# Patient Record
Sex: Male | Born: 1980 | Race: White | Hispanic: No | Marital: Married
Health system: Midwestern US, Academic
[De-identification: ages and names within clinical notes are randomized; demographics above are authoritative.]

---

## 2019-09-19 ENCOUNTER — Encounter: Admit: 2019-09-19 | Discharge: 2019-09-19 | Payer: BC Managed Care – PPO

## 2019-09-19 ENCOUNTER — Ambulatory Visit: Admit: 2019-09-19 | Discharge: 2019-09-19 | Payer: BC Managed Care – PPO

## 2019-09-19 DIAGNOSIS — R1031 Right lower quadrant pain: Secondary | ICD-10-CM

## 2019-09-19 DIAGNOSIS — E119 Type 2 diabetes mellitus without complications: Secondary | ICD-10-CM

## 2019-09-19 DIAGNOSIS — K5090 Crohn's disease, unspecified, without complications: Secondary | ICD-10-CM

## 2019-09-19 DIAGNOSIS — Z20822 Encounter for screening laboratory testing for COVID-19 virus in asymptomatic patient: Secondary | ICD-10-CM

## 2019-09-19 DIAGNOSIS — M797 Fibromyalgia: Secondary | ICD-10-CM

## 2019-09-19 NOTE — Patient Instructions
-   Get labwork completed along with MRI abdomen  - We will call to schedule EGD and colonoscopy (upper and lower scopes)  - Continue bentyl as needed

## 2019-09-21 ENCOUNTER — Encounter: Admit: 2019-09-21 | Discharge: 2019-09-21 | Payer: BC Managed Care – PPO

## 2019-09-21 MED ORDER — PLENVU 140-9-5.2 GRAM PO PPKS
1 | PACK | ORAL | 0 refills | Status: AC
Start: 2019-09-21 — End: ?

## 2019-09-21 NOTE — Telephone Encounter
Review of AVS with patient.  Labs to be drawn locally and mailed orders to patient today.  Order for COVID-19 nasal swab mailed with notation to call [phone] if unable to obtain locally.    Colonoscopy prep instructions mailed to patient.

## 2019-10-01 ENCOUNTER — Encounter: Admit: 2019-10-01 | Discharge: 2019-10-01 | Payer: BC Managed Care – PPO

## 2019-10-02 ENCOUNTER — Encounter: Admit: 2019-10-02 | Discharge: 2019-10-02 | Payer: BC Managed Care – PPO

## 2019-10-02 NOTE — Telephone Encounter
Patient request to our office to have "pre-med" for contrast allergy prior to MR Enterography.

## 2019-10-03 MED ORDER — METHYLPREDNISOLONE 4 MG PO DSPK
ORAL_TABLET | 0 refills | Status: AC
Start: 2019-10-03 — End: ?

## 2019-10-20 MED ORDER — CIMZIA 400 MG/2 ML (200 MG/ML X 2) SC SYKT
400 mg | SUBCUTANEOUS | 5 refills | 28.00000 days | Status: DC
Start: 2019-10-20 — End: 2019-12-14
  Filled 2019-10-24: qty 2, 28d supply, fill #1

## 2019-10-31 ENCOUNTER — Encounter: Admit: 2019-10-31 | Discharge: 2019-10-31 | Payer: Commercial Managed Care - Pharmacy Benefit Manager

## 2019-11-03 ENCOUNTER — Encounter: Admit: 2019-11-03 | Discharge: 2019-11-03 | Payer: Commercial Managed Care - Pharmacy Benefit Manager

## 2019-11-03 DIAGNOSIS — M797 Fibromyalgia: Secondary | ICD-10-CM

## 2019-11-03 DIAGNOSIS — R12 Heartburn: Secondary | ICD-10-CM

## 2019-11-03 DIAGNOSIS — D751 Secondary polycythemia: Secondary | ICD-10-CM

## 2019-11-03 DIAGNOSIS — E039 Hypothyroidism, unspecified: Secondary | ICD-10-CM

## 2019-11-03 DIAGNOSIS — E785 Hyperlipidemia, unspecified: Secondary | ICD-10-CM

## 2019-11-03 DIAGNOSIS — K5090 Crohn's disease, unspecified, without complications: Secondary | ICD-10-CM

## 2019-11-03 DIAGNOSIS — K219 Gastro-esophageal reflux disease without esophagitis: Secondary | ICD-10-CM

## 2019-11-03 DIAGNOSIS — E119 Type 2 diabetes mellitus without complications: Secondary | ICD-10-CM

## 2019-11-05 ENCOUNTER — Encounter: Admit: 2019-11-05 | Discharge: 2019-11-05 | Payer: Commercial Managed Care - Pharmacy Benefit Manager

## 2019-11-05 ENCOUNTER — Ambulatory Visit: Admit: 2019-11-05 | Discharge: 2019-11-05 | Payer: BC Managed Care – PPO

## 2019-11-05 MED ORDER — BREEZA FOR NEUTRAL ABDOMINAL/PELVIC IMAGING PO SOLN
1500 mL | Freq: Once | ORAL | 0 refills | Status: CP
Start: 2019-11-05 — End: ?

## 2019-11-05 MED ORDER — GADOBUTROL 10 MMOL/10 ML (1 MMOL/ML) IV SOLN
9 mL | Freq: Once | INTRAVENOUS | 0 refills | Status: CP
Start: 2019-11-05 — End: ?
  Administered 2019-11-05: 19:00:00 9 mL via INTRAVENOUS

## 2019-11-05 MED ORDER — SODIUM CHLORIDE 0.9 % IJ SOLN
50 mL | Freq: Once | INTRAVENOUS | 0 refills | Status: CP
Start: 2019-11-05 — End: ?

## 2019-11-07 ENCOUNTER — Encounter: Admit: 2019-11-07 | Discharge: 2019-11-07 | Payer: Commercial Managed Care - Pharmacy Benefit Manager

## 2019-11-10 ENCOUNTER — Encounter: Admit: 2019-11-10 | Discharge: 2019-11-11 | Payer: BC Managed Care – PPO

## 2019-11-10 DIAGNOSIS — Z20822 Encounter for screening laboratory testing for COVID-19 virus in asymptomatic patient: Secondary | ICD-10-CM

## 2019-11-12 ENCOUNTER — Encounter: Admit: 2019-11-12 | Discharge: 2019-11-12 | Payer: Commercial Managed Care - Pharmacy Benefit Manager

## 2019-11-12 MED ORDER — PROMETHAZINE 25 MG/ML IJ SOLN
6.25 mg | INTRAVENOUS | 0 refills | Status: CN | PRN
Start: 2019-11-12 — End: ?

## 2019-11-12 MED ORDER — DIPHENHYDRAMINE HCL 50 MG/ML IJ SOLN
12.5-25 mg | Freq: Once | INTRAVENOUS | 0 refills | Status: CN | PRN
Start: 2019-11-12 — End: ?

## 2019-11-12 MED ORDER — ONDANSETRON HCL (PF) 4 MG/2 ML IJ SOLN
4 mg | Freq: Once | INTRAVENOUS | 0 refills | Status: CN | PRN
Start: 2019-11-12 — End: ?

## 2019-11-12 MED ORDER — MEPERIDINE (PF) 25 MG/ML IJ SYRG
12.5 mg | INTRAVENOUS | 0 refills | Status: CN | PRN
Start: 2019-11-12 — End: ?

## 2019-11-13 ENCOUNTER — Encounter: Admit: 2019-11-13 | Discharge: 2019-11-13 | Payer: Commercial Managed Care - Pharmacy Benefit Manager

## 2019-11-13 DIAGNOSIS — E039 Hypothyroidism, unspecified: Secondary | ICD-10-CM

## 2019-11-13 DIAGNOSIS — D751 Secondary polycythemia: Secondary | ICD-10-CM

## 2019-11-13 DIAGNOSIS — E119 Type 2 diabetes mellitus without complications: Secondary | ICD-10-CM

## 2019-11-13 DIAGNOSIS — K5090 Crohn's disease, unspecified, without complications: Secondary | ICD-10-CM

## 2019-11-13 DIAGNOSIS — E785 Hyperlipidemia, unspecified: Secondary | ICD-10-CM

## 2019-11-13 DIAGNOSIS — M797 Fibromyalgia: Secondary | ICD-10-CM

## 2019-11-13 DIAGNOSIS — K219 Gastro-esophageal reflux disease without esophagitis: Secondary | ICD-10-CM

## 2019-11-13 DIAGNOSIS — R12 Heartburn: Secondary | ICD-10-CM

## 2019-11-13 MED ORDER — PROPOFOL 10 MG/ML IV EMUL 50 ML (INFUSION)(AM)(OR)
INTRAVENOUS | 0 refills | Status: DC
Start: 2019-11-13 — End: 2019-11-13

## 2019-11-13 MED ORDER — LIDOCAINE (PF) 10 MG/ML (1 %) IJ SOLN
.2 mL | INTRAMUSCULAR | 0 refills | Status: DC | PRN
Start: 2019-11-13 — End: 2019-11-18

## 2019-11-13 MED ORDER — GLYCOPYRROLATE 0.2 MG/ML IJ SOLN
INTRAVENOUS | 0 refills | Status: DC
Start: 2019-11-13 — End: 2019-11-13

## 2019-11-13 MED ORDER — LACTATED RINGERS IV SOLP
1000 mL | INTRAVENOUS | 0 refills | Status: DC
Start: 2019-11-13 — End: 2019-11-18

## 2019-11-13 NOTE — Anesthesia Pre-Procedure Evaluation
Anesthesia Pre-Procedure Evaluation    Name: Alan [name]      MRN: [id number]     DOB: [birth year]     Age: 39 y.o.     Sex: male   _________________________________________________________________________     Procedure Info:   Procedure Information     Date/Time: 11/13/19 1045    Procedures:       Colonoscopy (N/A )      ESOPHAGOGASTRODUODENOSCOPY WITH BIOPSY - FLEXIBLE (N/A )    Location: [facility] / [id number] OR    Surgeons: Buckles, Vinnie Level, MD          Physical Assessment  Vital Signs (last filed in past 24 hours):         Patient History   Allergies   Allergen Reactions   ? Azathioprine RASH   ? Chlorhexidine RASH   ? Iodine And Iodide Containing Products RASH   ? Chlorphen-Phenyleph-Hydrocodon ITCHING   ? Gadolinium-Containing Contrast Media FLUSHING (SKIN)     Pt reported has had reaction in past x2 at other location. Pt reports was given benadryl with 2nd reaction.      ? Hydrocodone UNKNOWN        Current Medications    Medication Directions   BD ULTRA-FINE SHORT PEN NEEDLE 31 gauge x 5/16 pen needle USE TO INJECT INSULIN 4 TIMES A DAY   cetirizine (ZYRTEC) 10 mg tablet Daily   CIMZIA 400 mg/2 mL (200 mg/mL x 2) syringe Inject 2 mL under the skin every 28 days. Indications: Crohn's disease   dicyclomine (BENTYL) 20 mg tablet TAKE 1 TABLET BY MOUTH 4 TIMES A DAY AS NEEDED FOR BOWEL CRAMPING   diphenhydrAMINE (BENADRYL) 25 mg capsule 1 Cap, QID, PO, PRN, Allergy   FARXIGA 10 mg tablet Take 10 mg by mouth every morning.   HUMALOG KWIKPEN INSULIN 100 unit/mL injection PEN INJECT 20 UNITS SUB Q 3 TIMES DAILY WITH MEALS   HYDROcodone/acetaminophen (NORCO) 5/325 mg tablet TAKE 1 TO 2 TABLETS BY MOUTH EVERY 6 HOURS AS NEEDED FOR PAIN   LANTUS SOLOSTAR U-100 INSULIN 100 unit/mL (3 mL) injection PEN INJECT 50 UNITS SUB Q DAILY AT BEDTIME   levothyroxine (SYNTHROID) 25 mcg tablet    methylPREDNIsolone (MEDROL DOSEPAK) 4 mg tablet 32mg  by mouth 12 hours prior to MRI and 32mg  by mouth 2 hours prior to MRI.  And over the counter Benadryl 50mg  2 hours prior to scan.  MRI requires you to have a driver.   pantoprazole DR (PROTONIX) 40 mg tablet Take 40 mg by mouth daily.   peg3350-sod sul-NaCl-KCl-asb-C (PLENVU) 140-9-5.2 gram ppks Take 1 each by mouth as directed. Split dose   rosuvastatin (CRESTOR) 40 mg tablet Take 40 mg by mouth at bedtime daily.   SAVELLA 50 mg tablet    VASCEPA 1 gram capsule TAKE 2 CAPSULES BY MOUTH TWICE A DAY         Review of Systems/Medical History        PONV Screening: Non-smoker      Airway - negative        Pulmonary - negative          Cardiovascular - negative        Exercise tolerance: >4 METS      Beta Blocker therapy: No      Beta blockers within 24 hours: n/a      GI/Hepatic/Renal       Inflammatory bowel disease      GERD,  Bowel prep      Neuro/Psych - negative          Endocrine/Other       Diabetes    Constitution - negative   PHYSICAL EXAM     Diagnostic Tests  Hematology: No results found for: HGB, HCT, PLTCT, WBC, NEUT, ANC, LYMPH, ALC, ABSLYMPHCT, MONA, AMC, EOSA, ABC, BASOPHILS, MCV, MCH, MCHC, MPV, RDW      General Chemistry: No results found for: NA, K, CL, CO2, GAP, BUN, CR, GLU, CA, KETONES, ALBUMIN, LACTIC, OBSCA, MG, TOTBILI, TOTBILCB, PO4   Coagulation: No results found for: PT, PTT, INR      Anesthesia Plan    ASA score: 2   Plan: MAC      Informed Consent        Plan discussed with: anesthesiologist, CRNA and surgeon/proceduralist.

## 2019-11-14 ENCOUNTER — Encounter: Admit: 2019-11-14 | Discharge: 2019-11-14 | Payer: Commercial Managed Care - Pharmacy Benefit Manager

## 2019-11-14 DIAGNOSIS — E039 Hypothyroidism, unspecified: Secondary | ICD-10-CM

## 2019-11-14 DIAGNOSIS — R12 Heartburn: Secondary | ICD-10-CM

## 2019-11-14 DIAGNOSIS — K5090 Crohn's disease, unspecified, without complications: Secondary | ICD-10-CM

## 2019-11-14 DIAGNOSIS — E785 Hyperlipidemia, unspecified: Secondary | ICD-10-CM

## 2019-11-14 DIAGNOSIS — D751 Secondary polycythemia: Secondary | ICD-10-CM

## 2019-11-14 DIAGNOSIS — K219 Gastro-esophageal reflux disease without esophagitis: Secondary | ICD-10-CM

## 2019-11-14 DIAGNOSIS — E119 Type 2 diabetes mellitus without complications: Secondary | ICD-10-CM

## 2019-11-14 DIAGNOSIS — M797 Fibromyalgia: Secondary | ICD-10-CM

## 2019-11-17 ENCOUNTER — Encounter: Admit: 2019-11-17 | Discharge: 2019-11-17 | Payer: Commercial Managed Care - Pharmacy Benefit Manager

## 2019-11-17 MED FILL — CIMZIA 400 MG/2 ML (200 MG/ML X 2) SC SYKT: 400 mg/2 mL (200 mg/mL x 2) | SUBCUTANEOUS | 28 days supply | Qty: 2 | Fill #2 | Status: AC

## 2019-11-29 ENCOUNTER — Encounter: Admit: 2019-11-29 | Discharge: 2019-11-29 | Payer: Commercial Managed Care - Pharmacy Benefit Manager

## 2019-11-29 DIAGNOSIS — K5090 Crohn's disease, unspecified, without complications: Secondary | ICD-10-CM

## 2019-11-29 DIAGNOSIS — Z5181 Encounter for therapeutic drug level monitoring: Secondary | ICD-10-CM

## 2019-11-29 NOTE — Telephone Encounter
-----   Message from Elmo Putt, MD sent at 11/28/2019  3:45 PM CDT -----  Spoke with the patient. He continues to have symptoms of abdominal pain, primarily. We discussed his endoscopy and pathology results. We spoke about transitioning him to another class of biologic medication, particularly stelara. He is agreeable. He would need HbsAb, TB spot, CXR. He would like to get labwork and tests done at [facility] if possible. He is also in the middle of transitioning insurance and is concerned about coverage/payment for the tests and medications until his new insurance takes over in 1-2 months.  ----- Message -----  From: Buckles, Vinnie Level, MD  Sent: 11/17/2019  12:17 PM CDT  To: Myrlene Broker, RN, Elmo Putt, MD    Biopsies show some active Ileitis    Dr. Romeo Apple,  Pt has follow up appointment. Do you want to discuss with him switching to Stelara? It seems he has ost response to Humira and Cimzia.

## 2019-11-29 NOTE — Telephone Encounter
Faxed to [facility] [phone]

## 2019-11-29 NOTE — Telephone Encounter
Patient aware of efforts in sending all lab orders and CXR order to [facility] in [place], [place]  Fax is [phone].  Patient does not plan on having completed until can get confirmation regarding Wife's new insurance anticipating 1 1/2 - 2 months from now.  Asked for patient to let us know once completed to ensure we get results.

## 2019-12-13 ENCOUNTER — Encounter: Admit: 2019-12-13 | Discharge: 2019-12-13 | Payer: PRIVATE HEALTH INSURANCE

## 2019-12-14 ENCOUNTER — Encounter: Admit: 2019-12-14 | Discharge: 2019-12-14 | Payer: PRIVATE HEALTH INSURANCE

## 2019-12-14 MED ORDER — CIMZIA 400 MG/2 ML (200 MG/ML X 2) SC SYKT
400 mg | SUBCUTANEOUS | 5 refills | 28.00000 days | Status: AC
Start: 2019-12-14 — End: ?

## 2019-12-14 NOTE — Progress Notes
Alan [name] specialty medication Alan [name] has been approved but is mandated to [employer] by their insurance. A new prescription has been sent and will be routed to provider for cosignature.    Called patient and discussed the above. His next fill is due ~6/28. He will plan on contact [facility] soon to arrange the fill.     Mliss Fritz, PHARMD

## 2019-12-14 NOTE — Progress Notes
Johnmatthew Silsby's prescription for Cimzia is not able to be filled by [facility] due to the patient's prescription insurance.  Pharmacy has contacted Terese Door to notify them that their prescription will be sent to [facility] ([phone]) as required by their prescription insurance.  The specialty pharmacy team has also notified the ambulatory clinical pharmacist by e-mail.    Nena Jordan  Pharmacy Patient Advocate  [phone]

## 2019-12-18 ENCOUNTER — Encounter: Admit: 2019-12-18 | Discharge: 2019-12-18 | Payer: PRIVATE HEALTH INSURANCE

## 2019-12-19 ENCOUNTER — Encounter: Admit: 2019-12-19 | Discharge: 2019-12-19 | Payer: PRIVATE HEALTH INSURANCE

## 2019-12-19 MED ORDER — DICYCLOMINE 20 MG PO TAB
20 mg | ORAL_TABLET | Freq: Four times a day (QID) | ORAL | 0 refills | Status: DC | PRN
Start: 2019-12-19 — End: 2020-01-10

## 2019-12-19 MED ORDER — CERTOLIZUMAB PEGOL 400 MG/2 ML (200 MG/ML X 2) SC SYKT
400 mg | SUBCUTANEOUS | 3 refills | Status: CN
Start: 2019-12-19 — End: ?

## 2019-12-19 NOTE — Progress Notes
Per O2 message from patient, [facility] stating that PA is needed for Cimzia. [facility] attempted to process PA earlier this month with insurance but was told this wasn't needed.     Will send new Rx to [facility] to trouble shoot and attempt another PA.    Mliss Fritz, PHARMD

## 2019-12-19 NOTE — Progress Notes
The Prior Authorization for Cimzia was submitted for Alan [name] via Cover My Meds.  Will continue to follow.    Nena Jordan  Pharmacy Patient Advocate  [phone]

## 2019-12-19 NOTE — Progress Notes
The Prior Authorization for Cimzia was approved for Alan [name] from 12/19/2019 to 12/18/2020. The PA authorization number is [id number].    Alan [name]'s prescription for Cimzia is not able to be filled by [facility] due to the patient's prescription insurance.  Pharmacy has contacted Terese Door to notify them that their prescription will be sent to [facility] [phone]) as required by their prescription insurance.  The specialty pharmacy team has also notified the ambulatory clinical pharmacist by e-mail.    Nena Jordan  Pharmacy Patient Advocate  [phone]

## 2019-12-26 ENCOUNTER — Encounter: Admit: 2019-12-26 | Discharge: 2019-12-26 | Payer: Commercial Managed Care - HMO

## 2019-12-26 ENCOUNTER — Ambulatory Visit: Admit: 2019-12-26 | Discharge: 2019-12-27 | Payer: Commercial Managed Care - HMO

## 2019-12-26 DIAGNOSIS — M797 Fibromyalgia: Secondary | ICD-10-CM

## 2019-12-26 DIAGNOSIS — E039 Hypothyroidism, unspecified: Secondary | ICD-10-CM

## 2019-12-26 DIAGNOSIS — K5090 Crohn's disease, unspecified, without complications: Secondary | ICD-10-CM

## 2019-12-26 DIAGNOSIS — E785 Hyperlipidemia, unspecified: Secondary | ICD-10-CM

## 2019-12-26 DIAGNOSIS — R12 Heartburn: Secondary | ICD-10-CM

## 2019-12-26 DIAGNOSIS — E119 Type 2 diabetes mellitus without complications: Secondary | ICD-10-CM

## 2019-12-26 DIAGNOSIS — K219 Gastro-esophageal reflux disease without esophagitis: Secondary | ICD-10-CM

## 2019-12-26 DIAGNOSIS — D751 Secondary polycythemia: Secondary | ICD-10-CM

## 2019-12-26 MED ORDER — BUDESONIDE 3 MG PO CECX
9 mg | ORAL_CAPSULE | Freq: Every morning | ORAL | 0 refills | 30.00000 days | Status: AC
Start: 2019-12-26 — End: ?

## 2019-12-26 NOTE — Patient Instructions
-   let us know when you get labs and chest x ray  - entocort (budesonide) has been sent to your pharmacy. Take 9mg  daily for 8 weeks

## 2020-01-05 ENCOUNTER — Encounter: Admit: 2020-01-05 | Discharge: 2020-01-05 | Payer: Commercial Managed Care - HMO

## 2020-01-10 ENCOUNTER — Encounter: Admit: 2020-01-10 | Discharge: 2020-01-10 | Payer: Commercial Managed Care - HMO

## 2020-01-10 MED ORDER — DICYCLOMINE 20 MG PO TAB
20 mg | ORAL_TABLET | Freq: Four times a day (QID) | ORAL | 0 refills | Status: AC | PRN
Start: 2020-01-10 — End: ?

## 2020-01-16 ENCOUNTER — Encounter: Admit: 2020-01-16 | Discharge: 2020-01-16 | Payer: Commercial Managed Care - HMO

## 2020-01-31 ENCOUNTER — Encounter: Admit: 2020-01-31 | Discharge: 2020-01-31 | Payer: Commercial Managed Care - HMO

## 2020-01-31 NOTE — Telephone Encounter
Request for 01/29/2020 chest x-ray, hepatitis serology, t spot from [facility] per patient message to our office had completed.

## 2020-02-01 ENCOUNTER — Encounter: Admit: 2020-02-01 | Discharge: 2020-02-01 | Payer: Commercial Managed Care - HMO

## 2020-02-01 DIAGNOSIS — K5090 Crohn's disease, unspecified, without complications: Secondary | ICD-10-CM

## 2020-02-01 DIAGNOSIS — Z5181 Encounter for therapeutic drug level monitoring: Secondary | ICD-10-CM

## 2020-02-01 LAB — T SPOT TB (QUANTIFERON TB)

## 2020-02-01 MED ORDER — USTEKINUMAB 90 MG/ML SC SYRG
90 mg | SUBCUTANEOUS | 6 refills | Status: CN
Start: 2020-02-01 — End: ?

## 2020-02-01 MED ORDER — USTEKINUMAB IVPB
520 mg | Freq: Once | INTRAVENOUS | 0 refills
Start: 2020-02-01 — End: ?

## 2020-02-02 ENCOUNTER — Encounter: Admit: 2020-02-02 | Discharge: 2020-02-02 | Payer: Commercial Managed Care - HMO

## 2020-02-02 MED ORDER — FLUCONAZOLE 100 MG PO TAB
100 mg | ORAL_TABLET | Freq: Every day | ORAL | 0 refills | 7.00000 days | Status: AC
Start: 2020-02-02 — End: ?

## 2020-02-02 NOTE — Progress Notes
The Prior Authorization for Alan [name] was approved for Alan [name] from 02/02/20 to 02/01/21.  The PA authorization number is [id number].    Masashi Waid's prescription for Alan [name] is not able to be filled by [facility] due to the patient's prescription insurance.  Pharmacy has contacted Terese Door to notify them that their prescription will be sent to [facility] [phone]) as required by their prescription insurance.  The specialty pharmacy team has also notified the ambulatory clinical pharmacist by email.    Fredda Hammed  Pharmacy Patient Advocate  [phone]

## 2020-02-12 ENCOUNTER — Encounter: Admit: 2020-02-12 | Discharge: 2020-02-12 | Payer: Commercial Managed Care - HMO

## 2020-02-12 DIAGNOSIS — K5090 Crohn's disease, unspecified, without complications: Secondary | ICD-10-CM

## 2020-02-12 DIAGNOSIS — R1031 Right lower quadrant pain: Secondary | ICD-10-CM

## 2020-02-12 NOTE — Progress Notes
Specialty Medication Education      Dillion Kuethe was contacted to provide medication education on their new specialty medication.    Davondre Steinberg was educated on the brand and generic medication name(s) (STELARA 90 MG/ML SC SYRG) along with the medication class. The indication for treatment, dose, route, frequency, and duration were discussed with the patient. The patient was educated on timely administration of therapy and management of missed doses. Adherence with therapy was discussed with the patient. The patient's ability to be adherent with drug therapies was discussed and the patient was provided options for tools/resources that promote adherence to therapy.    The patient's ability to self-administer the medication was assessed.  The patient was educated on proper administration/injection technique for their therapy and verbalized acceptance and understanding. Appropriate safe-handling, storage, and disposal directions were reviewed with the patient.     Contraindications to therapy, safety precautions, and common side effects were discussed with the patient. They were instructed to seek medical attention immediately if they experience signs of an allergic reaction, including but not limited to: a rash; hives; itching; red, swollen, blistered, or peeling skin with or without fever.     Requirements of the REMS program were discussed with the patient as applicable.     Medication history and reconciliation were performed (including prescription medications, supplements, over the counter, and herbal products). The medication list was updated and the patient's current medication list is listed below.     Home Medications    Medication Sig   BD ULTRA-FINE SHORT PEN NEEDLE 31 gauge x 5/16 pen needle USE TO INJECT INSULIN 4 TIMES A DAY   budesonide (ENTOCORT EC) 3 mg capsule Take three capsules by mouth every morning for 60 days. Indications: Crohn's disease   cetirizine (ZYRTEC) 10 mg tablet Daily dicyclomine (BENTYL) 20 mg tablet TAKE ONE TABLET BY MOUTH FOUR TIMES DAILY AS NEEDED   diphenhydrAMINE (BENADRYL) 25 mg capsule 1 Cap, QID, PO, PRN, Allergy   FARXIGA 10 mg tablet Take 10 mg by mouth every morning.   fluconazole (DIFLUCAN) 100 mg tablet Take one tablet by mouth daily for 14 days. Indications: candida   HUMALOG KWIKPEN INSULIN 100 unit/mL injection PEN INJECT 20 UNITS SUB Q 3 TIMES DAILY WITH MEALS   LANTUS SOLOSTAR U-100 INSULIN 100 unit/mL (3 mL) injection PEN INJECT 50 UNITS SUB Q DAILY AT BEDTIME   levothyroxine (SYNTHROID) 25 mcg tablet    methylPREDNIsolone (MEDROL DOSEPAK) 4 mg tablet 32mg  by mouth 12 hours prior to MRI and 32mg  by mouth 2 hours prior to MRI.  And over the counter Benadryl 50mg  2 hours prior to scan.  MRI requires you to have a driver.   pantoprazole DR (PROTONIX) 40 mg tablet Take 40 mg by mouth daily.   peg3350-sod sul-NaCl-KCl-asb-C (PLENVU) 140-9-5.2 gram ppks Take 1 each by mouth as directed. Split dose   rosuvastatin (CRESTOR) 40 mg tablet Take 40 mg by mouth at bedtime daily.   SAVELLA 50 mg tablet    ustekinumab (STELARA) 90 mg syringe Inject 1 mL under the skin every 8 weeks. Indications: Crohn's disease   VASCEPA 1 gram capsule TAKE 2 CAPSULES BY MOUTH TWICE A DAY      Drug-drug and drug-food interactions with the new therapy were assessed and reviewed with the patient. The patient was instructed to speak with their health care provider before starting any new drug, including prescription or over the counter, natural / herbal products, or vitamins.    Pregnancy Status: Male,  education not applicable.    Appropriate recommended vaccinations were reviewed and discussed with the patient.    The monitoring and follow-up plan was discussed with the patient. The patient was instructed to contact their health care provider if their symptoms or health problems do not get better or if they become worse. Darald Uzzle was instructed to contact the specialty pharmacy at [phone] if they have any questions or concerns regarding their medication therapy.     Follow up Plan:      The patient was counseled via telephone.    The patient was provided the following type of education: full initial    10 minutes were spent educating the patient.    Jerrico Covello was given the opportunity to ask questions and did not have any questions at the time.    The medication(s) will be received from an outside pharmacy ([facility]) based on insurance mandate.    Mliss Fritz, PHARMD

## 2020-02-12 NOTE — Progress Notes
Pharmacy Medication Initial Assessment      Indication/Regimen     The regimen of STELARA 90 MG/ML SC SYRG indefinitely is appropriate for Alan [name] who has Crohn's disease involving terminal ileum (HCC).    Renal dose adjustments are not required. Hepatic dose adjustments are not required.   Dose titration is required.    The dose of 90mg  subcutaneously every 8 weeks will start 8 weeks after the intravenous (IV) induction dose has been completed. The patient will receive the IV induction dose of 520mg  on.    The patient has the ability to self-administer the medication(s).    Baseline Characteristics:    Medications being used concurrently: None.  Previously trialed agents: Cimzia, Humira, azathioprine.  Allergy and/or intolerance to IBD medications: azathioprine (pancreatitis), Humira (antibodies).  Baseline endoscopy: Colonoscopy 10/2019 showed multiple ulcers in the terminal ileum.  Baseline pathology: Pathology 10/2019 confirmed active ileitis.    Disease Description and Activity    Patient reported level of disease activity: often active  Disease severity: moderate-severe  Disease description: ileitis  Clinical Symptoms: diarrhea, constipation, urgency and bloating    Response to Therapy and Therapeutic Goals:        Clinical remission: This is a new therapeutic goal for [employer].    Endoscopic remission: This is a new therapeutic goal for [employer].     Histologic remission: This is a new therapeutic goal for [employer].    Past Medical History:  Medical History:   Diagnosis Date   ? Acid reflux    ? Crohn's disease (HCC)    ? Fibromyalgia    ? Heartburn    ? Hyperlipidemia    ? Hypothyroid    ? Polycythemia, secondary    ? Type II diabetes mellitus (HCC)      Labs and Diagnostic Tests:  No results found for: RBC, HGB, HCT, MCV, MCH, MCHC, RDW, PLTCT, MPV  No results found for: TSPOTTB, QUANTIFERTB  No results found for: HEPBSAG, HEPBEAG, HEPBSURABQT, HEPBSAB   Wt Readings from Last 1 Encounters:   11/13/19 94 kg (207 lb 3.7 oz)     Outside HepB/TB negative    Allergies:   Allergies   Allergen Reactions   ? Azathioprine RASH   ? Chlorhexidine RASH   ? Iodine And Iodide Containing Products RASH   ? Chlorphen-Phenyleph-Hydrocodon ITCHING   ? Gadolinium-Containing Contrast Media FLUSHING (SKIN)     Pt reported has had reaction in past x2 at other location. Pt reports was given benadryl with 2nd reaction.           Immunizations:   Vaccine history was reviewed with the patient. Education will be provided on the importance of completing vaccines, including annual influenza vaccine.      There is no immunization history on file for this patient.    Medication Reconciliation:  Medication reconciliation is based on the patient?s most recent med list in electronic medical record (EMR) including herbal products and OTC medications. The patient's medication list will be updated during patient education, after speaking with the patient and prior to dispensing the medication.    Home Medications    Medication Sig   BD ULTRA-FINE SHORT PEN NEEDLE 31 gauge x 5/16 pen needle USE TO INJECT INSULIN 4 TIMES A DAY   budesonide (ENTOCORT EC) 3 mg capsule Take three capsules by mouth every morning for 60 days. Indications: Crohn's disease   certolizumab pegoL (CIMZIA) 400 mg/2 mL (200 mg/mL x 2) syringe Inject 2 mL  under the skin every 28 days.   cetirizine (ZYRTEC) 10 mg tablet Daily   CIMZIA 400 mg/2 mL (200 mg/mL x 2) syringe Inject 2 mL under the skin every 28 days. Indications: Crohn's disease   dicyclomine (BENTYL) 20 mg tablet TAKE ONE TABLET BY MOUTH FOUR TIMES DAILY AS NEEDED   diphenhydrAMINE (BENADRYL) 25 mg capsule 1 Cap, QID, PO, PRN, Allergy   FARXIGA 10 mg tablet Take 10 mg by mouth every morning.   fluconazole (DIFLUCAN) 100 mg tablet Take one tablet by mouth daily for 14 days. Indications: candida   HUMALOG KWIKPEN INSULIN 100 unit/mL injection PEN INJECT 20 UNITS SUB Q 3 TIMES DAILY WITH MEALS LANTUS SOLOSTAR U-100 INSULIN 100 unit/mL (3 mL) injection PEN INJECT 50 UNITS SUB Q DAILY AT BEDTIME   levothyroxine (SYNTHROID) 25 mcg tablet    methylPREDNIsolone (MEDROL DOSEPAK) 4 mg tablet 32mg  by mouth 12 hours prior to MRI and 32mg  by mouth 2 hours prior to MRI.  And over the counter Benadryl 50mg  2 hours prior to scan.  MRI requires you to have a driver.   pantoprazole DR (PROTONIX) 40 mg tablet Take 40 mg by mouth daily.   peg3350-sod sul-NaCl-KCl-asb-C (PLENVU) 140-9-5.2 gram ppks Take 1 each by mouth as directed. Split dose   rosuvastatin (CRESTOR) 40 mg tablet Take 40 mg by mouth at bedtime daily.   SAVELLA 50 mg tablet    ustekinumab (STELARA) 90 mg syringe Inject 1 mL under the skin every 8 weeks. Indications: Crohn's disease   VASCEPA 1 gram capsule TAKE 2 CAPSULES BY MOUTH TWICE A DAY     Drug Interactions:   Drug interactions were evaluated.There were not clinically significant drug interactions.     Drug-Food Interactions:  Drug-Food interactions were evaluated. There are no significant drug-food interactions. This medication should be taken NA - injectable.     Safety Precautions:     Risk Evaluation and Mitigation (REMS) Assessment:  REMS is not required for this medication.    Safety precautions were addressed and discussed with the patient as applicable.    Contraindications: Alan [name] does not have contraindications to this medication.      Infection Screening:  The patient has completed screening for TB and Hepatitis B.    Pregnancy Status: Male, education not applicable.      Follow-up Plan:  The initial therapy assessment has been completed and the patient will be contacted to complete education on their regimen.

## 2020-02-25 ENCOUNTER — Encounter: Admit: 2020-02-25 | Discharge: 2020-02-25 | Payer: Commercial Managed Care - HMO

## 2020-02-25 DIAGNOSIS — K5090 Crohn's disease, unspecified, without complications: Secondary | ICD-10-CM

## 2020-02-25 MED ORDER — BUDESONIDE 3 MG PO CECX
ORAL_CAPSULE | 1 refills
Start: 2020-02-25 — End: ?

## 2020-02-26 ENCOUNTER — Encounter: Admit: 2020-02-26 | Discharge: 2020-02-26 | Payer: Commercial Managed Care - HMO

## 2020-02-26 MED ORDER — BUDESONIDE 3 MG PO CECX
9 mg | ORAL_CAPSULE | Freq: Every day | ORAL | 0 refills | 30.00000 days | Status: AC
Start: 2020-02-26 — End: ?

## 2020-02-26 MED ORDER — DICYCLOMINE 20 MG PO TAB
20 mg | ORAL_TABLET | Freq: Four times a day (QID) | ORAL | 0 refills | Status: AC | PRN
Start: 2020-02-26 — End: ?

## 2020-02-27 ENCOUNTER — Encounter: Admit: 2020-02-27 | Discharge: 2020-02-27 | Payer: Commercial Managed Care - HMO

## 2020-03-18 ENCOUNTER — Encounter: Admit: 2020-03-18 | Discharge: 2020-03-18 | Payer: Commercial Managed Care - HMO

## 2020-03-18 MED ORDER — DICYCLOMINE 20 MG PO TAB
20 mg | ORAL_TABLET | Freq: Four times a day (QID) | ORAL | 0 refills | Status: AC | PRN
Start: 2020-03-18 — End: ?

## 2020-04-18 ENCOUNTER — Encounter: Admit: 2020-04-18 | Discharge: 2020-04-18 | Payer: Commercial Managed Care - HMO

## 2020-04-18 MED ORDER — BUDESONIDE 3 MG PO CECX
ORAL_CAPSULE | Freq: Every day | ORAL | 0 refills | 30.00000 days | Status: AC
Start: 2020-04-18 — End: ?

## 2020-04-18 NOTE — Telephone Encounter
Request for patient to provide update as unable to see Stelara induction dosing infusion has been completed and scheduling of follow-up office.

## 2020-05-13 ENCOUNTER — Encounter: Admit: 2020-05-13 | Discharge: 2020-05-13 | Payer: Commercial Managed Care - HMO

## 2020-05-13 ENCOUNTER — Ambulatory Visit: Admit: 2020-05-13 | Discharge: 2020-05-14 | Payer: Commercial Managed Care - HMO

## 2020-05-13 DIAGNOSIS — K5090 Crohn's disease, unspecified, without complications: Secondary | ICD-10-CM

## 2020-05-13 DIAGNOSIS — K5000 Crohn's disease of small intestine without complications: Secondary | ICD-10-CM

## 2020-05-13 DIAGNOSIS — M797 Fibromyalgia: Secondary | ICD-10-CM

## 2020-05-13 DIAGNOSIS — R12 Heartburn: Secondary | ICD-10-CM

## 2020-05-13 DIAGNOSIS — K219 Gastro-esophageal reflux disease without esophagitis: Secondary | ICD-10-CM

## 2020-05-13 DIAGNOSIS — E119 Type 2 diabetes mellitus without complications: Secondary | ICD-10-CM

## 2020-05-13 DIAGNOSIS — D751 Secondary polycythemia: Secondary | ICD-10-CM

## 2020-05-13 DIAGNOSIS — E785 Hyperlipidemia, unspecified: Secondary | ICD-10-CM

## 2020-05-13 DIAGNOSIS — E039 Hypothyroidism, unspecified: Secondary | ICD-10-CM

## 2020-05-13 MED ORDER — USTEKINUMAB IVPB
520 mg | Freq: Once | INTRAVENOUS | 0 refills | Status: CP
Start: 2020-05-13 — End: ?
  Administered 2020-05-13: 22:00:00 520 mg via INTRAVENOUS

## 2020-05-13 MED ORDER — USTEKINUMAB IVPB
520 mg | Freq: Once | INTRAVENOUS | 0 refills | Status: CN
Start: 2020-05-13 — End: ?

## 2020-05-13 NOTE — Patient Instructions
Stelara Injection 5 mg/mL  Uses  This medicine is used for the following purposes:  ? arthritis  ? inflammatory bowel disease  ? psoriasis  Instructions  This is an IV medicine. It is given through a sterile tube directly into the vein by a healthcare provider.  Store new medicine in the refrigerator until you are ready to use it. Do not allow them to freeze.  Keep the medicine in its original container.  Protect medicine from light.  Take the medicine out of the refrigerator about 30 minutes before use to warm to room temperature.  Injecting cold drug may be uncomfortable.  This medicine should be given by a trained health care provider.  It may take several weeks for this medicine to fully work.  Please tell your doctor and pharmacist about all the medicines you take. Include both prescription and over-the-counter medicines. Also tell them about any vitamins, herbal medicines, or anything else you take for your health.  If your symptoms do not improve or they worsen while on this medicine, contact your doctor.  Talk to your doctor before taking other medicines, including aspirins and ibuprofen containing products. Speak to your doctor about which medicines are safe to use while you are on this medicine.  It is very important that you follow your doctor's instructions for all blood tests.  It is very important that you keep all appointments for medical exams and tests while on this medicine.  Cautions  Tell your doctor and pharmacist if you ever had an allergic reaction to a medicine. Symptoms of an allergic reaction can include trouble breathing, skin rash, itching, swelling, or severe dizziness.  Your doctor should check you for tuberculosis (TB) before you start this medicine and while you are using it. Tell your doctor if you are being treated for TB or any other infection.  Some patients on this medicine have developed severe, life-threatening infections. Please speak with your doctor about the risks and benefits of using this medicine.  Some patients taking this medicine have experienced serious side effects. Please speak with your doctor to understand the risks and benefits associated with this medicine.  Call the doctor if there are any signs of confusion or unusual changes in behavior.  This medicine may reduce your body's ability to fight infections. Try to avoid contact with people with colds, flu or other infections.  Contact your doctor if you develop any signs of a new infection such as fever, cough, sore throat, or chills.  Wash your hands often and avoid close contact with people with infections such as colds and flu.  Speak with your health care provider before receiving any vaccinations.  Tell the doctor or pharmacist if you are pregnant, planning to be pregnant, or breastfeeding.  Ask your pharmacist if this medicine can interact with any of your other medicines. Be sure to tell them about all the medicines you take.  Please tell all your doctors and dentists that you are on this medicine before they provide care.  Do not start or stop any other medicines without first speaking to your doctor or pharmacist.  Call your doctor right away if you notice any unusual bleeding or bruising.  This medicine can cause serious side effects in some patients. Important information from the [place] Food and Drug Administration (FDA) is available from your pharmacist. Please review it carefully with your pharmacist to understand the risks associated with this medicine.  Side Effects  The following is a list of some  common side effects from this medicine. Please speak with your doctor about what you should do if you experience these or other side effects.  ? back pain  ? unusual bruising or discoloration on skin  ? lack of energy and tiredness  ? headaches  ? reaction at the area of the injection (pain, redness, swelling)  ? itching  ? sore throat  ? upper respiratory infection  Call your doctor or get medical help right away if you notice any of these more serious side effects:  ? increased risk of cancer  ? confusion  ? cough that does not go away  ? fever or chills  ? flu-like symptoms  ? severe or persistent headache  ? muscle pain  ? pain near injection site  ? seizures  ? shortness of breath  ? skin changes - brown or black area with uneven edges or coloring  ? change in the size or color of a skin mole  ? skin sores  ? stomach pain  ? unusual or unexplained tiredness or weakness  ? unsteadiness while walking  ? increased urinary frequency  ? difficulty or discomfort urinating  ? blurring or changes of vision  ? severe or persistent vomiting  ? weight loss  A few people may have an allergic reaction to this medicine. Symptoms can include difficulty breathing, skin rash, itching, swelling, or severe dizziness. If you notice any of these symptoms, seek medical help quickly.  Extra  Please speak with your doctor, nurse, or pharmacist if you have any questions about this medicine.  [email or web address]  IMPORTANT NOTE: This document tells you briefly how to take your medicine, but it does not tell you all there is to know about it.Your doctor or pharmacist may give you other documents about your medicine. Please talk to them if you have any questions.Always follow their advice. There is a more complete description of this medicine available in [place].Scan this code on your smartphone or tablet or use the web address below. You can also ask your pharmacist for a printout. If you have any questions, please ask your pharmacist.   ? 2021 [employer]

## 2020-05-13 NOTE — Progress Notes
Pt tolerated Stelara infusion; no reaction noted.

## 2020-05-16 ENCOUNTER — Encounter: Admit: 2020-05-16 | Discharge: 2020-05-16 | Payer: Commercial Managed Care - HMO

## 2020-06-10 ENCOUNTER — Encounter: Admit: 2020-06-10 | Discharge: 2020-06-10 | Payer: Commercial Managed Care - HMO

## 2020-06-10 MED ORDER — USTEKINUMAB 90 MG/ML SC SYRG
90 mg | SUBCUTANEOUS | 6 refills | 38.50000 days | Status: AC
Start: 2020-06-10 — End: ?

## 2020-06-10 NOTE — Progress Notes
Pharmacy Medication Re-assessment    Indication/Regimen  The regimen of STELARA SC indefinitely is appropriate for Alan [name] who has Crohn's disease involving terminal ileum (HCC).    Renal dose adjustments are not required. Hepatic dose adjustments are not required.   Dose titration is not required.    The patient has the ability to self-administer the medication(s).    Therapeutic Goals and Monitoring  The goal of therapy is to control symptoms and achieve remission.    Clinical Remission Assessment  Disease activity (patient reported): constantly active  Patient reported improvement: 0-24 percent improvement in symptoms since starting therapy  Clinical symptoms: abdominal pain, diarrhea, urgency and constipation  Bowel movements per day: 7-9  Steroid course in past 90 days: yes (On Budesonide until first injection)  Hospitalization visit for IBD (past 90 days): 0  ED visit for IBD (past 90 days): 0  Patient is not currently in clinical remission. Patient denies changes in symptoms since infusion; Reports general abdominal pain at most times, fluctuates between diarrhea, constipation, and normal. States usually has a BM very quickly     Endoscopic Evaluation  Endoscopic procedure completed: no (no updated endoscopic procedure)    Histologic Evaluation  Test completed: no (no updated biopsy)    Evaluation  The patient is making progress toward achieving their therapeutic goal. The plan is to continue current therapy. Too early to determine benefit. Will move forward with injections and continue to reassess efficacy    Labs and Diagnostic Tests  No results found for: TSPOTTB, QUANTIFERTB  No results found for: HEPBSAG, HEPBEAG, HEPBSURABQT, HEPBSAB   No results found for: RBC, HGB, HCT, MCV, MCH, MCHC, RDW, PLTCT, MPV    Allergies  Allergies   Allergen Reactions   ? Azathioprine RASH   ? Chlorhexidine RASH   ? Iodine And Iodide Containing Products RASH   ? Chlorphen-Phenyleph-Hydrocodon ITCHING   ? Gadolinium-Containing Contrast Media FLUSHING (SKIN)     Pt reported has had reaction in past x2 at other location. Pt reports was given benadryl with 2nd reaction.           Immunizations  Vaccine history was reviewed with the patient. Education was provided on the importance of completing vaccines.    Immunization History   Administered Date(s) Administered   ? COVID-19 (PFIZER), mRNA vacc, 30 mcg/0.3 mL (PF) 02/28/2020       Medication Reconciliation  Medication history and reconciliation were performed (including prescription medications, supplements, over the counter, and herbal products). The medication list was updated and the patient's current medication list is included below.     Home Medications    Medication Sig   BD ULTRA-FINE SHORT PEN NEEDLE 31 gauge x 5/16 pen needle USE TO INJECT INSULIN 4 TIMES A DAY   budesonide (ENTOCORT EC) 3 mg capsule TAKE 3 CAPSULES BY MOUTH EVERY DAY FOR 56 DAYS   cetirizine (ZYRTEC) 10 mg tablet Daily   dicyclomine (BENTYL) 20 mg tablet TAKE ONE TABLET BY MOUTH FOUR TIMES DAILY AS NEEDED   diphenhydrAMINE (BENADRYL) 25 mg capsule 1 Cap, QID, PO, PRN, Allergy   duloxetine DR (CYMBALTA) 30 mg capsule Take 30 mg by mouth daily.   FARXIGA 10 mg tablet Take 10 mg by mouth every morning.   HUMALOG KWIKPEN INSULIN 100 unit/mL injection PEN INJECT 20 UNITS SUB Q 3 TIMES DAILY WITH MEALS   LANTUS SOLOSTAR U-100 INSULIN 100 unit/mL (3 mL) injection PEN INJECT 50 UNITS SUB Q DAILY AT BEDTIME   levothyroxine (SYNTHROID)  25 mcg tablet    LORazepam (ATIVAN) 1 mg tablet Take 1 mg by mouth as Needed for Nausea, Vomiting or Other....   methylPREDNIsolone (MEDROL DOSEPAK) 4 mg tablet 32mg  by mouth 12 hours prior to MRI and 32mg  by mouth 2 hours prior to MRI.  And over the counter Benadryl 50mg  2 hours prior to scan.  MRI requires you to have a driver.   pantoprazole DR (PROTONIX) 40 mg tablet Take 40 mg by mouth daily.   [id number] sul-NaCl-KCl-asb-C (PLENVU) 140-9-5.2 gram ppks Take 1 each by mouth as directed. Split dose   rosuvastatin (CRESTOR) 40 mg tablet Take 40 mg by mouth at bedtime daily.   SAVELLA 50 mg tablet    ustekinumab (STELARA) 90 mg syringe Inject 1 mL under the skin every 8 weeks. Indications: Crohn's disease   VASCEPA 1 gram capsule TAKE 2 CAPSULES BY MOUTH TWICE A DAY     The patient was reminded to speak with their health care provider before starting any new drug, including prescription or over the counter, natural / herbal products, or vitamins.    Drug Interactions    Drug-Drug Interactions  Drug-drug interactions were evaluated. There were not clinically significant drug-drug interactions.     Drug-Food Interactions  Drug-food interactions were evaluated. There are not clinically significant drug-food interactions. Stelara should be taken with or without food.    Adverse Drug Events  Adverse drug events were reviewed with the patient.  The patient does not have infection related concerns.    Significant adverse drug event(s) were not identified.    Adherence  Refill and adherence history were reviewed with the patient. The patient was educated on the importance of adherence.    Patient is adherent with refills: not due for refill  Patient is meeting refill adherence goal: cannot be assessed    Patient reported 0 missed doses over the past 30 days.   Patient is meeting reported adherence goal.    Safety Precautions    Risk Evaluation and Mitigation (REMS) Assessment: REMS is not required for this medication.    Safety precautions were addressed and discussed with the patient as applicable.    Contraindications: Alan [name] does not have contraindications to this medication.      Pregnancy Status: Male, education not applicable.    Follow-up Plan  The patient was counseled via telephone.  The patient was provided the following type of education: other (Coordinating next dose, injection technique review)  10 minutes were spent educating the patient.    Alan [name] was given the opportunity to ask questions and did not have any questions at the time. Patient was reminded of the refill process and encouraged to call with questions.    The patient will be reassessed within 60 days.    The medication(s) will be received from an outside pharmacy ([facility]) based on insurance mandate.    Benedetto Coons, PHARMD

## 2020-07-31 ENCOUNTER — Encounter: Admit: 2020-07-31 | Discharge: 2020-07-31 | Payer: Commercial Managed Care - HMO

## 2020-07-31 NOTE — Telephone Encounter
Attempted to call Alan [name] to verify compliance and assess tolerance of his specialty medications (Stelara). No answer. Left voicemail asking patient to return call to pharmacist at [phone].    Benedetto Coons, PHARMD

## 2020-09-10 ENCOUNTER — Encounter: Admit: 2020-09-10 | Discharge: 2020-09-10 | Payer: Commercial Managed Care - HMO

## 2020-09-12 ENCOUNTER — Encounter: Admit: 2020-09-12 | Discharge: 2020-09-12 | Payer: Commercial Managed Care - HMO

## 2020-09-12 ENCOUNTER — Ambulatory Visit: Admit: 2020-09-12 | Discharge: 2020-09-13 | Payer: Commercial Managed Care - HMO

## 2020-09-12 DIAGNOSIS — R1310 Dysphagia, unspecified: Secondary | ICD-10-CM

## 2020-09-12 DIAGNOSIS — E785 Hyperlipidemia, unspecified: Secondary | ICD-10-CM

## 2020-09-12 DIAGNOSIS — M797 Fibromyalgia: Secondary | ICD-10-CM

## 2020-09-12 DIAGNOSIS — K219 Gastro-esophageal reflux disease without esophagitis: Secondary | ICD-10-CM

## 2020-09-12 DIAGNOSIS — E119 Type 2 diabetes mellitus without complications: Secondary | ICD-10-CM

## 2020-09-12 DIAGNOSIS — R12 Heartburn: Secondary | ICD-10-CM

## 2020-09-12 DIAGNOSIS — K5000 Crohn's disease of small intestine without complications: Secondary | ICD-10-CM

## 2020-09-12 DIAGNOSIS — E039 Hypothyroidism, unspecified: Secondary | ICD-10-CM

## 2020-09-12 DIAGNOSIS — D751 Secondary polycythemia: Secondary | ICD-10-CM

## 2020-09-12 DIAGNOSIS — K5090 Crohn's disease, unspecified, without complications: Secondary | ICD-10-CM

## 2020-09-12 MED ORDER — DICYCLOMINE 20 MG PO TAB
20 mg | ORAL_TABLET | Freq: Four times a day (QID) | ORAL | 3 refills | Status: AC | PRN
Start: 2020-09-12 — End: ?

## 2020-09-12 MED ORDER — FLUCONAZOLE 100 MG PO TAB
100 mg | ORAL_TABLET | Freq: Every day | ORAL | 0 refills | 3.00000 days | Status: AC
Start: 2020-09-12 — End: ?

## 2020-09-13 ENCOUNTER — Encounter: Admit: 2020-09-13 | Discharge: 2020-09-13 | Payer: Commercial Managed Care - HMO

## 2020-09-16 ENCOUNTER — Encounter: Admit: 2020-09-16 | Discharge: 2020-09-16 | Payer: Commercial Managed Care - HMO

## 2020-09-16 ENCOUNTER — Ambulatory Visit: Admit: 2020-09-16 | Discharge: 2020-09-16 | Payer: Commercial Managed Care - HMO

## 2020-09-16 DIAGNOSIS — K5090 Crohn's disease, unspecified, without complications: Secondary | ICD-10-CM

## 2020-09-16 DIAGNOSIS — R1310 Dysphagia, unspecified: Secondary | ICD-10-CM

## 2020-09-16 DIAGNOSIS — K5000 Crohn's disease of small intestine without complications: Secondary | ICD-10-CM

## 2020-09-16 DIAGNOSIS — E039 Hypothyroidism, unspecified: Secondary | ICD-10-CM

## 2020-09-16 DIAGNOSIS — D751 Secondary polycythemia: Secondary | ICD-10-CM

## 2020-09-16 DIAGNOSIS — E119 Type 2 diabetes mellitus without complications: Secondary | ICD-10-CM

## 2020-09-16 DIAGNOSIS — E785 Hyperlipidemia, unspecified: Secondary | ICD-10-CM

## 2020-09-16 DIAGNOSIS — K219 Gastro-esophageal reflux disease without esophagitis: Secondary | ICD-10-CM

## 2020-09-16 DIAGNOSIS — R12 Heartburn: Secondary | ICD-10-CM

## 2020-09-16 DIAGNOSIS — M797 Fibromyalgia: Secondary | ICD-10-CM

## 2020-11-19 ENCOUNTER — Encounter: Admit: 2020-11-19 | Discharge: 2020-11-19 | Payer: Commercial Managed Care - HMO

## 2020-12-10 ENCOUNTER — Encounter: Admit: 2020-12-10 | Discharge: 2020-12-10 | Payer: Commercial Managed Care - HMO

## 2020-12-10 ENCOUNTER — Ambulatory Visit: Admit: 2020-12-10 | Discharge: 2020-12-11 | Payer: Commercial Managed Care - HMO

## 2020-12-10 DIAGNOSIS — E785 Hyperlipidemia, unspecified: Secondary | ICD-10-CM

## 2020-12-10 DIAGNOSIS — K5090 Crohn's disease, unspecified, without complications: Secondary | ICD-10-CM

## 2020-12-10 DIAGNOSIS — R197 Diarrhea, unspecified: Secondary | ICD-10-CM

## 2020-12-10 DIAGNOSIS — D751 Secondary polycythemia: Secondary | ICD-10-CM

## 2020-12-10 DIAGNOSIS — E039 Hypothyroidism, unspecified: Secondary | ICD-10-CM

## 2020-12-10 DIAGNOSIS — K5000 Crohn's disease of small intestine without complications: Secondary | ICD-10-CM

## 2020-12-10 DIAGNOSIS — K219 Gastro-esophageal reflux disease without esophagitis: Secondary | ICD-10-CM

## 2020-12-10 DIAGNOSIS — M797 Fibromyalgia: Secondary | ICD-10-CM

## 2020-12-10 DIAGNOSIS — E119 Type 2 diabetes mellitus without complications: Secondary | ICD-10-CM

## 2020-12-10 DIAGNOSIS — R1031 Right lower quadrant pain: Secondary | ICD-10-CM

## 2020-12-10 DIAGNOSIS — R12 Heartburn: Secondary | ICD-10-CM

## 2020-12-10 DIAGNOSIS — R1310 Dysphagia, unspecified: Secondary | ICD-10-CM

## 2020-12-10 DIAGNOSIS — Z79899 Other long term (current) drug therapy: Secondary | ICD-10-CM

## 2020-12-10 DIAGNOSIS — B3781 Candidal esophagitis: Secondary | ICD-10-CM

## 2020-12-10 NOTE — Progress Notes
Telehealth Visit Note    Date of Service: 12/10/2020    Subjective:      Obtained patient's verbal consent to treat them and their agreement to [facility] financial policy and NPP via this telehealth visit during the [facility] Emergency       Alan [name] is a 40 y.o. male.patient of Dr. Janene Madeira with history of ileocolonic Crohn's Disease (dx 01/2018), cholecystectomy (05/2019), DM2, fibromyalgia, and polycythemia vera. He also has history of dysphagia requiring esophageal dilation in the past. Last OV via telehealth 09/12/20 with Raina Mina, APRN. His Crohn's was initially treated with Humira and azathioprine. He stopped AZA due to possible inducing pancreatitis and switched from Humira to Cimzia due to antibodies to Humira. Most recent colonoscopy 10/2019 showed TI ulceration, biopsies with inflammation. He was treated with an initial 8 weeks of budesonide and then an additional 8 weeks. 02/2020 he was started on Stelara.  He was scheduled for a repeat colonoscopy to evaluate mucosal healing in May, 2022, which he subsequently canceled because he was busy. He presents today for follow-up via telehealth.    History of Present Illness   He has had his initial Stelara infusion, and 3 injections of Stelara, last injection around Nov 02, 2020, scheduled every 8 weeks.  He does not write down when he is due for his Marcy Panning, he takes it whenever he gets it from the pharmacy, on the same day.  He does not recall when his labs or stool studies were last checked regarding his Crohn's disease.  He does not feel any relief of symptoms since initiation of Stelara.    He is currently having up to 10-12 watery stools per day, he frequently avoids eating so as to lessen the frequency of bowel movements.  He will have initiation of bowel movements sometimes before he completes eating, and sometimes up to 30 minutes after eating.  He will have approximately 4 liquid stools on days where he only eats 1 meal.  2 to 3 months ago he had an episode of blood in stools that lasted 2 days, self resolved.  He has chronic, persistent right lower quadrant abdominal pain.  Dicyclomine does work well for his pain when nothing else has seemed to work. He is unsure if he has ever tried imodium for diarrhea. His bowel pattern will also alternate between diarrhea and constipation every few days without any medication intervention. He rarely has a normal bowel movement.     He complains of persistent dysphagia.  He had an episode of dysphagia over this past weekend where he was eating at a restaurant, and choked on his food, and had to proceed to the bathroom, where he proceeded to vomit and gag to get up his food.  He reports eating noodles, veggies, ribs.  He said he did vomit in his plate.  He has a history of dysphagia, last EGD 11/13/2019, with dilation.  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  He was prescribed this again after his last visit on 09/12/2020, although does not verbalize whether or not he took the medicine. His abdominal pain is 5/10 and has been that way for a few weeks.     Just had a genetic test via pcp to see if he would respond better to a particular psychiatric medication, reports that the test showed he does not absorb meds as quickly  as he should.  He is wondering how this may affect his Crohn's medications.    EGD/Colonoscopy 11/13/19  - Esophagogastric landmarks identified.   ? ? ? ? ? ? ? ? ? ? ? ? ? ? ? - Normal esophagus. Biopsied.   ? ? ? ? ? ? ? ? ? ? ? ? ? ? ? - Mild Schatzki ring. Dilated.   ? ? ? ? ? ? ? ? ? ? ? ? ? ? ? - Normal stomach. Biopsied.   ? ? ? ? ? ? ? ? ? ? ? ? ? ? ? - Normal examined duodenum. Biopsied.   - Multiple ulcers in the distal ileum. Biopsied.   ? ? ? ? ? ? ? ? ? ? ? ? ? ? ? - Diverticulosis in the sigmoid colon.   ? ? ? ? ? ? ? ? ? ? ? ? ? ? ? - Non-bleeding external hemorrhoids.   ? ? ? ? ? ? ? ? ? ? ? ? ? ? ? - The examination was otherwise normal.   ? ? ? ? ? ? ? ? ? ? ? ? ? ? ? - Biopsies were taken with a cold forceps for   ? ? ? ? ? ? ? ? ? ? ? ? ? ? ? histology in the entire colon.   Final Diagnosis:     A. Small bowel mucosa, duodenal biopsy r/o celiac and crohns, biopsy:   Intact villous architecture with no diagnostic abnormalities.     B. Gastric mucosa, gastric biopsy r/o h pylori, biopsy:   Antral and oxyntic mucosa with no diagnostic abnormalities.   No Helicobacter pylori organisms identified on the H and E stained   slide.     C. Squamous mucosa, random esophageal biopsy r/o esophagitis, biopsy:   Esophagitis with increased lymphocytes and active inflammation.   PAS/D stain is positive for focal fungal organisms, suggestive of   Candida esophagitis.   See comment.     D. Small bowel mucosa, terminal ileum biopsy hx of crohns, biopsy:   Chronic mildly active ileitis.   Negative for dysplasia. ? ?     E. Colonic mucosa, random colon biopsy r/o inflammation, biopsy:   Focal mild architectural distortion suggestive of focal quiescent   colitis.   Negative for dysplasia.     Comment:   The esophageal biopsy demonstrates markedly increased intraepithelial   lymphocytes, with focal/rare eosinophils and neutrophils. ?The PAS/D stain   highlights focal fungal organisms. 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 ?Possible   underlying etiologies for lymphocytic esophagitis include Crohn's disease,   motility disorders, GERD, immune-mediated disorders, and medications       Review of Systems   Constitutional: Positive for fatigue.   HENT: Negative.    Eyes: Negative.    Respiratory: Negative.    Cardiovascular: Negative.    Gastrointestinal: Positive for abdominal pain, constipation, diarrhea and vomiting.   Endocrine: Negative.    Genitourinary: Negative.    Musculoskeletal: Negative. Skin: Negative.    Allergic/Immunologic: Negative.    Neurological: Negative.    Hematological: Negative.    Psychiatric/Behavioral: Negative.    All other systems reviewed and are negative.        Objective:         ? BD ULTRA-FINE SHORT PEN NEEDLE 31 gauge x 5/16 pen needle USE TO INJECT INSULIN 4 TIMES A DAY   ? budesonide (ENTOCORT  EC) 3 mg capsule TAKE 3 CAPSULES BY MOUTH EVERY DAY FOR 56 DAYS   ? buPROPion XL (WELLBUTRIN XL) 150 mg tablet Take 150 mg by mouth daily.   ? cetirizine (ZYRTEC) 10 mg tablet Daily   ? clonazePAM (KLONOPIN) 1 mg tablet TAKE 1 TABLET BY MOUTH TWICE A DAY AS NEEDED FOR ANXIETY   ? desvenlafaxine succinate (PRISTIQ) 50 mg tablet    ? dicyclomine (BENTYL) 20 mg tablet Take one tablet by mouth four times daily as needed.   ? diphenhydrAMINE (BENADRYL) 25 mg capsule 1 Cap, QID, PO, PRN, Allergy   ? duloxetine DR (CYMBALTA) 30 mg capsule Take 30 mg by mouth daily.   ? FARXIGA 10 mg tablet Take 10 mg by mouth every morning.   ? HUMALOG KWIKPEN INSULIN 100 unit/mL injection PEN INJECT 20 UNITS SUB Q 3 TIMES DAILY WITH MEALS   ? L-METHYLFOLATE 7.5 mg tab    ? LANTUS SOLOSTAR U-100 INSULIN 100 unit/mL (3 mL) injection PEN INJECT 50 UNITS SUB Q DAILY AT BEDTIME   ? levothyroxine (SYNTHROID) 25 mcg tablet    ? LORazepam (ATIVAN) 1 mg tablet Take 1 mg by mouth as Needed for Nausea, Vomiting or Other....   ? methylPREDNIsolone (MEDROL DOSEPAK) 4 mg tablet 32mg  by mouth 12 hours prior to MRI and 32mg  by mouth 2 hours prior to MRI.  And over the counter Benadryl 50mg  2 hours prior to scan.  MRI requires you to have a driver.   ? pantoprazole DR (PROTONIX) 40 mg tablet Take 40 mg by mouth daily.   ? peg-electrolyte solution (NULYTELY LEMON-LIME) 420 gram oral solution Split dose by mouth as directed by GI office   ? peg3350-sod sul-NaCl-KCl-asb-C (PLENVU) 140-9-5.2 gram ppks Take 1 each by mouth as directed. Split dose   ? rosuvastatin (CRESTOR) 40 mg tablet Take 40 mg by mouth at bedtime daily. ? SAVELLA 50 mg tablet    ? TRESIBA FLEXTOUCH U-200 200 unit/mL (3 mL) injection PEN INJECT 50 UNITS SUBCUTANEOUSLY DAILY   ? ustekinumab (STELARA) 90 mg syringe Inject 1 mL under the skin every 8 weeks. Indications: Crohn's disease   ? VASCEPA 1 gram capsule TAKE 2 CAPSULES BY MOUTH TWICE A DAY          Telehealth Patient Reported Vitals     Row Name 12/10/20 1454                Weight: 95.3 kg (210 lb)  per pt        Height: 175.3 cm (5' 9)        Pain Location: Other  all over                  Telehealth Body Mass Index: 31.01 at 12/10/2020  4:22 PM    Physical Exam  Limited exam due to nature of telemedicine    No acute distress  No focal deficits  Alert and oriented x 3  Normal affect       Assessment and Plan:  Alan [name] is a 40 y.o. male.patient of Dr. Janene Madeira with history of ileocolonic Crohn's Disease (dx 01/2018), cholecystectomy (05/2019), DM2, fibromyalgia, and polycythemia vera. He also has history of dysphagia requiring esophageal dilation in the past. Last OV via telehealth 09/12/20 with Raina Mina, APRN. His Crohn's was initially treated with Humira and azathioprine. He stopped AZA due to possible inducing pancreatitis and switched from Humira to Cimzia due to antibodies to Humira. Most recent colonoscopy 10/2019 showed TI ulceration,  biopsies with inflammation. He was treated with an initial 8 weeks of budesonide and then an additional 8 weeks. 02/2020 he was started on Stelara.  He was scheduled for a repeat colonoscopy to evaluate mucosal healing in May, 2022, which he subsequently canceled because he was busy. He presents today for follow-up via telehealth.    History of Present Illness   He has had his initial Stelara infusion, and 3 injections of Stelara, last injection around Nov 02, 2020, scheduled every 8 weeks.  He does not write down when he is due for his Marcy Panning, he takes it whenever he gets it from the pharmacy, on the same day.  He does not recall when his labs or stool studies were last checked regarding his Crohn's disease.  He does not feel any relief of symptoms since initiation of Stelara.    He is currently having up to 10-12 watery stools per day, he frequently avoids eating so as to lessen the frequency of bowel movements.  He will have initiation of bowel movements sometimes before he completes eating, and sometimes up to 30 minutes after eating.  He will have approximately 4 liquid stools on days where he only eats 1 meal.  2 to 3 months ago he had an episode of blood in stools that lasted 2 days, self resolved.  He has chronic, persistent right lower quadrant abdominal pain.  Dicyclomine does work well for his pain when nothing else has seemed to work. He is unsure if he has ever tried imodium for diarrhea. His bowel pattern will also alternate between diarrhea and constipation every few days without any medication intervention. He rarely has a normal bowel movement.     He complains of persistent dysphagia.  He had an episode of dysphagia over this past weekend where he was eating at a restaurant, and choked on his food, and had to proceed to the bathroom, where he proceeded to vomit and gag to get up his food.  He reports eating noodles, veggies, ribs.  He said he did vomit in his plate.  He has a history of dysphagia, last EGD 11/13/2019, with dilation.  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  He was prescribed this again after his last visit on 09/12/2020, although does not verbalize whether or not he took the medicine. His abdominal pain is 5/10 and has been that way for a few weeks.     Just had a genetic test via pcp to see if he would respond better to a particular psychiatric medication, reports that the test showed he does not absorb meds as quickly as he should.  He is wondering how this may affect his Crohn's medications.      Encounter Diagnoses   Name Primary?   ? Crohn's disease involving terminal ileum (HCC) Yes   ? Dysphagia, unspecified type    ? Diarrhea, unspecified type    ? Right lower quadrant abdominal pain    ? Medication management    ? Candida esophagitis (HCC)       1. Labs, including CBC, CMP, CRP, sed rate, vitamin D level, B12, folate, celiac panel.  2. Stool studies, including C. difficile, stool culture, O&P, fecal calprotectin, pancreatic elastase, fecal fat, H. pylori, Cryptosporidium, Giardia.  3.  Schedule EGD with possible dilation  for dysphagia, history of Candida esophagitis per last EGD 11/13/2019.  4.  Colonoscopy to evaluate for microscopic colitis, CMV, and evaluate TI involvement and mucosal healing of Crohn's disease since initiation of Stelara  5.  Stelara activity and antibody level on day of next dose, prior to next dose.  6.  Consider trigger point injection for chronic right lower quadrant pain, pending labs, stool studies and colonoscopy findings.  7.  Continue pantoprazole 40 mg daily, but instead of taking in the evening, take 30 minutes before a meal.  8. Follow-up as 3 months with Dr. Janene Madeira.    A total of 30 minutes were spent today on this encounter.  This includes preparing for the visit, reviewing medical records, obtaining relevant history, speaking to the patient, interpreting test results, ordering medications and tests and documenting the visit in the electronic health record.

## 2020-12-10 NOTE — Telephone Encounter
MA attempt to room patient virtually and patient mentions did not complete EGD/Colonoscopy for reason not keeping Telehealth appointment today.  Did speak with patient as patient is signed into appointment for today but then signs out, discontinues call.  Message to [employer] to remove patient from schedule today and place on list to be seen by new Fellow Dr. Molinda Bailiff.

## 2020-12-11 ENCOUNTER — Encounter: Admit: 2020-12-11 | Discharge: 2020-12-11 | Payer: Commercial Managed Care - HMO

## 2020-12-12 ENCOUNTER — Encounter: Admit: 2020-12-12 | Discharge: 2020-12-12 | Payer: Commercial Managed Care - HMO

## 2020-12-12 NOTE — Telephone Encounter
Faxed all labs and stool test orders to [phone]. Received successful fax confirmation.

## 2020-12-17 ENCOUNTER — Ambulatory Visit: Admit: 2020-12-17 | Discharge: 2020-12-17 | Payer: Commercial Managed Care - HMO

## 2020-12-17 ENCOUNTER — Encounter: Admit: 2020-12-17 | Discharge: 2020-12-17 | Payer: Commercial Managed Care - HMO

## 2020-12-17 ENCOUNTER — Emergency Department: Admit: 2020-12-17 | Discharge: 2020-12-17 | Payer: Commercial Managed Care - HMO

## 2020-12-17 DIAGNOSIS — K5090 Crohn's disease, unspecified, without complications: Secondary | ICD-10-CM

## 2020-12-17 DIAGNOSIS — R1310 Dysphagia, unspecified: Secondary | ICD-10-CM

## 2020-12-17 DIAGNOSIS — B3781 Candidal esophagitis: Secondary | ICD-10-CM

## 2020-12-17 DIAGNOSIS — R079 Chest pain, unspecified: Secondary | ICD-10-CM

## 2020-12-17 DIAGNOSIS — R42 Dizziness and giddiness: Secondary | ICD-10-CM

## 2020-12-17 DIAGNOSIS — R519 Chronic nonintractable headache, unspecified headache type: Secondary | ICD-10-CM

## 2020-12-17 DIAGNOSIS — K5000 Crohn's disease of small intestine without complications: Secondary | ICD-10-CM

## 2020-12-17 DIAGNOSIS — E538 Deficiency of other specified B group vitamins: Secondary | ICD-10-CM

## 2020-12-17 LAB — URINALYSIS DIPSTICK REFLEX TO CULTURE
LEUKOCYTES: NEGATIVE
NITRITE: NEGATIVE
URINE ASCORBIC ACID, UA: NEGATIVE
URINE BILE: NEGATIVE
URINE BLOOD: NEGATIVE
URINE KETONE: NEGATIVE
URINE PH: 8 (ref 5.0–8.0)
URINE SPEC GRAVITY: 1 /HPF (ref 1.005–1.030)

## 2020-12-17 LAB — COMPREHENSIVE METABOLIC PANEL
ALK PHOSPHATASE: 69 U/L (ref 25–110)
ALT: 9 U/L (ref 7–56)
ANION GAP: 9 K/UL — ABNORMAL HIGH (ref 3–12)
AST: 14 U/L (ref 7–40)
CALCIUM: 9.5 mg/dL — ABNORMAL HIGH (ref 8.5–10.6)
CO2: 24 MMOL/L (ref 21–30)
CREATININE: 0.6 mg/dL (ref 0.4–1.24)
GLUCOSE,PANEL: 136 mg/dL — ABNORMAL HIGH (ref 70–100)
SODIUM: 141 MMOL/L (ref 137–147)
TOTAL PROTEIN: 6.8 g/dL (ref 6.0–8.0)

## 2020-12-17 LAB — VITAMIN B12: VITAMIN B12: 394 pg/mL (ref 180–914)

## 2020-12-17 LAB — TSH WITH FREE T4 REFLEX: TSH: 1.4 uU/mL (ref 0.35–5.00)

## 2020-12-17 LAB — BNP POC ER: BNP POC: 37 pg/mL (ref 0–100)

## 2020-12-17 LAB — PTT (APTT): PTT: 31 s (ref 24.0–36.5)

## 2020-12-17 LAB — URINALYSIS MICROSCOPIC REFLEX TO CULTURE

## 2020-12-17 LAB — CBC AND DIFF
ABSOLUTE BASO COUNT: 0 K/UL (ref 0–0.20)
ABSOLUTE EOS COUNT: 0.5 K/UL — ABNORMAL HIGH (ref 0–0.45)
ABSOLUTE LYMPH COUNT: 3.4 K/UL (ref >60)
MCH: 29 pg (ref 26–34)
MDW (MONOCYTE DISTRIBUTION WIDTH): 19 (ref <20.7)
MPV: 7.3 FL (ref 7–11)
NEUTROPHILS %: 64 % (ref 41–77)
WBC COUNT: 13 K/UL — ABNORMAL HIGH (ref 4.5–11.0)

## 2020-12-17 LAB — MAGNESIUM: MAGNESIUM: 1.9 mg/dL (ref 1.6–2.6)

## 2020-12-17 LAB — POC TROPONIN: TROPONIN I, POC: 0 ng/mL (ref 0.00–0.05)

## 2020-12-17 LAB — PROTIME INR (PT): PROTIME: 10 s (ref 9.5–14.2)

## 2020-12-17 LAB — FOLATE, SERUM: SERUM FOLATE: >22 ng/mL (ref >3.9)

## 2020-12-17 MED ORDER — LACTATED RINGERS IV SOLP
1000 mL | INTRAVENOUS | 0 refills | Status: CP
Start: 2020-12-17 — End: ?
  Administered 2020-12-18: 03:00:00 1000 mL via INTRAVENOUS

## 2020-12-17 MED ORDER — DIPHENHYDRAMINE HCL 50 MG/ML IJ SOLN
25 mg | Freq: Once | INTRAVENOUS | 0 refills | Status: CP
Start: 2020-12-17 — End: ?
  Administered 2020-12-18: 03:00:00 25 mg via INTRAVENOUS

## 2020-12-17 MED ORDER — PROCHLORPERAZINE EDISYLATE 5 MG/ML IJ SOLN
10 mg | Freq: Once | INTRAVENOUS | 0 refills | Status: CP
Start: 2020-12-17 — End: ?
  Administered 2020-12-18: 03:00:00 10 mg via INTRAVENOUS

## 2020-12-17 MED ORDER — KETOROLAC 15 MG/ML IJ SOLN
15 mg | Freq: Once | INTRAVENOUS | 0 refills | Status: CP
Start: 2020-12-17 — End: ?
  Administered 2020-12-18: 05:00:00 15 mg via INTRAVENOUS

## 2020-12-18 ENCOUNTER — Emergency Department: Admit: 2020-12-18 | Discharge: 2020-12-17 | Payer: Commercial Managed Care - HMO

## 2020-12-19 ENCOUNTER — Encounter: Admit: 2020-12-19 | Discharge: 2020-12-19 | Payer: Commercial Managed Care - HMO

## 2020-12-19 DIAGNOSIS — R7989 Other specified abnormal findings of blood chemistry: Secondary | ICD-10-CM

## 2020-12-19 MED ORDER — ERGOCALCIFEROL (VITAMIN D2) 1,250 MCG (50,000 UNIT) PO CAP
1 | ORAL_CAPSULE | ORAL | 0 refills | 30.00000 days | Status: AC
Start: 2020-12-19 — End: ?

## 2020-12-19 NOTE — Telephone Encounter
Outside labs reviewed by Ralph Leyden, APRN. Given to MAs for scanning. Per e-mail:    Vitamin D is low at 21; his CRP was elevated at 1.8 and wbc 14. Labs otherwise unremarkable.     Can you send a prescription for vitamin D 50,000 units once a week times 12 weeks, then recall to repeat vitamin D level.     Called and LDVM (authorization on file) letting the pt know prescription has been sent to pt pharmacy and orders have been placed for repeat lab to be drawn in 3 months.

## 2020-12-20 ENCOUNTER — Encounter: Admit: 2020-12-20 | Discharge: 2020-12-20 | Payer: Commercial Managed Care - HMO

## 2020-12-20 DIAGNOSIS — R1031 Right lower quadrant pain: Secondary | ICD-10-CM

## 2020-12-20 DIAGNOSIS — R197 Diarrhea, unspecified: Secondary | ICD-10-CM

## 2020-12-20 DIAGNOSIS — B3781 Candidal esophagitis: Secondary | ICD-10-CM

## 2020-12-20 DIAGNOSIS — K5000 Crohn's disease of small intestine without complications: Secondary | ICD-10-CM

## 2020-12-20 DIAGNOSIS — R7989 Other specified abnormal findings of blood chemistry: Secondary | ICD-10-CM

## 2020-12-20 LAB — COMPREHENSIVE METABOLIC PANEL
ALBUMIN: 3.7
ALK PHOSPHATASE: 78
ALT: 18
ANION GAP: 12
AST: 17
BLD UREA NITROGEN: 11
CALCIUM: 9.1
CHLORIDE: 109 — ABNORMAL HIGH
CO2: 21 — ABNORMAL LOW
CREATININE: 0.7
GFR ESTIMATED: 116
GLUCOSE,PANEL: 99
POTASSIUM: 3.6
SODIUM: 138
TOTAL BILIRUBIN: 0.7
TOTAL PROTEIN: 6.5

## 2020-12-20 LAB — FOLATE, SERUM: SERUM FOLATE: >24 % (ref >60)

## 2020-12-20 LAB — 25-OH VITAMIN D (D2 + D3): VITAMIN D (25-OH) TOTAL: 21

## 2020-12-20 LAB — CBC: WBC COUNT: 14 — ABNORMAL HIGH

## 2020-12-20 NOTE — Progress Notes
Please notify patient    Your recent lab test indicated your vitamin D level is low.  It is 21, and should be above 30.  We will send prescription for vitamin D 50,000 units once a week, x12 weeks, then repeat vitamin D level.  If vitamin D normal at that time, recommend 5000 units of vitamin D daily, and repeat vitamin D annually.  If repeat vitamin D level still suboptimal, will repeat course as above.    B12, folate normal and celiac test negative.    White blood cell count elevated at 14,000, normal is 6-10,000.  This can indicate a stress response, infection-likely related to her Crohn's and diarrhea.  Will monitor.

## 2020-12-23 ENCOUNTER — Encounter: Admit: 2020-12-23 | Discharge: 2020-12-23 | Payer: Commercial Managed Care - HMO

## 2020-12-23 DIAGNOSIS — K219 Gastro-esophageal reflux disease without esophagitis: Secondary | ICD-10-CM

## 2020-12-23 DIAGNOSIS — E119 Type 2 diabetes mellitus without complications: Secondary | ICD-10-CM

## 2020-12-23 DIAGNOSIS — D751 Secondary polycythemia: Secondary | ICD-10-CM

## 2020-12-23 DIAGNOSIS — E039 Hypothyroidism, unspecified: Secondary | ICD-10-CM

## 2020-12-23 DIAGNOSIS — K5090 Crohn's disease, unspecified, without complications: Secondary | ICD-10-CM

## 2020-12-23 DIAGNOSIS — E785 Hyperlipidemia, unspecified: Secondary | ICD-10-CM

## 2020-12-23 DIAGNOSIS — R12 Heartburn: Secondary | ICD-10-CM

## 2020-12-23 DIAGNOSIS — M797 Fibromyalgia: Secondary | ICD-10-CM

## 2020-12-27 ENCOUNTER — Encounter: Admit: 2020-12-27 | Discharge: 2020-12-27 | Payer: Commercial Managed Care - HMO

## 2020-12-27 NOTE — Patient Instructions
--   If you are having acute (new/sudden onset) or severe/worsening neurologic symptoms, please call 911 or seek care in ED.    -- If you have internet access/smart phone please contact me through MyChart. This is our preferred method of contact and the most efficient way to contact your healthcare team. If you do not have internet access/smart phone you can call at [phone].   -- Preferred method of communication is through [employer], if the issue cannot wait until your next scheduled follow up.   -- MyChart may be used for non-emergent communication. Emails are not reviewed after hours or over the weekend/holidays/after 4PM. Staff will reply to your email within 24-48 business hours.     -- Please do not leave multiple voicemails for [place], leaving multiple voicemails delay [place] in getting back to you quicker.    -- If you do not hear from [place] within one week of a lab or imaging study being completed, please call/send my chart email to the office to be sure that we have received the results. This is especially challenging when tests are done outside of the [facility] system, as many times results do not make it back to our office for a variety of reasons. In our office no news is good news does not apply. You should hear from [place] with results for each test.    -- For referrals placed during the visit, if you have not heard from scheduling within one week, please call the call center at [phone] to get scheduling assistance.  -- For radiology referrals placed during the visit, if you have not heard from scheduling within one week, please call the call center at [phone] to get scheduling assistance.    -- For medication refills, please first contact your pharmacy, who will fax a refill authorization request form to our office.  Weekdays only. Allow up to 2 business days for refills. Please plan ahead.  -- Allow one business week for our office to complete any requested paperwork (FMLA, etc.)     -- Our front desk staff, Samul Dada, or Marcelino Duster may be reached at [phone] for scheduling needs.   Huntley Dec, RN, may be contacted at [phone] for urgent needs. Staff will return your call within 24 business hours.     For Appointments:   -- Please try to arrive early for your appointment time to help facilitate your visit.   -- If you are late to your appointment, we reserve the right to ask you to reschedule or wait until next available time to be seen in fairness to other patients scheduled that day.   -- There are times when we are running behind in clinic. Our goal is to always be on time, however, there are time when unexpected events occur with patients, which may cause a delay. We appreciate your understanding when this occurs.  -- Please stop at the front desk after each visit to make sure we schedule your follow-up visit and print any referrals or labs for you.

## 2020-12-31 ENCOUNTER — Encounter: Admit: 2020-12-31 | Discharge: 2020-12-31 | Payer: Commercial Managed Care - HMO

## 2020-12-31 ENCOUNTER — Ambulatory Visit: Admit: 2020-12-31 | Discharge: 2020-12-31 | Payer: Commercial Managed Care - HMO

## 2020-12-31 DIAGNOSIS — D751 Secondary polycythemia: Secondary | ICD-10-CM

## 2020-12-31 DIAGNOSIS — M797 Fibromyalgia: Secondary | ICD-10-CM

## 2020-12-31 DIAGNOSIS — E039 Hypothyroidism, unspecified: Secondary | ICD-10-CM

## 2020-12-31 DIAGNOSIS — R12 Heartburn: Secondary | ICD-10-CM

## 2020-12-31 DIAGNOSIS — K219 Gastro-esophageal reflux disease without esophagitis: Secondary | ICD-10-CM

## 2020-12-31 DIAGNOSIS — E119 Type 2 diabetes mellitus without complications: Secondary | ICD-10-CM

## 2020-12-31 DIAGNOSIS — K5090 Crohn's disease, unspecified, without complications: Secondary | ICD-10-CM

## 2020-12-31 DIAGNOSIS — E785 Hyperlipidemia, unspecified: Secondary | ICD-10-CM

## 2020-12-31 DIAGNOSIS — K21 Gastroesophageal reflux disease with esophagitis without hemorrhage: Secondary | ICD-10-CM

## 2020-12-31 MED ORDER — GLYCOPYRROLATE 0.2 MG/ML IJ SOLN
INTRAVENOUS | 0 refills | Status: DC
Start: 2020-12-31 — End: 2020-12-31

## 2020-12-31 MED ORDER — PROPOFOL 10 MG/ML IV EMUL 50 ML (INFUSION)(AM)(OR)
INTRAVENOUS | 0 refills | Status: DC
Start: 2020-12-31 — End: 2020-12-31

## 2020-12-31 MED ORDER — PANTOPRAZOLE 40 MG PO TBEC
40 mg | ORAL_TABLET | Freq: Two times a day (BID) | ORAL | 3 refills | 90.00000 days | Status: AC
Start: 2020-12-31 — End: ?

## 2021-01-01 ENCOUNTER — Encounter: Admit: 2021-01-01 | Discharge: 2021-01-01 | Payer: Commercial Managed Care - HMO

## 2021-01-01 DIAGNOSIS — K219 Gastro-esophageal reflux disease without esophagitis: Secondary | ICD-10-CM

## 2021-01-01 DIAGNOSIS — K5090 Crohn's disease, unspecified, without complications: Secondary | ICD-10-CM

## 2021-01-01 DIAGNOSIS — E119 Type 2 diabetes mellitus without complications: Secondary | ICD-10-CM

## 2021-01-01 DIAGNOSIS — E039 Hypothyroidism, unspecified: Secondary | ICD-10-CM

## 2021-01-01 DIAGNOSIS — D751 Secondary polycythemia: Secondary | ICD-10-CM

## 2021-01-01 DIAGNOSIS — M797 Fibromyalgia: Secondary | ICD-10-CM

## 2021-01-01 DIAGNOSIS — E785 Hyperlipidemia, unspecified: Secondary | ICD-10-CM

## 2021-01-01 DIAGNOSIS — R12 Heartburn: Secondary | ICD-10-CM

## 2021-01-02 ENCOUNTER — Encounter: Admit: 2021-01-02 | Discharge: 2021-01-02 | Payer: Commercial Managed Care - HMO

## 2021-01-03 ENCOUNTER — Encounter: Admit: 2021-01-03 | Discharge: 2021-01-03 | Payer: Commercial Managed Care - HMO

## 2021-01-03 DIAGNOSIS — K5000 Crohn's disease of small intestine without complications: Secondary | ICD-10-CM

## 2021-01-03 NOTE — Telephone Encounter
-----   Message from Amadeo Garnet, [place] sent at 01/03/2021 11:54 AM CDT -----  Regarding: CRS referral  Please send referral to CRS to discuss TI resection secondary to refractory Crohn's/ poor responder to meds.     Thanks  French Ana

## 2021-01-07 ENCOUNTER — Encounter: Admit: 2021-01-07 | Discharge: 2021-01-07 | Payer: Commercial Managed Care - HMO

## 2021-01-07 NOTE — Telephone Encounter
New Patient Appointment with Colorectal Surgery:   Date: Tues (02/11/21)  Time: 9AM  Provider Name: Dr. Eulah Pont  Other Appt/Tests Coordinated: n/a    Language Interpreter: n/a    Need Hoyer Lift: n/a    Patient expressed understanding of below:   -Check-in 30 min early:  yes  -Wait List: Pt declined.   -No-Show/Late Cancellation Policy: yes  The surgeons ask that we let all patients know of the NS/LC Policy here at [facility] for all appointments: If you need to cancel/reschedule, please try and let us know as far in advance as possible. If you cancel after 12PM the day before the appointment, it is considered a NS/LC. When you accumulate NS/LC, it gives the provider the option to review and possibly decline future appointments.   -Insurance Referral Authorization/Self-Pay: n/a    Provider Preference: Any    Clinic Location: [facility], [address] [place], [address], [place], [place]  [address]    Verbal directions to appointment given on: 7.12.22       Patient has MyChart Access: yes       Referring Provider: Ralph Leyden, APRN (GI)    Care Team Updated on: 7.12.22    Diagnosis:  discuss TI resection secondary to refractory Crohn's/ poor responder to meds.         Verbal records history from: patient     Continuation of Care    GI Lab Testing/Procedure Reports with any Associated Pathology (FROM LAST 10 YEARS):   Colonoscopy, EGD, Flexible Sigmoidoscopy, Anorectal Manometry, Capsule Endoscopy, Endoscopic Ultrasound   Patient GI Testing History:     2022 - EGD and colonoscopy at [facility]. 6th colonoscopy and 4th EGD in four years. All others done by Dr. Jolene Provost (GI) in [place], [place] - Pt thinks he closed his office.      Capsule study at [facility], [place]    Operative Report, Surgical Pathology & Discharge Summary (FROM LAST 10 YEARS):   Abdominal & Pelvic Surgeries, Colon, Rectal & Anal Surgeries (Please also note lifetime history.)  Patient Abdominal/Pelvic Surgery History:     Dec, 2021 - gallbladder removal at [facility], [place], [place]    Radiology Imaging Reports & Cloud Images (FROM LAST 5 YEARS):   CT c/a/p, MRI a/p, MRE, Xray: Abdomen (KUB), Pelvis, Acute Abdominal Series, Small Bowel Follow Thru, PET Scan: Full Body or Skull to Thighs. Colon Single Contrast &/or MRI Defecography, Barium/Gastrographin Enemas, Sitz Marker Study  Patient Radiology History:     [facility], [place], [place]  [facility], [place], [place]    Pelvic Floor Physical Therapy Progress Notes or Biofeedback Testing/Pelvic EMG Testing (FROM LAST 5 YEARS).   Patient Physical Therapy History:  n/a    Gastroenterology Office Visit Notes/Progress Notes (FROM LAST 5 YEARS).  Patient Office Visit Notes/Progress Notes History: CNC/RN to review and request as needed.     Emailed Records/Referral from Physician Consult to [email or web address] (scan into O2) and corresponding CNC/RN: n/a

## 2021-01-08 ENCOUNTER — Encounter: Admit: 2021-01-08 | Discharge: 2021-01-08 | Payer: Commercial Managed Care - HMO

## 2021-01-08 ENCOUNTER — Ambulatory Visit: Admit: 2021-01-08 | Discharge: 2021-01-09 | Payer: Commercial Managed Care - HMO

## 2021-01-08 DIAGNOSIS — R519 Chronic nonintractable headache, unspecified headache type: Secondary | ICD-10-CM

## 2021-01-08 DIAGNOSIS — K219 Gastro-esophageal reflux disease without esophagitis: Secondary | ICD-10-CM

## 2021-01-08 DIAGNOSIS — R12 Heartburn: Secondary | ICD-10-CM

## 2021-01-08 DIAGNOSIS — M797 Fibromyalgia: Secondary | ICD-10-CM

## 2021-01-08 DIAGNOSIS — K5090 Crohn's disease, unspecified, without complications: Secondary | ICD-10-CM

## 2021-01-08 DIAGNOSIS — R42 Dizziness and giddiness: Secondary | ICD-10-CM

## 2021-01-08 DIAGNOSIS — E119 Type 2 diabetes mellitus without complications: Secondary | ICD-10-CM

## 2021-01-08 DIAGNOSIS — E785 Hyperlipidemia, unspecified: Secondary | ICD-10-CM

## 2021-01-08 DIAGNOSIS — D751 Secondary polycythemia: Secondary | ICD-10-CM

## 2021-01-08 DIAGNOSIS — E039 Hypothyroidism, unspecified: Secondary | ICD-10-CM

## 2021-01-08 NOTE — Progress Notes
Obtained patient's verbal consent to treat them and their agreement to [facility] financial policy and NPP via this telehealth visit during the [facility] Emergency    The following visit was completed via Zoom (Audio/Video).    Alan [name] is a 40 y.o. male.    CC: Dizziness       History of Present Illness    He presents for evaluation of dizziness. Originally started about 4 years ago and has progressively worsened. Headaches started this past winter.     Dizziness may feel like he will black out. This may have a variety of characters including spinning, movement, lightheadedness, or a sense he may pass out. Symptoms are present at rest and worse with movement. Turning head will provoke symptoms as will position changes such as sitting to standing. He was previously seen by cardiology at [facility]. Had cardiac monitor at [facility]. Had tilt table testing at [facility], he reports there was no tilting and that he did not pass out. Did have orthostatic VS which were apparently abnormal. Movement when driving is not particularly bothersome.     Has a holocephalic headache, started Nov-Dec 2021, prior to that time did not experience headaches. No light or sound sensitivity. May have nausea at times. Worse over time. May notice blurry vision with worse headaches. Symptoms may be so severe he won't be able to move. Occasionally may notice numbness in the hands or feet.     Sleep is variable.    No FH of headaches or similar symptoms.          Medical History:   Diagnosis Date   ? Acid reflux    ? Crohn's disease (HCC)    ? Fibromyalgia    ? Heartburn    ? Hyperlipidemia    ? Hypothyroid    ? Polycythemia, secondary    ? Type II diabetes mellitus [facility])      Surgical History:   Procedure Laterality Date   ? HX CHOLECYSTECTOMY  05/2019   ? Colonoscopy N/A 11/13/2019    Performed by Buckles, Vinnie Level, MD at [facility] OR   ? ESOPHAGOGASTRODUODENOSCOPY WITH BIOPSY - FLEXIBLE N/A 11/13/2019    Performed by Buckles, Vinnie Level, MD at [facility] OR   ? Colonoscopy N/A 12/31/2020    Performed by Buckles, Vinnie Level, MD at [facility] OR   ? ESOPHAGOGASTRODUODENOSCOPY WITH BIOPSY - FLEXIBLE N/A 12/31/2020    Performed by Buckles, Vinnie Level, MD at [facility] OR   ? COLONOSCOPY     ? HX SHOULDER SURGERY       Social History     Socioeconomic History   ? Marital status: Married   Tobacco Use   ? Smoking status: Never Smoker   ? Smokeless tobacco: Never Used   Substance and Sexual Activity   ? Alcohol use: Never   ? Drug use: Never     Family History   Problem Relation Age of Onset   ? Cancer Father         all males on father side passed of different form of CA       Review of Systems      Objective:         ? BD ULTRA-FINE SHORT PEN NEEDLE 31 gauge x 5/16 pen needle USE TO INJECT INSULIN 4 TIMES A DAY   ? cetirizine (ZYRTEC) 10 mg tablet Daily   ? clonazePAM (KLONOPIN) 1 mg tablet TAKE 1 TABLET BY MOUTH TWICE A DAY  AS NEEDED FOR ANXIETY   ? cyanocobalamin (VITAMIN B-12) 100 mcg tablet Take 100 mcg by mouth daily.   ? desvenlafaxine succinate (PRISTIQ) 50 mg tablet    ? dicyclomine (BENTYL) 20 mg tablet Take one tablet by mouth four times daily as needed.   ? diphenhydrAMINE (BENADRYL) 25 mg capsule 1 Cap, QID, PO, PRN, Allergy   ? ergocalciferol (vitamin D2) (VITAMIN D2) 1,250 mcg (50,000 unit) capsule Take one capsule by mouth every 7 days for 12 doses.   ? FARXIGA 10 mg tablet Take 10 mg by mouth every morning.   ? HUMALOG KWIKPEN INSULIN 100 unit/mL injection PEN INJECT 20 UNITS SUB Q 3 TIMES DAILY WITH MEALS   ? L-METHYLFOLATE 7.5 mg tab    ? LANTUS SOLOSTAR U-100 INSULIN 100 unit/mL (3 mL) injection PEN INJECT 50 UNITS SUB Q DAILY AT BEDTIME   ? levothyroxine (SYNTHROID) 25 mcg tablet    ? pantoprazole DR (PROTONIX) 40 mg tablet Take one tablet by mouth twice daily.   ? peg-electrolyte solution (NULYTELY LEMON-LIME) 420 gram oral solution Split dose by mouth as directed by GI office   ? peg3350-sod sul-NaCl-KCl-asb-C (PLENVU) 140-9-5.2 gram ppks Take 1 each by mouth as directed. Split dose   ? rosuvastatin (CRESTOR) 40 mg tablet Take 40 mg by mouth at bedtime daily.   ? SAVELLA 50 mg tablet    ? TRESIBA FLEXTOUCH U-200 200 unit/mL (3 mL) injection PEN INJECT 50 UNITS SUBCUTANEOUSLY DAILY   ? ustekinumab (STELARA) 90 mg syringe Inject 1 mL under the skin every 8 weeks. Indications: Crohn's disease   ? VASCEPA 1 gram capsule TAKE 2 CAPSULES BY MOUTH TWICE A DAY     There were no vitals filed for this visit.  There is no height or weight on file to calculate BMI.     Physical Exam    Alert and in no distress.  Converses and answers questions appropriately.  Speech is normal without dysarthria.  Face is symmetric with normal movements.         Assessment and Plan:    Impression:  1. Dizziness, has mixed features which are difficult to precisely define. Has been present for 4 years and is worsening. Has features of both lightheadedness as well as spinning and a sense of movement suggestive of vertigo. Has prior cardiology evaluation and testing at both [facility] and [facility] including loop recorder and tilt table testing which were reportedly unrevealing. Ct head was reviewed and normal.  2. Headaches, most consistent with chronic tension type based on character. Started Nov-Dec 2021. No clear migrainous features. No prior history of frequent or severe headaches.  3. Numbness in the feet. Will need to evaluate this in person to determine next steps.    Plan:  1. Obtain MRI head w/o contrast with attention to IAC.  2. Refer to audiology for vestibular testing.  3. Follow up once these are completed, in person ideal, to discuss next steps.    Total Time Today was 45 minutes in the following activities: Preparing to see the patient, Obtaining and/or reviewing separately obtained history, Performing a medically appropriate examination and/or evaluation, Counseling and educating the patient/family/caregiver, Ordering medications, tests, or procedures and Documenting clinical information in the electronic or other health record

## 2021-01-13 ENCOUNTER — Encounter: Admit: 2021-01-13 | Discharge: 2021-01-13 | Payer: Commercial Managed Care - HMO

## 2021-01-28 ENCOUNTER — Encounter: Admit: 2021-01-28 | Discharge: 2021-01-28 | Payer: Commercial Managed Care - HMO

## 2021-02-12 ENCOUNTER — Encounter: Admit: 2021-02-12 | Discharge: 2021-02-12 | Payer: Commercial Managed Care - HMO

## 2021-02-12 DIAGNOSIS — R519 Chronic nonintractable headache, unspecified headache type: Secondary | ICD-10-CM

## 2021-02-12 DIAGNOSIS — R42 Dizziness and giddiness: Secondary | ICD-10-CM

## 2021-02-25 ENCOUNTER — Encounter: Admit: 2021-02-25 | Discharge: 2021-02-25 | Payer: Commercial Managed Care - HMO

## 2021-02-25 MED ORDER — DICYCLOMINE 20 MG PO TAB
20 mg | ORAL_TABLET | Freq: Four times a day (QID) | ORAL | 3 refills | Status: AC | PRN
Start: 2021-02-25 — End: ?

## 2021-02-25 NOTE — Telephone Encounter
Refill request received for dicyclomine  Last OV 12/10/20  Last fill 09/12/20 120 tabs w/3 refills    Routing to Vela Prose, APRN for approval/refusal

## 2021-03-10 ENCOUNTER — Encounter: Admit: 2021-03-10 | Discharge: 2021-03-10 | Payer: Commercial Managed Care - HMO

## 2021-03-10 NOTE — Progress Notes
ntractable dizziness and paraesthesia starting 1730 on 9/11    Hx: Chroans    Vitals  60hr 11rr, 125/86, 99ra, 36.9    Labs  Wbc 12  Plt 223  Urine normal   Cmp normal  Drug screen neg    Meds  1LNS    Imaging  CT head negative  Chest xray negative.     Reason for transfer: Pt request.

## 2021-03-11 ENCOUNTER — Encounter: Admit: 2021-03-11 | Discharge: 2021-03-11 | Payer: Commercial Managed Care - HMO

## 2021-03-14 ENCOUNTER — Encounter: Admit: 2021-03-14 | Discharge: 2021-03-14 | Payer: Commercial Managed Care - HMO

## 2021-03-14 ENCOUNTER — Ambulatory Visit: Admit: 2021-03-14 | Discharge: 2021-03-15 | Payer: Commercial Managed Care - HMO

## 2021-03-14 DIAGNOSIS — K219 Gastro-esophageal reflux disease without esophagitis: Secondary | ICD-10-CM

## 2021-03-14 DIAGNOSIS — D751 Secondary polycythemia: Secondary | ICD-10-CM

## 2021-03-14 DIAGNOSIS — R12 Heartburn: Secondary | ICD-10-CM

## 2021-03-14 DIAGNOSIS — K21 Gastroesophageal reflux disease with esophagitis without hemorrhage: Secondary | ICD-10-CM

## 2021-03-14 DIAGNOSIS — E785 Hyperlipidemia, unspecified: Secondary | ICD-10-CM

## 2021-03-14 DIAGNOSIS — K5000 Crohn's disease of small intestine without complications: Secondary | ICD-10-CM

## 2021-03-14 DIAGNOSIS — E119 Type 2 diabetes mellitus without complications: Secondary | ICD-10-CM

## 2021-03-14 DIAGNOSIS — R7989 Other specified abnormal findings of blood chemistry: Secondary | ICD-10-CM

## 2021-03-14 DIAGNOSIS — E039 Hypothyroidism, unspecified: Secondary | ICD-10-CM

## 2021-03-14 DIAGNOSIS — M797 Fibromyalgia: Secondary | ICD-10-CM

## 2021-03-14 DIAGNOSIS — K5090 Crohn's disease, unspecified, without complications: Secondary | ICD-10-CM

## 2021-03-18 ENCOUNTER — Encounter: Admit: 2021-03-18 | Discharge: 2021-03-18 | Payer: Commercial Managed Care - HMO

## 2021-03-18 DIAGNOSIS — K5000 Crohn's disease of small intestine without complications: Secondary | ICD-10-CM

## 2021-03-18 MED ORDER — DIPHENHYDRAMINE HCL 25 MG PO TAB
50 mg | ORAL_TABLET | Freq: Once | ORAL | 0 refills | 6.00000 days | Status: AC
Start: 2021-03-18 — End: ?

## 2021-03-18 MED ORDER — METHYLPREDNISOLONE 32 MG PO TAB
32 mg | ORAL_TABLET | ORAL | 0 refills | Status: AC
Start: 2021-03-18 — End: ?

## 2021-03-21 ENCOUNTER — Encounter: Admit: 2021-03-21 | Discharge: 2021-03-21 | Payer: Commercial Managed Care - HMO

## 2021-03-24 ENCOUNTER — Encounter: Admit: 2021-03-24 | Discharge: 2021-03-24 | Payer: Commercial Managed Care - HMO

## 2021-03-24 ENCOUNTER — Ambulatory Visit: Admit: 2021-03-24 | Discharge: 2021-03-24 | Payer: Commercial Managed Care - HMO

## 2021-03-24 DIAGNOSIS — R42 Dizziness and giddiness: Secondary | ICD-10-CM

## 2021-03-26 ENCOUNTER — Encounter: Admit: 2021-03-26 | Discharge: 2021-03-26 | Payer: Commercial Managed Care - HMO

## 2021-04-08 ENCOUNTER — Encounter: Admit: 2021-04-08 | Discharge: 2021-04-08 | Payer: Commercial Managed Care - HMO

## 2021-04-08 DIAGNOSIS — R7989 Other specified abnormal findings of blood chemistry: Secondary | ICD-10-CM

## 2021-04-08 DIAGNOSIS — K5000 Crohn's disease of small intestine without complications: Secondary | ICD-10-CM

## 2021-04-08 DIAGNOSIS — K21 Gastroesophageal reflux disease with esophagitis without hemorrhage: Secondary | ICD-10-CM

## 2021-04-08 LAB — C REACTIVE PROTEIN (CRP): C-REACTIVE PROTEIN: 0.9

## 2021-04-10 ENCOUNTER — Encounter: Admit: 2021-04-10 | Discharge: 2021-04-10 | Payer: Commercial Managed Care - HMO

## 2021-04-14 ENCOUNTER — Encounter: Admit: 2021-04-14 | Discharge: 2021-04-14 | Payer: Commercial Managed Care - HMO

## 2021-04-28 ENCOUNTER — Encounter: Admit: 2021-04-28 | Discharge: 2021-04-28 | Payer: Commercial Managed Care - HMO

## 2021-04-28 ENCOUNTER — Ambulatory Visit: Admit: 2021-04-28 | Discharge: 2021-04-28 | Payer: Commercial Managed Care - HMO

## 2021-04-28 DIAGNOSIS — K5000 Crohn's disease of small intestine without complications: Secondary | ICD-10-CM

## 2021-04-28 LAB — POC GLUCOSE
POC GLUCOSE: 209 mg/dL — ABNORMAL HIGH (ref 70–100)
POC GLUCOSE: 263 mg/dL — ABNORMAL HIGH (ref 70–100)

## 2021-04-28 MED ORDER — BREEZA FOR NEUTRAL ABDOMINAL/PELVIC IMAGING PO SOLN
1500 mL | Freq: Once | ORAL | 0 refills | Status: CP
Start: 2021-04-28 — End: ?

## 2021-04-28 MED ORDER — GLUCAGON HCL 1 MG/ML IJ SOLR
.4 [IU] | Freq: Once | SUBCUTANEOUS | 0 refills | Status: CP
Start: 2021-04-28 — End: ?
  Administered 2021-04-28: 22:00:00 0.4 mg via SUBCUTANEOUS

## 2021-04-28 MED ORDER — GLUCAGON HCL 1 MG/ML IJ SOLR
.6 [IU] | Freq: Once | INTRAVENOUS | 0 refills | Status: CP
Start: 2021-04-28 — End: ?
  Administered 2021-04-28: 22:00:00 0.6 mg via INTRAVENOUS

## 2021-04-28 MED ORDER — GADOBUTROL 10 MMOL/10 ML (1 MMOL/ML) IV SOLN
10 mL | Freq: Once | INTRAVENOUS | 0 refills | Status: CP
Start: 2021-04-28 — End: ?
  Administered 2021-04-28: 22:00:00 10 mL via INTRAVENOUS

## 2021-04-28 MED ORDER — SODIUM CHLORIDE 0.9 % IJ SOLN
25 mL | Freq: Once | INTRAVENOUS | 0 refills | Status: CP
Start: 2021-04-28 — End: ?
  Administered 2021-04-28: 22:00:00 25 mL via INTRAVENOUS

## 2021-04-28 MED ORDER — GADOBENATE DIMEGLUMINE 529 MG/ML (0.1MMOL/0.2ML) IV SOLN
18 mL | Freq: Once | INTRAVENOUS | 0 refills | Status: AC
Start: 2021-04-28 — End: ?

## 2021-04-28 NOTE — Progress Notes
Scan: MRI Enterography Protocol    Order verified: Yes  ?   Allergies reviewed: Yes  ?   Patient history reviewed: denies pheochromocytoma, insulinoma, hypersensitivity to glucagon, or hypersensitivity to lactose (or any other components of the product).    Diabetes: Yes    Pre procedure Blood Sugar: 209     Hand off to [employer]

## 2021-05-06 ENCOUNTER — Encounter: Admit: 2021-05-06 | Discharge: 2021-05-06 | Payer: Commercial Managed Care - HMO

## 2021-05-08 ENCOUNTER — Encounter: Admit: 2021-05-08 | Discharge: 2021-05-08 | Payer: Commercial Managed Care - HMO

## 2021-05-13 ENCOUNTER — Encounter: Admit: 2021-05-13 | Discharge: 2021-05-13 | Payer: Commercial Managed Care - HMO

## 2021-05-15 ENCOUNTER — Encounter: Admit: 2021-05-15 | Discharge: 2021-05-15 | Payer: Commercial Managed Care - HMO

## 2021-05-15 MED ORDER — USTEKINUMAB 90 MG/ML SC SYRG
90 mg | SUBCUTANEOUS | 6 refills | 38.50000 days | Status: AC
Start: 2021-05-15 — End: ?

## 2021-05-15 NOTE — Telephone Encounter
Request for [facility] 03/25/2021 blood work re: Field seismologist level.

## 2021-05-15 NOTE — Telephone Encounter
Review of chart during refill request for Stelara.  Patient has not completed Stelara level as ordered by Ralph Leyden, NP during recent appointment.  Review with GI PharmD and appears patient is overdue for Stelara injection.  Attempt to contact patient and only able to leave message re: Tracy's plan for Stelara and then 8 weeks of compliance, obtain drug level and then take next injection.  Routing refill to Dr. Janene Madeira for approval.

## 2021-05-19 ENCOUNTER — Encounter: Admit: 2021-05-19 | Discharge: 2021-05-19 | Payer: Commercial Managed Care - HMO

## 2021-05-19 DIAGNOSIS — K21 Gastroesophageal reflux disease with esophagitis without hemorrhage: Secondary | ICD-10-CM

## 2021-05-19 DIAGNOSIS — R7989 Other specified abnormal findings of blood chemistry: Secondary | ICD-10-CM

## 2021-05-19 DIAGNOSIS — K5000 Crohn's disease of small intestine without complications: Secondary | ICD-10-CM

## 2021-05-24 ENCOUNTER — Encounter: Admit: 2021-05-24 | Discharge: 2021-05-24 | Payer: Commercial Managed Care - HMO

## 2021-06-03 ENCOUNTER — Encounter: Admit: 2021-06-03 | Discharge: 2021-06-03 | Payer: Commercial Managed Care - HMO

## 2021-06-03 LAB — CBC AND DIFF
ABSOLUTE BASO COUNT: 0 K/UL (ref 0–0.20)
ABSOLUTE EOS COUNT: 0.4 K/UL — ABNORMAL HIGH (ref 0–0.45)
ABSOLUTE LYMPH COUNT: 2.9 K/UL (ref 1.0–4.8)
ABSOLUTE MONO COUNT: 1.1 K/UL — ABNORMAL HIGH (ref 0–0.80)
ABSOLUTE NEUTROPHIL: 7.5 K/UL — ABNORMAL HIGH (ref 1.8–7.0)
BASOPHILS %: 1 % (ref 0–2)
EOSINOPHILS %: 4 % (ref 0–5)
HEMATOCRIT: 47 % (ref 40–50)
HEMOGLOBIN: 16 g/dL (ref 13.5–16.5)
LYMPHOCYTES %: 24 % (ref 24–44)
MCH: 30 pg (ref 26–34)
MCHC: 34 g/dL (ref 32.0–36.0)
MCV: 88 FL (ref 80–100)
MDW (MONOCYTE DISTRIBUTION WIDTH): 24 — ABNORMAL HIGH (ref <20.7)
MONOCYTES %: 9 % (ref 4–12)
MPV: 7.1 FL (ref 7–11)
NEUTROPHILS %: 62 % (ref 41–77)
PLATELET COUNT: 250 K/UL (ref 150–400)
RBC COUNT: 5.4 M/UL (ref 4.4–5.5)
RDW: 14 % (ref 11–15)
WBC COUNT: 12 K/UL — ABNORMAL HIGH (ref 4.5–11.0)

## 2021-06-04 ENCOUNTER — Encounter: Admit: 2021-06-04 | Discharge: 2021-06-04 | Payer: Commercial Managed Care - HMO

## 2021-06-04 ENCOUNTER — Emergency Department: Admit: 2021-06-04 | Discharge: 2021-06-03 | Payer: Commercial Managed Care - HMO

## 2021-06-04 DIAGNOSIS — E119 Type 2 diabetes mellitus without complications: Secondary | ICD-10-CM

## 2021-06-04 DIAGNOSIS — D751 Secondary polycythemia: Secondary | ICD-10-CM

## 2021-06-04 DIAGNOSIS — E785 Hyperlipidemia, unspecified: Secondary | ICD-10-CM

## 2021-06-04 DIAGNOSIS — R12 Heartburn: Secondary | ICD-10-CM

## 2021-06-04 DIAGNOSIS — F99 Mental disorder, not otherwise specified: Secondary | ICD-10-CM

## 2021-06-04 DIAGNOSIS — K5090 Crohn's disease, unspecified, without complications: Secondary | ICD-10-CM

## 2021-06-04 DIAGNOSIS — K219 Gastro-esophageal reflux disease without esophagitis: Secondary | ICD-10-CM

## 2021-06-04 DIAGNOSIS — E039 Hypothyroidism, unspecified: Secondary | ICD-10-CM

## 2021-06-04 DIAGNOSIS — M797 Fibromyalgia: Secondary | ICD-10-CM

## 2021-06-04 MED ADMIN — LORAZEPAM 1 MG PO TAB [4573]: 1 mg | ORAL | @ 10:00:00 | Stop: 2021-06-04 | NDC 69315090501

## 2021-06-04 MED ADMIN — ROSUVASTATIN 10 MG PO TAB [88503]: 40 mg | ORAL | @ 23:00:00 | NDC 50268070911

## 2021-06-04 MED ADMIN — OMEGA 3-DHA-EPA-FISH OIL 300-1,000 MG PO CPDR [306443]: 1000 mg | ORAL | @ 23:00:00 | NDC 07610017120

## 2021-06-04 MED ADMIN — INSULIN GLARGINE 100 UNIT/ML (3 ML) SC INJ PEN [163596]: 50 [IU] | SUBCUTANEOUS | @ 07:00:00 | NDC 00088221905

## 2021-06-04 MED ADMIN — LEVOTHYROXINE 25 MCG PO TAB [4420]: 25 ug | ORAL | @ 23:00:00 | NDC 00904694961

## 2021-06-04 MED ADMIN — SUMATRIPTAN SUCCINATE 50 MG PO TAB [78652]: 50 mg | ORAL | @ 23:00:00 | NDC 65862014736

## 2021-06-04 MED ADMIN — VENLAFAXINE 75 MG PO CP24 [77315]: 75 mg | ORAL | @ 19:00:00 | NDC 50268081811

## 2021-06-04 MED ADMIN — CLONAZEPAM 1 MG PO TAB [9638]: 1 mg | ORAL | @ 09:00:00 | Stop: 2021-06-04 | NDC 60687055511

## 2021-06-04 MED ADMIN — CETIRIZINE 10 MG PO TAB [77730]: 10 mg | ORAL | @ 19:00:00 | Stop: 2021-06-04 | NDC 00904671761

## 2021-06-04 MED ADMIN — DAPAGLIFLOZIN 5 MG PO TAB [320171]: 10 mg | ORAL | @ 23:00:00 | NDC 00310620530

## 2021-06-04 MED ADMIN — INSULIN ASPART 100 UNIT/ML SC FLEXPEN [87504]: 2 [IU] | SUBCUTANEOUS | @ 19:00:00 | NDC 00169633910

## 2021-06-04 MED ADMIN — POTASSIUM CHLORIDE 20 MEQ PO TBTQ [35943]: 60 meq | ORAL | @ 07:00:00 | Stop: 2021-06-04 | NDC 00832532511

## 2021-06-04 NOTE — ED Notes
40 y.o. male presents to ED with cc of anxiety. Pt reports having increased anxiety and 4-5 panic attacks a day that are making it more difficult to do his daily activities. Pt denies SI/HI but states his depression is getting worse. Pt currently sees a psychiatrist and has been trying different medications. PT denies any past suicide attempts and has never thought of a plan. Pt endorses some newly diagnosed medical problems which are preventing him from being as active as he used to which is why he feels his depression is worsening. Pt has no medical complaints today and is physically feeling well. Pt AxO x4, breathing even and unlabored on RA, skin race appropriate, pt on monitors, bed in lowest locked position, call light within reach.     Medical History:   Diagnosis Date    Acid reflux     Crohn's disease (HCC)     Fibromyalgia     Heartburn     Hyperlipidemia     Hypothyroid     Polycythemia, secondary     Type II diabetes mellitus (HCC)      All belongings remain with pt.

## 2021-06-05 MED ADMIN — HYDROXYZINE HCL 25 MG PO TAB [3774]: 25 mg | ORAL | @ 03:00:00 | NDC 00904661761

## 2021-06-05 MED ADMIN — OMEGA 3-DHA-EPA-FISH OIL 300-1,000 MG PO CPDR [306443]: 1000 mg | ORAL | @ 23:00:00 | NDC 07610017120

## 2021-06-05 MED ADMIN — LEVOTHYROXINE 25 MCG PO TAB [4420]: 25 ug | ORAL | @ 12:00:00 | NDC 00904694961

## 2021-06-05 MED ADMIN — VENLAFAXINE 75 MG PO CP24 [77315]: 75 mg | ORAL | @ 14:00:00 | NDC 50268081811

## 2021-06-05 MED ADMIN — OMEGA 3-DHA-EPA-FISH OIL 300-1,000 MG PO CPDR [306443]: 1000 mg | ORAL | @ 14:00:00 | NDC 07610017120

## 2021-06-05 MED ADMIN — ROSUVASTATIN 10 MG PO TAB [88503]: 40 mg | ORAL | @ 14:00:00 | NDC 50268070911

## 2021-06-05 MED ADMIN — TRAZODONE 50 MG PO TAB [8085]: 50 mg | ORAL | @ 03:00:00 | NDC 00904686861

## 2021-06-05 MED ADMIN — NORTRIPTYLINE 25 MG PO CAP [5675]: 25 mg | ORAL | @ 03:00:00 | NDC 50268060411

## 2021-06-05 MED ADMIN — DAPAGLIFLOZIN 5 MG PO TAB [320171]: 10 mg | ORAL | @ 14:00:00 | NDC 00310620530

## 2021-06-05 MED ADMIN — DICYCLOMINE 10 MG PO CAP [2418]: 20 mg | ORAL | @ 14:00:00 | NDC 60687036911

## 2021-06-06 ENCOUNTER — Encounter: Admit: 2021-06-06 | Discharge: 2021-06-06 | Payer: Commercial Managed Care - HMO

## 2021-06-06 MED ADMIN — NORTRIPTYLINE 25 MG PO CAP [5675]: 50 mg | ORAL | @ 03:00:00 | NDC 50268060411

## 2021-06-06 MED ADMIN — TRAZODONE 50 MG PO TAB [8085]: 50 mg | ORAL | @ 06:00:00 | Stop: 2021-06-06 | NDC 00904686861

## 2021-06-06 MED ADMIN — VENLAFAXINE 75 MG PO CP24 [77315]: 75 mg | ORAL | @ 14:00:00 | Stop: 2021-06-06 | NDC 50268081811

## 2021-06-06 MED ADMIN — DICYCLOMINE 10 MG PO CAP [2418]: 20 mg | ORAL | @ 03:00:00 | NDC 60687036911

## 2021-06-06 MED ADMIN — TRAZODONE 50 MG PO TAB [8085]: 50 mg | ORAL | @ 03:00:00 | NDC 00904686861

## 2021-06-06 MED ADMIN — ROSUVASTATIN 10 MG PO TAB [88503]: 40 mg | ORAL | @ 14:00:00 | Stop: 2021-06-06 | NDC 50268070911

## 2021-06-06 MED ADMIN — HYDROXYZINE HCL 25 MG PO TAB [3774]: 25 mg | ORAL | @ 03:00:00 | NDC 00904661761

## 2021-06-06 MED ADMIN — DAPAGLIFLOZIN 5 MG PO TAB [320171]: 10 mg | ORAL | @ 14:00:00 | Stop: 2021-06-06 | NDC 00310620530

## 2021-06-06 MED ADMIN — OMEGA 3-DHA-EPA-FISH OIL 300-1,000 MG PO CPDR [306443]: 1000 mg | ORAL | @ 14:00:00 | Stop: 2021-06-06 | NDC 07610017120

## 2021-06-06 MED ADMIN — LEVOTHYROXINE 25 MCG PO TAB [4420]: 25 ug | ORAL | @ 13:00:00 | Stop: 2021-06-06 | NDC 00904694961

## 2021-06-06 MED FILL — HYDROXYZINE HCL 25 MG PO TAB: 25 mg | ORAL | 8 days supply | Qty: 30 | Fill #1 | Status: CP

## 2021-06-06 MED FILL — NORTRIPTYLINE 50 MG PO CAP: 50 mg | ORAL | 30 days supply | Qty: 30 | Fill #1 | Status: CP

## 2021-06-06 MED FILL — VENLAFAXINE 75 MG PO CP24: 75 mg | ORAL | 30 days supply | Qty: 30 | Fill #1 | Status: CP

## 2021-06-06 MED FILL — TRAZODONE 50 MG PO TAB: 50 mg | ORAL | 30 days supply | Qty: 30 | Fill #1 | Status: CP

## 2021-06-11 ENCOUNTER — Encounter: Admit: 2021-06-11 | Discharge: 2021-06-11 | Payer: Commercial Managed Care - HMO

## 2021-06-11 MED ORDER — DICYCLOMINE 20 MG PO TAB
20 mg | ORAL_TABLET | Freq: Four times a day (QID) | ORAL | 3 refills | PRN
Start: 2021-06-11 — End: ?

## 2021-06-11 NOTE — Telephone Encounter
Refill request received for dicyclomine  Last OV 03/14/21  Last fill 02/25/21 120 tabs w/3 refills    Routing to Vela Prose, APRN for approval/refusal

## 2021-06-14 ENCOUNTER — Encounter: Admit: 2021-06-14 | Discharge: 2021-06-14 | Payer: Commercial Managed Care - HMO

## 2021-06-14 NOTE — Telephone Encounter
1st attempt

## 2021-06-17 ENCOUNTER — Encounter: Admit: 2021-06-17 | Discharge: 2021-06-17 | Payer: Commercial Managed Care - HMO

## 2021-06-17 ENCOUNTER — Ambulatory Visit: Admit: 2021-06-17 | Discharge: 2021-06-18 | Payer: Commercial Managed Care - HMO

## 2021-06-17 DIAGNOSIS — K5000 Crohn's disease of small intestine without complications: Secondary | ICD-10-CM

## 2021-06-17 DIAGNOSIS — M797 Fibromyalgia: Secondary | ICD-10-CM

## 2021-06-17 DIAGNOSIS — K5090 Crohn's disease, unspecified, without complications: Secondary | ICD-10-CM

## 2021-06-17 DIAGNOSIS — F99 Mental disorder, not otherwise specified: Secondary | ICD-10-CM

## 2021-06-17 DIAGNOSIS — K219 Gastro-esophageal reflux disease without esophagitis: Secondary | ICD-10-CM

## 2021-06-17 DIAGNOSIS — E785 Hyperlipidemia, unspecified: Secondary | ICD-10-CM

## 2021-06-17 DIAGNOSIS — K21 Gastroesophageal reflux disease with esophagitis without hemorrhage: Secondary | ICD-10-CM

## 2021-06-17 DIAGNOSIS — D751 Secondary polycythemia: Secondary | ICD-10-CM

## 2021-06-17 DIAGNOSIS — E119 Type 2 diabetes mellitus without complications: Secondary | ICD-10-CM

## 2021-06-17 DIAGNOSIS — R12 Heartburn: Secondary | ICD-10-CM

## 2021-06-17 DIAGNOSIS — E039 Hypothyroidism, unspecified: Secondary | ICD-10-CM

## 2021-06-17 NOTE — Progress Notes
Telehealth Visit Note    Date of Service: 06/17/2021    Subjective:      Obtained patient's verbal consent to treat them and their agreement to [facility] financial policy and NPP via this telehealth visit during the [facility] Emergency       Alan [name] is a 40 y.o. male.    History of Present Illness      [name] is a pleasant 40 y.o. male patient of Dr. Buckles  with hx of Crohn's, involving the TI, currently on Stelara 90mg  every 8 weeks.  He underwent EGD and Colonoscopy 12/2020 to evaluate mucosal healing and response to Stelara.  He had evidence of active focal ileitis, and he was referred to CRS for consult to discuss TI resection. MRE 03/2021 showed no active small bowel inflammation, and resolved inflammation seen previously in TI.     He presents today for follow up. His last drug levels were checked 03/25/2021 on the day of his Stelara injection. Drug level was low at 0.8. No antibodies were detected. He does report ongoing abdominal pain and diarrhea. He denies blood in his stool. His bowel pattern over the past month is to be constipated for a few days and then have large liquid bowel movement, then go back to being constipated. He will use dulcolax as needed for constipation. He does Stelara injection every 8 weeks.            Review of Systems   Constitutional: Negative.    HENT: Negative.    Eyes: Negative.    Respiratory: Negative.    Cardiovascular: Negative.    Gastrointestinal: Positive for abdominal distention, abdominal pain and diarrhea.   Endocrine: Negative.    Genitourinary: Negative.    Skin: Negative.    Allergic/Immunologic: Negative.    Neurological: Negative.    Hematological: Negative.    Psychiatric/Behavioral: Negative.    All other systems reviewed and are negative.        Objective:           Medical History  Medical History:   Diagnosis Date   ? Acid reflux    ? Crohn's disease (HCC)    ? Fibromyalgia    ? Heartburn    ? Hyperlipidemia    ? Hypothyroid    ? Polycythemia, secondary    ? Psychiatric illness    ? Type II diabetes mellitus (HCC)         ? atogepant (QULIPTA) 60 mg tablet Take 60 mg by mouth.   ? BD ULTRA-FINE SHORT PEN NEEDLE 31 gauge x 5/16 pen needle USE TO INJECT INSULIN 4 TIMES A DAY   ? cetirizine (ZYRTEC) 10 mg tablet Daily   ? clonazePAM (KLONOPIN) 1 mg tablet TAKE 1 TABLET BY MOUTH TWICE A DAY AS NEEDED FOR ANXIETY   ? cyanocobalamin (VITAMIN B-12) 100 mcg tablet Take 100 mcg by mouth daily.   ? dicyclomine (BENTYL) 20 mg tablet TAKE ONE TABLET BY MOUTH FOUR TIMES DAILY AS NEEDED   ? diphenhydrAMINE (BENADRYL) 25 mg capsule 1 Cap, QID, PO, PRN, Allergy   ? famotidine (PEPCID) 20 mg tablet Take 20 mg by mouth daily.   ? FARXIGA 10 mg tablet Take 10 mg by mouth every morning.   ? HUMALOG KWIKPEN INSULIN 100 unit/mL subcutaneous PEN Inject four units under the skin three times daily with meals. Correct for blood sugar over 220. BG 221-260: give 2 units. BG 261-300 give 4 units. BG 301-350: give 6 units. 351-400: 8  units. >400: 10 units   ? hydrOXYzine HCL (ATARAX) 25 mg tablet Take one tablet by mouth every 6 hours as needed. Indications: anxious   ? L-Methylfolate (DEPLIN 15) 15 mg tablet Take 15 mg by mouth daily.   ? levothyroxine (SYNTHROID) 25 mcg tablet Take 25 mcg by mouth daily 30 minutes before breakfast.   ? methylprednisolone (MEDROL) 32 mg tablet Take one tablet by mouth as directed. Take 32mg  by mouth 12 hours before appointment, then take 32mg  by mouth 2 hours before appointment time   ? methylprednisolone (MEDROL) 32 mg tablet Take one tablet by mouth as directed. Take 32mg  by mouth 12 hours before appointment, then take 32mg  by mouth 2 hours before appointment time   ? nortriptyline (PAMELOR) 50 mg capsule Take one capsule by mouth at bedtime daily.   ? pantoprazole DR (PROTONIX) 40 mg tablet Take one tablet by mouth twice daily.   ? peg-electrolyte solution (NULYTELY LEMON-LIME) 420 gram oral solution Split dose by mouth as directed by GI office   ? peg3350-sod sul-NaCl-KCl-asb-C (PLENVU) 140-9-5.2 gram ppks Take 1 each by mouth as directed. Split dose   ? rosuvastatin (CRESTOR) 40 mg tablet Take 40 mg by mouth daily.   ? SUMAtriptan succinate (IMITREX) 25 mg tablet    ? traZODone (DESYREL) 50 mg tablet Take one tablet by mouth at bedtime as needed. Indications: insomnia associated with depression   ? TRESIBA FLEXTOUCH U-200 200 unit/mL (3 mL) injection PEN INJECT 50 UNITS SUBCUTANEOUSLY DAILY   ? ustekinumab (STELARA) 90 mg syringe Inject 1 mL under the skin every 8 weeks. Indications: Crohn's disease   ? VASCEPA 1 gram capsule TAKE 2 CAPSULES BY MOUTH TWICE A DAY   ? venlafaxine XR (EFFEXOR XR) 75 mg capsule Take one capsule by mouth daily. Take with food.          Telehealth Patient Reported Vitals     Row Name 06/17/21 1313                Weight: 95.3 kg (210 lb)        Height: 175.3 cm (5' 9)        Pain Score: Four        Pain Location: ABDOMEN                  Telehealth Body Mass Index: 31.01 at 06/17/2021  1:16 PM    Physical Exam  Constitutional:       Appearance: Normal appearance. He is not ill-appearing.   Neurological:      Mental Status: He is alert.   Psychiatric:         Attention and Perception: Attention normal.         Mood and Affect: Mood normal.         Speech: Speech normal.         Behavior: Behavior normal. Behavior is cooperative.         Limited physical exam due to the nature of virtual visit.        Assessment and Plan:    1. Crohn's disease involving terminal ileum (HCC)  - Recent MRE is without active inflammation, but Stelara drug level is low. Discussed with Dr. Janene Madeira to increase Stelara injection frequency from Q8 weeks to Q6 weeks.    2. 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

## 2021-06-17 NOTE — Patient Instructions
Patient Instructions:    - Dr. Janene Madeira recommended increasing Stelara injection frequency to every 6 weeks. One of our pharmacists will be in contact with you regarding this change.    - Continue pantoprazole 40mg  twice daily for reflux      Please contact my nurse via MyChart or at [phone] with any questions.     General Instructions:  To have a medication refilled:  Please use the MyChart Refill request or contact your pharmacy directly to request medication refills.  Please allow 72 hours.   If you have internet access/ smartphone please contact me through MyChart. This is our preferred method of contact and the most efficient way to contact your healthcare team.  Medical Office Building Lab is on the 1st floor. It is open from 7 am-6pm Tuesday-Friday and 6:30am-7pm on Mondays, and 7 am - Noon on Saturdays    [facility] [place] [facility] is located on the 2nd floor and is open 8 am-5 pm Monday-Friday   [facility] location lab is located next to the check out desk and is open from 8 AM to 4:45 PM Monday through Friday.   Radiology is on the 2nd floor of the Medical Office Building and the 2nd Floor of [facility]. Radiology Scheduling can be reached at [phone]  To Schedule office visits:  Call [phone].  Option 1.   To Schedule an appointment with the dietician please call [phone]. Please visit [email or web address] for help finding a dietician in your area.   If you are being referred to our pelvic floor rehab provider please call [phone] to schedule your first appointment. You will need to call them, they will not call you.    For procedure scheduling questions at the [facility] or [place] please call [phone]; for a procedure at [facility] please call [phone].  To schedule your appointment with pre-anesthesia clinic please call [phone]. Please make sure when you call you have your procedure appointment already made.  Support for many chronic illnesses is available through [employer]: [email or web address] or [phone].  For urgent questions on nights, weekends or holidays, call the Operator at [phone], and ask for the doctor on call for Gastroenterology. Call 911 for any emergencies.  If you need help with accessing your MyChart please call our help desk at [phone]  Are you a medicare patient? Are you in the in the donut hole? Are you a medicare patient having trouble getting your medication? Please call [phone] or you can go to Medicare.gov    As part of the CARES act, starting April 1st some results are released to you automatically. Your provider will continue to send you a detailed result note on any labs that they order, but with these changes you may see your results before they do. Critical lab results will be addressed immediately, but otherwise please give your provider 72 hours (3 business days) to view and respond to your results before reaching out with any questions. Depending on your questions, they may ask you to schedule a telehealth or telephone visit to discuss further. This visit may be billed to your insurance depending on time and complexity.

## 2021-06-18 ENCOUNTER — Encounter: Admit: 2021-06-18 | Discharge: 2021-06-18 | Payer: Commercial Managed Care - HMO

## 2021-06-18 MED ORDER — STELARA 90 MG/ML SC SYRG
90 mg | SUBCUTANEOUS | 2 refills | 38.50000 days | Status: AC
Start: 2021-06-18 — End: ?

## 2021-06-19 ENCOUNTER — Encounter: Admit: 2021-06-19 | Discharge: 2021-06-19 | Payer: Commercial Managed Care - HMO

## 2021-06-25 ENCOUNTER — Encounter: Admit: 2021-06-25 | Discharge: 2021-06-25 | Payer: Commercial Managed Care - HMO

## 2021-06-25 NOTE — Progress Notes
The Prior Authorization for Stelara every 4 weeks has been submitted for Alan [name] via Cover My Meds.  Will continue to follow.    Phylliss Blakes  Pharmacy Patient Advocate  [phone]

## 2021-06-26 ENCOUNTER — Encounter: Admit: 2021-06-26 | Discharge: 2021-06-26 | Payer: Commercial Managed Care - HMO

## 2021-07-03 ENCOUNTER — Encounter: Admit: 2021-07-03 | Discharge: 2021-07-03 | Payer: Commercial Managed Care - HMO

## 2021-07-04 ENCOUNTER — Encounter: Admit: 2021-07-04 | Discharge: 2021-07-04 | Payer: Commercial Managed Care - HMO

## 2021-07-04 NOTE — Progress Notes
The Prior Authorization for Alan [name] was approved for Alan [name] from 06/30/2021 to 06/25/2022. The PA authorization number is [id number].    Alan [name]'s prescription for Alan [name] is not able to be filled by [facility] because the patient's prescription insurance mandates them to fill at the insurance plan's preferred outside pharmacy.  Our pharmacy will work with clinic so that their prescription is sent to [facility] ([phone]) as required by their prescription insurance coverage.    Phylliss Blakes  Pharmacy Patient Advocate  [phone]

## 2021-07-04 NOTE — Telephone Encounter
Attempted to contact patient regarding Stelara every 4 week injection approval. Infusion reload is still pending at this time.     No answer. Left voicemail requesting call back at [phone].     Mliss Fritz, PHARMD

## 2021-07-07 ENCOUNTER — Encounter: Admit: 2021-07-07 | Discharge: 2021-07-07 | Payer: Commercial Managed Care - HMO

## 2021-07-09 ENCOUNTER — Encounter: Admit: 2021-07-09 | Discharge: 2021-07-09 | Payer: Commercial Managed Care - HMO

## 2021-07-11 ENCOUNTER — Encounter: Admit: 2021-07-11 | Discharge: 2021-07-11 | Payer: Commercial Managed Care - HMO

## 2021-07-15 ENCOUNTER — Encounter: Admit: 2021-07-15 | Discharge: 2021-07-15 | Payer: Commercial Managed Care - HMO

## 2021-07-15 MED ORDER — STELARA 90 MG/ML SC SYRG
90 mg | SUBCUTANEOUS | 0 refills | 38.50000 days | Status: AC
Start: 2021-07-15 — End: ?

## 2021-07-15 NOTE — Telephone Encounter
Called patient and discussed plan to reload Stelara and increase to every 4 weeks due to low drug level. Reload infusion is still pending at this time. Every 4 week injections will be sent to [facility] today so patient can fill. He is due for a dose at the 8 week frequency tomorrow, 1/18 and doesn't have this in his possession.     Will send Stelara every 4 week injections to [facility] and route to provide for cosignature. Will follow up with infusion center on Stelara reload and notify patient once insurance decision.     Mliss Fritz, PHARMD

## 2021-07-17 ENCOUNTER — Encounter: Admit: 2021-07-17 | Discharge: 2021-07-17 | Payer: Commercial Managed Care - HMO

## 2021-07-17 NOTE — Progress Notes
Pharmacy Medication Re-assessment    Indication/Regimen  The regimen of STELARA SC indefinitely is appropriate for Alan [name] who has Crohn's disease involving terminal ileum (HCC).    Renal dose adjustments are not required. Hepatic dose adjustments are not required. Dose titration is required.    The patient has the ability to self-administer the medication(s).    Baseline Characteristics  Disease severity: moderate-severe  Disease description: ileitis    Therapeutic Goals and Monitoring  The goal of therapy is to control symptoms and achieve remission.    Disease activity (patient reported): often active  Patient reported improvement: 25-49 percent improvement in symptoms since starting therapy  Clinical symptoms: diarrhea, abdominal pain and cramping  Bowel movements per day: 4-6  Hospitalization visit for IBD (past 90 days): 0  ED visit for IBD (past 90 days): 0  Patient is not currently in clinical remission. Patient notes no overall improvement but is currently in the process of increasing Stelara dose due to low drug level (Reload + every 4 weeks)    Endoscopic Evaluation  Endoscopic procedure completed: yes  Evaluation: stable  Remission: not achieved  Colonoscopy July 2022: small aphtha in the terminal ileum, EGD normal    Histologic Evaluation  Test completed: yes  Evaluation: stable  Remission: not achieved  Biopsies showed normal TI, but active duodenitis    Evaluation  The patient is not making progress toward achieving their therapeutic goal, due to the following reasons: dose adjustment needed. Patient is currently undergoing dose increase. Will re-evaluate response as appropriate following dose change The plan is to continue current therapy.    Past Medical History and Comorbidities  Patient Active Problem List   Diagnosis   ? Crohn's disease involving terminal ileum (HCC)   ? Moderate episode of recurrent major depressive disorder (HCC)   ? Generalized anxiety disorder with panic attacks Additional comorbidities: yes  Medical History:  No date: Acid reflux  No date: Crohn's disease (HCC)  No date: Fibromyalgia  No date: Heartburn  No date: Hyperlipidemia  No date: Hypothyroid  No date: Polycythemia, secondary  No date: Psychiatric illness  No date: Type II diabetes mellitus (HCC)      Labs and Diagnostic Tests  No results found for: TSPOTTB, QUANTIFERTB  No results found for: HEPBSAG, HEPBEAG, HEPBSURABQT, HEPBSAB   Lab Results   Component Value Date/Time    RBC 5.42 06/03/2021 11:26 PM    HGB 16.5 06/03/2021 11:26 PM    HCT 47.9 06/03/2021 11:26 PM    MCV 88.5 06/03/2021 11:26 PM    MCH 30.4 06/03/2021 11:26 PM    MCHC 34.4 06/03/2021 11:26 PM    RDW 14.9 06/03/2021 11:26 PM    PLTCT 250 06/03/2021 11:26 PM    MPV 7.1 06/03/2021 11:26 PM         Allergies  Allergies   Allergen Reactions   ? Azathioprine RASH   ? Chlorhexidine RASH   ? Iodine And Iodide Containing Products RASH   ? Chlorphen-Phenyleph-Hydrocodon ITCHING   ? Gadolinium-Containing Contrast Media FLUSHING (SKIN)     Pt reported has had reaction in past x2 at other location. Pt reports was given benadryl with 2nd reaction.      ? Oxycodone ITCHING        Immunizations  Vaccine history was reviewed with the patient. Education was provided on the importance of completing vaccines, including annual influenza vaccine. The patient should avoid live vaccines.    Immunization History   Administered Date(s) Administered   ?  COVID-19 (PFIZER), mRNA vacc, 30 mcg/0.3 mL (PF) 02/28/2020       Home Medications    Medication Sig   atogepant (QULIPTA) 60 mg tablet Take 60 mg by mouth.   BD ULTRA-FINE SHORT PEN NEEDLE 31 gauge x 5/16 pen needle USE TO INJECT INSULIN 4 TIMES A DAY   cetirizine (ZYRTEC) 10 mg tablet Daily   clonazePAM (KLONOPIN) 1 mg tablet TAKE 1 TABLET BY MOUTH TWICE A DAY AS NEEDED FOR ANXIETY   cyanocobalamin (VITAMIN B-12) 100 mcg tablet Take 100 mcg by mouth daily.   dicyclomine (BENTYL) 20 mg tablet TAKE ONE TABLET BY MOUTH FOUR TIMES DAILY AS NEEDED   diphenhydrAMINE (BENADRYL) 25 mg capsule 1 Cap, QID, PO, PRN, Allergy   famotidine (PEPCID) 20 mg tablet Take 20 mg by mouth daily.   FARXIGA 10 mg tablet Take 10 mg by mouth every morning.   HUMALOG KWIKPEN INSULIN 100 unit/mL subcutaneous PEN Inject four units under the skin three times daily with meals. Correct for blood sugar over 220. BG 221-260: give 2 units. BG 261-300 give 4 units. BG 301-350: give 6 units. 351-400: 8 units. >400: 10 units   hydrOXYzine HCL (ATARAX) 25 mg tablet Take one tablet by mouth every 6 hours as needed. Indications: anxious   L-Methylfolate (DEPLIN 15) 15 mg tablet Take 15 mg by mouth daily.   levothyroxine (SYNTHROID) 25 mcg tablet Take 25 mcg by mouth daily 30 minutes before breakfast.   methylprednisolone (MEDROL) 32 mg tablet Take one tablet by mouth as directed. Take 32mg  by mouth 12 hours before appointment, then take 32mg  by mouth 2 hours before appointment time   methylprednisolone (MEDROL) 32 mg tablet Take one tablet by mouth as directed. Take 32mg  by mouth 12 hours before appointment, then take 32mg  by mouth 2 hours before appointment time   nortriptyline (PAMELOR) 50 mg capsule Take one capsule by mouth at bedtime daily.   pantoprazole DR (PROTONIX) 40 mg tablet Take one tablet by mouth twice daily.   peg-electrolyte solution (NULYTELY LEMON-LIME) 420 gram oral solution Split dose by mouth as directed by GI office   peg3350-sod sul-NaCl-KCl-asb-C (PLENVU) 140-9-5.2 gram ppks Take 1 each by mouth as directed. Split dose   rosuvastatin (CRESTOR) 40 mg tablet Take 40 mg by mouth daily.   SUMAtriptan succinate (IMITREX) 25 mg tablet    traZODone (DESYREL) 50 mg tablet Take one tablet by mouth at bedtime as needed. Indications: insomnia associated with depression   TRESIBA FLEXTOUCH U-200 200 unit/mL (3 mL) injection PEN INJECT 50 UNITS SUBCUTANEOUSLY DAILY   ustekinumab (STELARA) 90 mg syringe Inject 1 mL under the skin every 28 days. Indications: Crohn's disease   VASCEPA 1 gram capsule TAKE 2 CAPSULES BY MOUTH TWICE A DAY   venlafaxine XR (EFFEXOR XR) 75 mg capsule Take one capsule by mouth daily. Take with food.     Medication Reconciliation  Medication history and reconciliation were performed (including prescription medications, supplements, over the counter, and herbal products). The medication list was updated and the patient's current medication list is included above. The patient was instructed to speak with their health care provider before starting any new drug, including prescription or over the counter, natural / herbal products, or vitamins.    Drug Interactions    Drug-Drug Interactions  Drug-drug interactions were evaluated. There were not clinically significant drug-drug interactions.     Drug-Food Interactions  Drug-food interactions were not evaluated (NA - not oral).    Adverse Drug Reactions  Adverse drug reactions were reviewed with the patient.    Significant adverse drug reaction(s) were not identified.    Side effect(s) were not reported.    The patient does not have injection related concerns.    The patient does not have infection related concerns.    Adherence  Refill and adherence history were reviewed with the patient. The patient was educated on the importance of adherence.    Patient is adherent with refills: yes  Patient is meeting refill adherence goal: yes    Patient reported 0 missed doses  Significance of missed doses: NA - no missed doses   Patient is meeting reported adherence goal.    Safety Precautions    Risk Evaluation and Mitigation (REMS) Assessment: REMS is not required for this medication.    Safety precautions were addressed and discussed with the patient as applicable.    Contraindications: Alan [name] does not have contraindications to this medication.      Pregnancy Status: Male, education not applicable.    Medication Education  The patient was counseled via telephone. 10 minutes were spent educating the patient.    Educated on dose change/increase and plan moving forward    Alan [name] was given the opportunity to ask questions but did not have any questions at the time. Patient was reminded of the refill process and encouraged to call with questions. The monitoring and follow-up plan was discussed with the patient. The patient was instructed to contact their health care provider if their symptoms or health problems do not get better or if they become worse. The patient should contact the specialty pharmacy at [phone] if they have any questions or concerns regarding their medication therapy. The patient verbalized acceptance and understanding.    Follow-up Plan  The patient will be reassessed within 1 year.    The medication(s) will be received from an outside pharmacy ([facility]) based on insurance mandate.    Mliss Fritz, PHARMD

## 2021-08-07 ENCOUNTER — Encounter: Admit: 2021-08-07 | Discharge: 2021-08-07 | Payer: Commercial Managed Care - HMO

## 2021-08-11 ENCOUNTER — Encounter: Admit: 2021-08-11 | Discharge: 2021-08-11 | Payer: Commercial Managed Care - HMO

## 2021-08-13 ENCOUNTER — Encounter: Admit: 2021-08-13 | Discharge: 2021-08-13 | Payer: Commercial Managed Care - HMO

## 2021-08-29 ENCOUNTER — Encounter: Admit: 2021-08-29 | Discharge: 2021-08-29 | Payer: Commercial Managed Care - HMO

## 2021-08-29 ENCOUNTER — Ambulatory Visit: Admit: 2021-08-29 | Discharge: 2021-08-30 | Payer: Commercial Managed Care - HMO

## 2021-08-29 VITALS — BP 117/71 | HR 101 | Resp 18 | Ht 70.0 in | Wt 205.0 lb

## 2021-08-29 DIAGNOSIS — K219 Gastro-esophageal reflux disease without esophagitis: Secondary | ICD-10-CM

## 2021-08-29 DIAGNOSIS — D751 Secondary polycythemia: Secondary | ICD-10-CM

## 2021-08-29 DIAGNOSIS — K5000 Crohn's disease of small intestine without complications: Principal | ICD-10-CM

## 2021-08-29 DIAGNOSIS — R140 Abdominal distension (gaseous): Secondary | ICD-10-CM

## 2021-08-29 DIAGNOSIS — R7989 Other specified abnormal findings of blood chemistry: Secondary | ICD-10-CM

## 2021-08-29 DIAGNOSIS — R1031 Right lower quadrant pain: Secondary | ICD-10-CM

## 2021-08-29 DIAGNOSIS — K5090 Crohn's disease, unspecified, without complications: Secondary | ICD-10-CM

## 2021-08-29 DIAGNOSIS — E039 Hypothyroidism, unspecified: Secondary | ICD-10-CM

## 2021-08-29 DIAGNOSIS — M797 Fibromyalgia: Secondary | ICD-10-CM

## 2021-08-29 DIAGNOSIS — R142 Eructation: Secondary | ICD-10-CM

## 2021-08-29 DIAGNOSIS — R143 Flatulence: Secondary | ICD-10-CM

## 2021-08-29 DIAGNOSIS — K21 Gastroesophageal reflux disease with esophagitis without hemorrhage: Secondary | ICD-10-CM

## 2021-08-29 DIAGNOSIS — E785 Hyperlipidemia, unspecified: Secondary | ICD-10-CM

## 2021-08-29 DIAGNOSIS — F99 Mental disorder, not otherwise specified: Secondary | ICD-10-CM

## 2021-08-29 DIAGNOSIS — E119 Type 2 diabetes mellitus without complications: Secondary | ICD-10-CM

## 2021-08-29 DIAGNOSIS — F331 Major depressive disorder, recurrent, moderate: Secondary | ICD-10-CM

## 2021-08-29 DIAGNOSIS — R12 Heartburn: Secondary | ICD-10-CM

## 2021-08-29 NOTE — Progress Notes
Date of Service: 08/29/2021    Subjective:             Alan [name] is a 41 y.o. male patient of Dr. Buckles, with hx of Crohn's, involving the TI, currently on Stelara 90mg  every 6 weeks.  He underwent EGD and Colonoscopy 12/2020 to evaluate mucosal healing and response to Stelara.  He had evidence of active focal ileitis, and he was referred to CRS for consult to discuss TI resection. MRE 03/2021 showed no active small bowel inflammation, and resolved inflammation seen previously in TI.      LOV 06/17/21 by Vela Prose,  for Crohn's FU. Recent MRE is without active inflammation, but Stelara drug level is low. Discussed with Dr. Janene Madeira to increase Stelara injection frequency from Q8 weeks to Q6 weeks. Continue pantoprazole 40mg  twice daily for GERD, and RTC in 3 months to discuss response to new Stelara injection frequency.    RTC 3 months with Ralph Leyden, APRN to discuss response to new Stelara injection frequency     History of Present Illness  Changed frequency of Stelara to every 4 weeks; insurance denied reloading dose of Stelara. Letter of medical necessity sent, is pending as of today (08/29/21). Initial request for reloading dose was denied.     Last dose of Stelara was 08/06/21; due to receive next dose today if it arrives.    Currently having 0-10 BM/day; BSFS type 3-4, strains, cramping, most of the time.  Has had 3 episodes of blood in stools in the last couple of weeks.  Currently taking Miralax 1 capful every other day.   Denies relief of abd pain since increasing frequency- has only had 1 dose at 6 weeks.  Still has abd pain on right side, chronic, no relief.    CRP normal at 0.95 on 03/25/21.   C/o increased belching, gas. States he burps and has a foul odor and alters his taste buds for foods.    He reports having had some genetic testing that checked the metabolism of his psych meds, and was told that he didn't absorb meds like he should- wondering if that may be applying to his Stelara as well. Stelara level 0.8 mcg/mL on 03/25/2021, no antibodies detected.    Filed for disability again.       Review of Systems   Constitutional: Negative.    HENT: Negative.    Eyes: Negative.    Respiratory: Negative.    Cardiovascular: Negative.    Gastrointestinal: Positive for abdominal pain (RUQ), blood in stool, constipation and diarrhea.   Endocrine: Negative.    Genitourinary: Positive for frequency.   Musculoskeletal: Negative.    Skin: Negative.    Allergic/Immunologic: Negative.    Neurological: Negative.    Hematological: Negative.    Psychiatric/Behavioral: Negative.    All other systems reviewed and are negative.        Objective:         ? atogepant (QULIPTA) 60 mg tablet Take one tablet by mouth.   ? BD ULTRA-FINE SHORT PEN NEEDLE 31 gauge x 5/16 pen needle USE TO INJECT INSULIN 4 TIMES A DAY   ? cetirizine (ZYRTEC) 10 mg tablet Daily   ? clonazePAM (KLONOPIN) 1 mg tablet TAKE 1 TABLET BY MOUTH TWICE A DAY AS NEEDED FOR ANXIETY   ? cyanocobalamin (VITAMIN B-12) 100 mcg tablet Take 100 mcg by mouth daily.   ? dicyclomine (BENTYL) 20 mg tablet TAKE ONE TABLET BY MOUTH FOUR TIMES DAILY AS NEEDED   ?  diphenhydrAMINE (BENADRYL) 25 mg capsule 1 Cap, QID, PO, PRN, Allergy   ? famotidine (PEPCID) 20 mg tablet Take one tablet by mouth daily.   ? FARXIGA 10 mg tablet Take one tablet by mouth every morning.   ? HUMALOG KWIKPEN INSULIN 100 unit/mL subcutaneous PEN Inject four units under the skin three times daily with meals. Correct for blood sugar over 220. BG 221-260: give 2 units. BG 261-300 give 4 units. BG 301-350: give 6 units. 351-400: 8 units. >400: 10 units   ? hydrOXYzine HCL (ATARAX) 25 mg tablet Take one tablet by mouth every 6 hours as needed. Indications: anxious   ? L-Methylfolate (DEPLIN 15) 15 mg tablet Take one tablet by mouth daily.   ? levothyroxine (SYNTHROID) 25 mcg tablet Take one tablet by mouth daily 30 minutes before breakfast.   ? methylprednisolone (MEDROL) 32 mg tablet Take one tablet by mouth as directed. Take 32mg  by mouth 12 hours before appointment, then take 32mg  by mouth 2 hours before appointment time   ? methylprednisolone (MEDROL) 32 mg tablet Take one tablet by mouth as directed. Take 32mg  by mouth 12 hours before appointment, then take 32mg  by mouth 2 hours before appointment time   ? nortriptyline (PAMELOR) 50 mg capsule Take one capsule by mouth at bedtime daily.   ? pantoprazole DR (PROTONIX) 40 mg tablet Take one tablet by mouth twice daily.   ? peg-electrolyte solution (NULYTELY LEMON-LIME) 420 gram oral solution Split dose by mouth as directed by GI office   ? peg3350-sod sul-NaCl-KCl-asb-C (PLENVU) 140-9-5.2 gram ppks Take 1 each by mouth as directed. Split dose   ? rosuvastatin (CRESTOR) 40 mg tablet Take one tablet by mouth daily.   ? SUMAtriptan succinate (IMITREX) 25 mg tablet    ? traZODone (DESYREL) 50 mg tablet Take one tablet by mouth at bedtime as needed. Indications: insomnia associated with depression   ? TRESIBA FLEXTOUCH U-200 200 unit/mL (3 mL) injection PEN INJECT 50 UNITS SUBCUTANEOUSLY DAILY   ? ustekinumab (STELARA) 90 mg syringe Inject 1 mL under the skin every 28 days. Indications: Crohn's disease   ? VASCEPA 1 gram capsule TAKE 2 CAPSULES BY MOUTH TWICE A DAY   ? venlafaxine XR (EFFEXOR XR) 75 mg capsule Take one capsule by mouth daily. Take with food.     Vitals:    08/29/21 1049   BP: 117/71   BP Source: Arm, Left Upper   Pulse: 101   Resp: 18   PainSc: Two   Weight: 93 kg (205 lb)   Height: 177.8 cm (5' 10)     Body mass index is 29.41 kg/m?Marland Kitchen     Physical Exam  Vitals and nursing note reviewed.   Constitutional:       Appearance: Normal appearance.   HENT:      Head: Normocephalic.      Nose: Nose normal.   Eyes:      Extraocular Movements: Extraocular movements intact.      Pupils: Pupils are equal, round, and reactive to light.   Neck:      Thyroid: No thyromegaly.   Pulmonary:      Effort: Pulmonary effort is normal.      Breath sounds: Normal breath sounds.   Abdominal:      General: Bowel sounds are normal.      Palpations: Abdomen is soft.   Musculoskeletal:         General: Normal range of motion.      Cervical back: Normal  range of motion and neck supple.      Right lower leg: No edema.      Left lower leg: No edema.   Skin:     General: Skin is warm and dry.      Capillary Refill: Capillary refill takes less than 2 seconds.   Neurological:      General: No focal deficit present.      Mental Status: He is alert and oriented to person, place, and time.      Motor: Motor function is intact.      Coordination: Coordination is intact.      Gait: Gait is intact.   Psychiatric:         Attention and Perception: Attention normal.         Mood and Affect: Mood normal.         Speech: Speech normal.         Behavior: Behavior normal. Behavior is cooperative.              Assessment and Plan:  Alan [name] is a 41 y.o. male patient of Dr. Buckles, with hx of Crohn's, involving the TI, currently on Stelara 90mg  every 6 weeks.  He underwent EGD and Colonoscopy 12/2020 to evaluate mucosal healing and response to Stelara.  He had evidence of active focal ileitis, and he was referred to CRS for consult to discuss TI resection. MRE 03/2021 showed no active small bowel inflammation, and resolved inflammation seen previously in TI.      LOV 06/17/21 by Vela Prose,  for Crohn's FU. Recent MRE is without active inflammation, but Stelara drug level is low. Discussed with Dr. Janene Madeira to increase Stelara injection frequency from Q8 weeks to Q6 weeks. Continue pantoprazole 40mg  twice daily for GERD, and RTC in 3 months to discuss response to new Stelara injection frequency.     RTC 3 months with Ralph Leyden, APRN to discuss response to new Stelara injection frequency      He Changed frequency of Stelara to every 4 weeks; insurance denied reloading dose of Stelara. Letter of medical necessity sent, is pending as of today (08/29/21). Initial request for reloading dose was denied. Last dose of Stelara was 08/06/21; due to receive next dose today if it arrives.   Currently having 0-10 BM/day; BSFS type 3-4, strains, cramping, most of the time.  Has had 3 single episodes of blood in stools in the last couple of weeks.  Currently taking Miralax 1 capful every other day.   Denies relief of abd pain since increasing frequency- has only had 1 dose at 6 weeks.  Still has abd pain on right side, chronic, no relief.    CRP normal at 0.95 on 03/25/21.   C/o increased belching, gas. States he burps and has a foul odor and alters his taste buds for foods.    Filed for disability again.      Encounter Diagnoses   Name Primary?   ? Gastroesophageal reflux disease with esophagitis without hemorrhage Yes   ? Crohn's disease involving terminal ileum (HCC)    ? Low vitamin D level    ? Moderate episode of recurrent major depressive disorder (HCC)    ? Right lower quadrant abdominal pain    ? Flatulence, eructation and gas pain     .      1. Continue Stelara injections every 4 weeks.  2. Still awaiting consideration of denial of reloading dose from insurance company. Will update when we hear.  3. Check fecal calprotectin level to establish baseline with new Stelara frequency.  4. Schedule Hydrogen breath test to evaluate for SIBO/IMO with belching, abd pain; advised against empirically starting abx for possible SIBO/IMO without testing, due to hx of Crohn's and not wanting to rx abx if not necessary.  5. FU in 3 months with me, first available with Dr. Janene Madeira.     A total of 30 minutes were spent today on this encounter.  This includes preparing for the visit, reviewing medical records, obtaining relevant history, speaking to the patient, interpreting test results, ordering medications and tests and documenting the visit in the electronic health record.

## 2021-08-30 DIAGNOSIS — R1031 Right lower quadrant pain: Secondary | ICD-10-CM

## 2021-09-01 ENCOUNTER — Ambulatory Visit: Admit: 2021-09-01 | Discharge: 2021-09-01 | Payer: Commercial Managed Care - HMO

## 2021-09-01 DIAGNOSIS — R1031 Right lower quadrant pain: Secondary | ICD-10-CM

## 2021-09-01 DIAGNOSIS — R140 Abdominal distension (gaseous): Secondary | ICD-10-CM

## 2021-09-01 DIAGNOSIS — R142 Eructation: Secondary | ICD-10-CM

## 2021-09-01 DIAGNOSIS — K5000 Crohn's disease of small intestine without complications: Secondary | ICD-10-CM

## 2021-09-05 ENCOUNTER — Encounter: Admit: 2021-09-05 | Discharge: 2021-09-05 | Payer: Commercial Managed Care - HMO

## 2021-09-12 ENCOUNTER — Ambulatory Visit: Admit: 2021-09-12 | Discharge: 2021-09-12 | Payer: Commercial Managed Care - HMO

## 2021-09-12 ENCOUNTER — Encounter: Admit: 2021-09-12 | Discharge: 2021-09-12 | Payer: Commercial Managed Care - HMO

## 2021-09-12 DIAGNOSIS — R1031 Right lower quadrant pain: Secondary | ICD-10-CM

## 2021-09-12 DIAGNOSIS — K5000 Crohn's disease of small intestine without complications: Secondary | ICD-10-CM

## 2021-09-12 DIAGNOSIS — B3781 Candidal esophagitis: Secondary | ICD-10-CM

## 2021-09-12 DIAGNOSIS — R197 Diarrhea, unspecified: Secondary | ICD-10-CM

## 2021-09-12 LAB — OVA AND PARASITES, FECAL

## 2021-09-15 ENCOUNTER — Encounter: Admit: 2021-09-15 | Discharge: 2021-09-15 | Payer: Commercial Managed Care - HMO

## 2021-09-18 ENCOUNTER — Encounter: Admit: 2021-09-18 | Discharge: 2021-09-18 | Payer: Commercial Managed Care - HMO

## 2021-09-26 ENCOUNTER — Encounter: Admit: 2021-09-26 | Discharge: 2021-09-26 | Payer: Commercial Managed Care - HMO

## 2021-09-30 ENCOUNTER — Encounter: Admit: 2021-09-30 | Discharge: 2021-09-30 | Payer: Commercial Managed Care - HMO

## 2021-09-30 MED ORDER — CREON 36,000-114,000- 180,000 UNIT PO CPDR
ORAL_CAPSULE | ORAL | 1 refills | 30.00000 days | Status: AC
Start: 2021-09-30 — End: ?

## 2021-10-03 ENCOUNTER — Encounter: Admit: 2021-10-03 | Discharge: 2021-10-03 | Payer: Commercial Managed Care - HMO

## 2021-10-03 DIAGNOSIS — K5090 Crohn's disease, unspecified, without complications: Secondary | ICD-10-CM

## 2021-10-03 MED ORDER — STELARA 90 MG/ML SC SYRG
SUBCUTANEOUS | 11 refills | 38.50000 days | Status: AC
Start: 2021-10-03 — End: ?

## 2021-10-04 ENCOUNTER — Encounter: Admit: 2021-10-04 | Discharge: 2021-10-04 | Payer: Commercial Managed Care - HMO

## 2021-10-04 ENCOUNTER — Ambulatory Visit: Admit: 2021-10-04 | Discharge: 2021-10-04 | Payer: Commercial Managed Care - HMO

## 2021-10-04 DIAGNOSIS — K21 Gastroesophageal reflux disease with esophagitis without hemorrhage: Secondary | ICD-10-CM

## 2021-10-04 DIAGNOSIS — K5000 Crohn's disease of small intestine without complications: Secondary | ICD-10-CM

## 2021-10-04 DIAGNOSIS — R7989 Other specified abnormal findings of blood chemistry: Secondary | ICD-10-CM

## 2021-10-04 DIAGNOSIS — R143 Flatulence: Secondary | ICD-10-CM

## 2021-10-04 DIAGNOSIS — F331 Major depressive disorder, recurrent, moderate: Secondary | ICD-10-CM

## 2021-10-04 DIAGNOSIS — R1031 Right lower quadrant pain: Secondary | ICD-10-CM

## 2021-10-07 ENCOUNTER — Encounter: Admit: 2021-10-07 | Discharge: 2021-10-07 | Payer: Commercial Managed Care - HMO

## 2021-10-14 ENCOUNTER — Encounter: Admit: 2021-10-14 | Discharge: 2021-10-14 | Payer: Commercial Managed Care - HMO

## 2021-10-14 MED ORDER — CREON 36,000-114,000- 180,000 UNIT PO CPDR
ORAL_CAPSULE | ORAL | 1 refills | 30.00000 days | Status: AC
Start: 2021-10-14 — End: ?

## 2021-11-06 ENCOUNTER — Encounter: Admit: 2021-11-06 | Discharge: 2021-11-06 | Payer: Commercial Managed Care - HMO

## 2021-11-10 ENCOUNTER — Encounter: Admit: 2021-11-10 | Discharge: 2021-11-10 | Payer: Commercial Managed Care - HMO

## 2021-11-16 ENCOUNTER — Encounter: Admit: 2021-11-16 | Discharge: 2021-11-16 | Payer: Commercial Managed Care - HMO

## 2021-11-16 MED ORDER — DICYCLOMINE 20 MG PO TAB
20 mg | ORAL_TABLET | Freq: Four times a day (QID) | ORAL | 1 refills | PRN
Start: 2021-11-16 — End: ?

## 2021-11-26 ENCOUNTER — Encounter: Admit: 2021-11-26 | Discharge: 2021-11-26 | Payer: Commercial Managed Care - HMO

## 2021-11-26 DIAGNOSIS — R140 Abdominal distension (gaseous): Secondary | ICD-10-CM

## 2021-11-26 DIAGNOSIS — R1031 Right lower quadrant pain: Secondary | ICD-10-CM

## 2021-11-26 NOTE — Telephone Encounter
Order placed for 48-hour fecal fat test. Pt notified and given education about prep and diet.

## 2021-11-26 NOTE — Telephone Encounter
-----   Message from Amadeo Garnet, [place] sent at 11/25/2021  5:17 PM CDT -----  Fecal fat elevated.  Rec 48 hour collection for more reliable results and to clarify if malabsorption present. High fat diet for collection.

## 2021-11-27 ENCOUNTER — Encounter: Admit: 2021-11-27 | Discharge: 2021-11-27 | Payer: Commercial Managed Care - HMO

## 2021-12-10 ENCOUNTER — Encounter: Admit: 2021-12-10 | Discharge: 2021-12-10 | Payer: Commercial Managed Care - HMO

## 2021-12-10 ENCOUNTER — Ambulatory Visit: Admit: 2021-12-10 | Discharge: 2021-12-10 | Payer: Commercial Managed Care - HMO

## 2021-12-10 ENCOUNTER — Ambulatory Visit: Admit: 2021-12-10 | Discharge: 2021-12-11 | Payer: Commercial Managed Care - HMO

## 2021-12-10 DIAGNOSIS — K909 Intestinal malabsorption, unspecified: Secondary | ICD-10-CM

## 2021-12-10 DIAGNOSIS — R142 Eructation: Secondary | ICD-10-CM

## 2021-12-10 DIAGNOSIS — Z79899 Other long term (current) drug therapy: Secondary | ICD-10-CM

## 2021-12-10 DIAGNOSIS — Z8619 Personal history of other infectious and parasitic diseases: Secondary | ICD-10-CM

## 2021-12-10 DIAGNOSIS — K219 Gastro-esophageal reflux disease without esophagitis: Secondary | ICD-10-CM

## 2021-12-10 DIAGNOSIS — K5090 Crohn's disease, unspecified, without complications: Secondary | ICD-10-CM

## 2021-12-10 DIAGNOSIS — M797 Fibromyalgia: Secondary | ICD-10-CM

## 2021-12-10 DIAGNOSIS — E039 Hypothyroidism, unspecified: Secondary | ICD-10-CM

## 2021-12-10 DIAGNOSIS — R140 Abdominal distension (gaseous): Secondary | ICD-10-CM

## 2021-12-10 DIAGNOSIS — E119 Type 2 diabetes mellitus without complications: Secondary | ICD-10-CM

## 2021-12-10 DIAGNOSIS — D751 Secondary polycythemia: Secondary | ICD-10-CM

## 2021-12-10 DIAGNOSIS — F99 Mental disorder, not otherwise specified: Secondary | ICD-10-CM

## 2021-12-10 DIAGNOSIS — Z8719 Personal history of other diseases of the digestive system: Secondary | ICD-10-CM

## 2021-12-10 DIAGNOSIS — R1310 Dysphagia, unspecified: Secondary | ICD-10-CM

## 2021-12-10 DIAGNOSIS — K5000 Crohn's disease of small intestine without complications: Secondary | ICD-10-CM

## 2021-12-10 DIAGNOSIS — R197 Diarrhea, unspecified: Secondary | ICD-10-CM

## 2021-12-10 DIAGNOSIS — R1031 Right lower quadrant pain: Secondary | ICD-10-CM

## 2021-12-10 DIAGNOSIS — E785 Hyperlipidemia, unspecified: Secondary | ICD-10-CM

## 2021-12-10 DIAGNOSIS — R195 Other fecal abnormalities: Secondary | ICD-10-CM

## 2021-12-10 DIAGNOSIS — R12 Heartburn: Secondary | ICD-10-CM

## 2021-12-10 DIAGNOSIS — Z23 Encounter for immunization: Secondary | ICD-10-CM

## 2021-12-10 MED ORDER — PANCREAZE 37,000-97,300- 149,900 UNIT PO CPDR
1 | ORAL_CAPSULE | Freq: Three times a day (TID) | ORAL | 3 refills | 30.00000 days | Status: AC
Start: 2021-12-10 — End: ?

## 2021-12-10 MED ORDER — PEG-ELECTROLYTE SOLN 420 GRAM PO SOLR
0 refills | Status: AC
Start: 2021-12-10 — End: ?

## 2021-12-10 NOTE — Progress Notes
Telehealth Visit Note    Date of Service: 12/10/2021    Subjective:       Alan [name] is a 40 y.o. male.patient of Dr. Janene [name], with a  hx of IC Crohn's Disease currently on Sterla every 4 weeks, cholecystectomy (05/2019), DM2, fibromyalgia, polycythemia vera.  He was last evaluated by me on 08/29/21.  At that time, he was advised to continue Stelara injections, move frequency to every 4 weeks, reloading dose was denied by insurance.  Fecal calprotectin level ordered, encouraged to schedule hydrogen breath test to evaluate for SIBO/IMO, and follow-up in 3 to 4 months.  He presents today via telehealth for follow-up.    History of Present Illness  Alan [name] presents today via telehealth for Crohn's FU.  He feels about the same, continues to have RLQ abd pain, post-prandial abd pain and diarrhea, 3-5 episodes, watery, after each meal, depending on diet.  He is currently taking Stelara q4 weeks- has had 4 doses at 4 week intervals; next dose due on 12/16/21.; his Stelara level was low ; no changes.    Fecal fat- 324 on 09/11/21-a 48-hour fecal fat test was ordered, however he has not yet completed this or picked up supplies.  He had a hydrogen breath test scheduled, but was a no-show, he reports he forgot about the test.  Has not rescheduled.  Pancreatic elastase level was low at 141.  He was prescribed Creon, did not pick up, insurance prefers Pancreaze so will need to send that.    He complains of dysphagia with food only, drinks lots of water to get it down, states his last EGD he was prescribed medicine to take for 7 days, review of medical records shows prescription for fluconazole.  He is currently taking pantoprazole 40 mg twice daily.He was prescribed fluconazole 100 mg tablets 02/02/2020 100 mg daily x14 days for Candida.  He was again prescribed fluconazole on 09/12/2020, 100 mg daily x14 days.  He reports this helped with his dysphagia at that time, but dysphagia has returned in the last couple of months, discussed retreatment options.    Weight stable, fluctuates between 5#.   Eye exam- 1 month ago- looked ok; rx changed a little bit  Dermatology visit- never; denies new or changed skin rashes.  Immunizations- needs shingrix and pneumonia. Not sure where he is on vaccination update status; he didn't think he could get the shingles vax bc he wasn't 48 yet.    12/12/20- Vit D 21; not currently treated or supplemented.     His Stelara activity level was 0.8 mcg/mL on 03/25/2021.  No antibodies to Stelara detectable.  He reports having had some genetic testing that checked the metabolism of his psych meds, and was told that he didn't absorb meds like he should- wondering if that may be applying to his Stelara as well.     He?underwent EGD and Colonoscopy 12/2020?to evaluate mucosal healing and response to Stelara. ?He had evidence of active focal ileitis, and he was referred to CRS for consult to discuss TI resection.?MRE 03/2021 showed no active small bowel inflammation, and resolved inflammation seen previously in TI.?   ?  Stool studies 09/11/21-      Latest Reference Range & Units Most Recent   Collect Period-FF  Random   Unit: h   More reliable results can be obtained from a   timed collection. 48 and 72 hour collections   will give the most reliable results. Percent   Fat >20% in a random  collection is suggestive   of a fat malabsorption disorder and should be   confirmed with a timed collection.   [facility], [address], [address] [place],   [place], [place] [address]     10/04/21 11:24   Percent Fat  23 (H)  10/04/21 11:24   Weight-FF  28  10/04/21 11:24   Calprotectin, Fecal  324 (H)  09/11/21 12:00   Pancreatic Elastase-1  141 (L)  09/11/21 12:00   H. pylori Ag, Feces NEGA-NEGATIVE  NEGATIVE  09/11/21 12:00   Fecal Leukocytes  NEGATIVE BY LACTOFERRIN  09/11/21 12:00   Cryptosporidium  NEGATIVE FOR CRYPTOSPORIDIUM  09/11/21 12:00   Giardia EIA  NEGATIVE FOR GIARDIA (LAMBLIA) INTESTINALIS  09/11/21 12:00   (H): Data is abnormally high  (L): Data is abnormally low     Most Recent   Vitamin D(25-OH)Total 21  12/12/20 00:00          Review of Systems   Constitutional: Negative.    HENT: Negative.    Eyes: Negative.    Respiratory: Negative.    Cardiovascular: Negative.    Gastrointestinal: Positive for constipation and diarrhea.   Endocrine: Negative.    Genitourinary: Negative.    Musculoskeletal: Negative.    Skin: Negative.    Allergic/Immunologic: Negative.    Neurological: Negative.    Hematological: Negative.    Psychiatric/Behavioral: Negative.    All other systems reviewed and are negative.      .  Objective:         ? atogepant (QULIPTA) 60 mg tablet Take one tablet by mouth.   ? BD ULTRA-FINE SHORT PEN NEEDLE 31 gauge x 5/16 pen needle USE TO INJECT INSULIN 4 TIMES A DAY   ? cetirizine (ZYRTEC) 10 mg tablet Daily   ? clonazePAM (KLONOPIN) 1 mg tablet TAKE 1 TABLET BY MOUTH TWICE A DAY AS NEEDED FOR ANXIETY   ? cyanocobalamin (VITAMIN B-12) 100 mcg tablet Take 100 mcg by mouth daily.   ? dicyclomine (BENTYL) 20 mg tablet TAKE ONE TABLET BY MOUTH FOUR TIMES DAILY AS NEEDED   ? diphenhydrAMINE (BENADRYL) 25 mg capsule 1 Cap, QID, PO, PRN, Allergy   ? famotidine (PEPCID) 20 mg tablet Take one tablet by mouth daily.   ? FARXIGA 10 mg tablet Take one tablet by mouth every morning.   ? HUMALOG KWIKPEN INSULIN 100 unit/mL subcutaneous PEN Inject four units under the skin three times daily with meals. Correct for blood sugar over 220. BG 221-260: give 2 units. BG 261-300 give 4 units. BG 301-350: give 6 units. 351-400: 8 units. >400: 10 units   ? hydrOXYzine HCL (ATARAX) 25 mg tablet Take one tablet by mouth every 6 hours as needed. Indications: anxious   ? L-Methylfolate (DEPLIN 15) 15 mg tablet Take one tablet by mouth daily.   ? levothyroxine (SYNTHROID) 25 mcg tablet Take one tablet by mouth daily 30 minutes before breakfast.   ? lipase-protease-amylase (CREON) 36,000-114,000- 180,000 unit capsule Take three capsules by mouth three times daily with meals AND one capsule with snacks. May have 3 snacks daily   ? lipase-protease-amylase (PANCREAZE) 37,000-97,300- 149,900 unit capsule Take one capsule by mouth three times daily. Please use these instructions: Take two capsules WITH each meal, and one capsule with each snack that contains fat or protein. May have three snacks daily.   ? methylprednisolone (MEDROL) 32 mg tablet Take one tablet by mouth as directed. Take 32mg  by mouth 12 hours before appointment, then take  32mg  by mouth 2 hours before appointment time   ? methylprednisolone (MEDROL) 32 mg tablet Take one tablet by mouth as directed. Take 32mg  by mouth 12 hours before appointment, then take 32mg  by mouth 2 hours before appointment time   ? nortriptyline (PAMELOR) 50 mg capsule Take one capsule by mouth at bedtime daily.   ? ONETOUCH DELICA PLUS LANCET 33 gauge USE TO TEST BLOOD SUGAR 4 TIMES PER DAY.   ? OXcarbazepine (TRILEPTAL) 150 mg tablet Take one tablet by mouth twice daily.   ? pantoprazole DR (PROTONIX) 40 mg tablet Take one tablet by mouth twice daily.   ? peg-electrolyte solution (NULYTELY LEMON-LIME) 420 gram oral solution Split dose by mouth as directed by GI office   ? peg3350-sod sul-NaCl-KCl-asb-C (PLENVU) 140-9-5.2 gram ppks Take 1 each by mouth as directed. Split dose   ? prazosin (MINIPRESS) 1 mg capsule    ? rosuvastatin (CRESTOR) 40 mg tablet Take one tablet by mouth daily.   ? STELARA 90 mg/mL syringe INJECT 1 ML UNDER THE SKIN EVERY 28 DAYS FOR CROHNS DISEASE. DOSE INCREASE TO EVERY 4 WEEKS   ? SUMAtriptan succinate (IMITREX) 25 mg tablet    ? traZODone (DESYREL) 50 mg tablet Take one tablet by mouth at bedtime as needed. Indications: insomnia associated with depression   ? TRESIBA FLEXTOUCH U-200 200 unit/mL (3 mL) injection PEN INJECT 50 UNITS SUBCUTANEOUSLY DAILY   ? TRINTELLIX 20 mg tablet Take one tablet by mouth daily.   ? VASCEPA 1 gram capsule TAKE 2 CAPSULES BY MOUTH TWICE A DAY   ? venlafaxine XR (EFFEXOR XR) 75 mg capsule Take one capsule by mouth daily. Take with food.          Telehealth Patient Reported Vitals     Row Name 12/10/21 0950                Weight: 94.8 kg (209 lb)  PER PT        Height: 175.3 cm (5' 9)        Pain Score: Zero                  Telehealth Body Mass Index: 30.86 at 12/10/2021 12:03 PM    Physical Exam  Limited exam due to nature of telemedicine    No acute distress  No focal deficits  Alert and oriented x 3  Normal affect         Assessment and Plan:  Prem is a 41 year old male patient with a history of Crohn's disease, currently taking Stelara injections, every 4 weeks, has had about 4 doses on new dosing schedule.  A reloading dose due to low Stelara levels was denied by insurance.  He had been taking Stelara every 6 weeks, but remains symptomatic with elevated fecal Cal.  He was advised to schedule hydrogen breath test to evaluate for SIBO/IMO, he scheduled this testing, but forgot and did not show up, and has not been rescheduled.  Fecal fat was elevated at 23 on 10/04/2021, a 48-hour stool collection test was ordered, has not yet been collected.  Fecal calprotectin on 09/11/2021 was 324, and pancreatic elastase was down at 141.  He was prescribed Creon, but did not fill, this medication was not on formulary for his insurance, which prefers Pancreaze, which we will prescribe.  He has dysphagia, for the last couple of months, with food, does not have problems with pills or water.  He reports when this happened before, he was treated  with some medication for a week and it resolved.  Upon review of his records, he was prescribed fluconazole on 2 separate occasions for Candida of the esophagus.  Each course was 14 days, 100 mg of fluconazole.  Last time he was prescribed this medication was in March 2022.  Will discuss retreatment versus EGD to further evaluate, since this was more than a year ago.  Would advise scheduling colonoscopy to evaluate mucosal healing with every 4 week intervals of Stelara currently and has received 4 doses.  He is due for labs, including CBC, CMP, CRP, sed rate, repeat fecal Cal, 48-hour fecal fat testing, reschedule hydrogen breath test to evaluate for SIBO, advised patient to obtain vaccinations for pneumonia and shingles, offered clinic appointments in our clinic to obtain those vaccinations versus obtaining at PCP.  He has never seen a dermatologist, will refer, had an eye exam less than 1 month ago, no significant findings, his prescription did change slightly.  He denies any new or changed skin rashes and denies mouth ulcers.  He has a follow-up appointment scheduled with Dr. Janene [name] on 02/19/2022, which he is advised to keep.  Patient agrees with plan of care and will see him for follow-up.                     1. Crohn's disease involving terminal ileum (HCC)  -Continue Stelara 90 mg every 4 weeks, next dose due 12/16/2021.  Obtain labs  -schedule EGD and colonoscopy to reevaluate for Candida esophagitis, esophageal stricture, with possible dilation.  Colonoscopy to evaluate mucosal healing after increasing dose of Stelara to every 4 weeks.  -Start Pancreaze, 2 capsules with meals, 1 with snacks that contain fat or protein.  - lipase-protease-amylase (PANCREAZE) 37,000-97,300- 149,900 unit capsule; Take one capsule by mouth three times daily. Please use these instructions: Take two capsules WITH each meal, and one capsule with each snack that contains fat or protein. May have three snacks daily.  Dispense: 270 capsule; Refill: 3  - PANCREATIC ELASTASE, STOOL; Future  - CBC; Future  - COMPREHENSIVE METABOLIC PANEL; Future  - C REACTIVE PROTEIN (CRP); Future  - VITAMIN B12; Future  - 25-OH VITAMIN D (D2 + D3); Future  - FOLATE, SERUM; Future  - SED RATE; Future  - CALPROTECTIN, FECAL; Future  - AMB REFERRAL TO DERMATOLOGY  - peg-electrolyte solution (NULYTELY LEMON-LIME) 420 gram oral solution; Split dose by mouth as directed by GI office  Dispense: 4000 mL; Refill: 0    2. Dysphagia, unspecified type  -History of Candida esophagitis per EGD.  Treated twice with 100 mg fluconazole x14 days.  Last treated 08/2020.  Symptoms of dysphagia recurred approximately 2 months ago.  Consider retreating with fluconazole versus EGD with possible dilation and biopsies to rule out recurrent Candida esophagitis.    3. Diarrhea, unspecified type  -Fecal Cal elevated at 324, fecal fat increased to 23, ordered 48-hour stool test, fecal elastase low at 141.  Will prescribe Pancreaze since Creon not covered.  Will obtain 48-hour fecal fat test with high fat diet prior to stool collection.  - lipase-protease-amylase (PANCREAZE) 37,000-97,300- 149,900 unit capsule; Take one capsule by mouth three times daily. Please use these instructions: Take two capsules WITH each meal, and one capsule with each snack that contains fat or protein. May have three snacks daily.  Dispense: 270 capsule; Refill: 3    4. Right lower quadrant abdominal pain  Chronic, persist, history of terminal ileitis, has consulted with colorectal surgery  to discuss terminal ileum resection.  EGD and colonoscopy 12/2020 with evidence of focal active ileitis.  MR E 03/2021 with no active small bowel inflammation and resolved inflammation seen previously in TI.    5. History of esophageal stricture  -History of Candida esophagitis, treated x2 with fluconazole 100 mg daily x14 days    6. Medication management      7. Belching  -Reschedule hydrogen breath test to evaluate for SIBO/IMO    8. Bloating  -Hydrogen breath test    9. Low fecal elastase level  -Start Pancreaze 2 capsules with each meal, and 1 with snacks that can    10. Elevated fecal calprotectin  09/11/2021, elevated at 324, will recheck now.    11. Stool fat increased  23 on 10/04/2021, ordered 48-hour stool collection to confirm results and for better accuracy.    12. Need for pneumococcal vaccination  Immunocompromised, can obtain at PCP, offered appointment in GI for immunization clinic.  14. History of infection due to Candida  -Treated x2, last in March 2022.  Consider retreating versus EGD with possible dilation and biopsies to evaluate for EOE, and recurrence of Candida.    15.  Follow-up with Dr. Janene [name] on 02/19/2022 as scheduled.       A total of 30 minutes were spent today on this encounter.  This includes preparing for the visit, reviewing medical records, obtaining relevant history, speaking to the patient, interpreting test results, ordering medications and tests and documenting the visit in the electronic health record.

## 2021-12-11 ENCOUNTER — Encounter: Admit: 2021-12-11 | Discharge: 2021-12-11 | Payer: Commercial Managed Care - HMO

## 2021-12-12 ENCOUNTER — Ambulatory Visit: Admit: 2021-12-12 | Discharge: 2021-12-12 | Payer: Commercial Managed Care - HMO

## 2021-12-12 DIAGNOSIS — Z8619 Personal history of other infectious and parasitic diseases: Secondary | ICD-10-CM

## 2021-12-12 DIAGNOSIS — K5090 Crohn's disease, unspecified, without complications: Secondary | ICD-10-CM

## 2021-12-12 DIAGNOSIS — R1310 Dysphagia, unspecified: Secondary | ICD-10-CM

## 2021-12-16 ENCOUNTER — Encounter: Admit: 2021-12-16 | Discharge: 2021-12-16 | Payer: Commercial Managed Care - HMO

## 2021-12-16 ENCOUNTER — Ambulatory Visit: Admit: 2021-12-16 | Discharge: 2021-12-16 | Payer: Commercial Managed Care - HMO

## 2021-12-16 DIAGNOSIS — K5000 Crohn's disease of small intestine without complications: Secondary | ICD-10-CM

## 2021-12-16 LAB — CBC
HEMATOCRIT: 47 % (ref 40–50)
HEMOGLOBIN: 15 g/dL (ref 13.5–16.5)
MCH: 29 pg (ref 26–34)
MCHC: 33 g/dL (ref 32.0–36.0)
MCV: 86 FL (ref 80–100)
MPV: 7.2 FL (ref 7–11)
PLATELET COUNT: 256 K/UL (ref 150–400)
RBC COUNT: 5.4 M/UL — ABNORMAL HIGH (ref 4.4–5.5)
RDW: 15 % (ref 11–15)
WBC COUNT: 12 K/UL — ABNORMAL HIGH (ref 4.5–11.0)

## 2021-12-16 LAB — 25-OH VITAMIN D (D2 + D3): VITAMIN D (25-OH) TOTAL: 19 ng/mL — ABNORMAL LOW (ref 30–80)

## 2021-12-16 LAB — COMPREHENSIVE METABOLIC PANEL
ALT: 15 U/L (ref 7–56)
ANION GAP: 9 (ref 3–12)
CO2: 23 MMOL/L (ref 21–30)
EGFR: >60 mL/min (ref >60)
POTASSIUM: 4 MMOL/L (ref 3.5–5.1)
SODIUM: 134 MMOL/L — ABNORMAL LOW (ref 137–147)

## 2021-12-16 LAB — SED RATE: ESR: 17 mm/h — ABNORMAL HIGH (ref 0–15)

## 2021-12-16 LAB — VITAMIN B12: VITAMIN B12: 501 pg/mL (ref 180–914)

## 2021-12-16 LAB — C REACTIVE PROTEIN (CRP): C-REACTIVE PROTEIN: 0.9 mg/dL (ref <1.0)

## 2021-12-16 LAB — FOLATE, SERUM: SERUM FOLATE: >22 ng/mL (ref >3.9)

## 2021-12-18 ENCOUNTER — Encounter: Admit: 2021-12-18 | Discharge: 2021-12-18 | Payer: Commercial Managed Care - HMO

## 2021-12-18 ENCOUNTER — Ambulatory Visit: Admit: 2021-12-18 | Discharge: 2021-12-18 | Payer: Commercial Managed Care - HMO

## 2021-12-18 DIAGNOSIS — R140 Abdominal distension (gaseous): Secondary | ICD-10-CM

## 2021-12-18 DIAGNOSIS — E559 Vitamin D deficiency, unspecified: Secondary | ICD-10-CM

## 2021-12-18 DIAGNOSIS — R1031 Right lower quadrant pain: Secondary | ICD-10-CM

## 2021-12-18 DIAGNOSIS — K5000 Crohn's disease of small intestine without complications: Secondary | ICD-10-CM

## 2021-12-18 MED ORDER — ERGOCALCIFEROL (VITAMIN D2) 1,250 MCG (50,000 UNIT) PO CAP
1 | ORAL_CAPSULE | ORAL | 0 refills | 56.00000 days | Status: AC
Start: 2021-12-18 — End: ?

## 2021-12-18 NOTE — Telephone Encounter
Vit D low- needs 12 weeks Vit D, 50k/week, then recheck levels.   Sed rate slightly elevated- will monitor.   His blood sugar was really high- not sure if he was fasting or not, but should FU with pcp regarding that and his slightly low sodium (134) which is likely hyperglycemia induced hyponatremia. Follow with pcp. Please forward results.

## 2021-12-19 ENCOUNTER — Encounter: Admit: 2021-12-19 | Discharge: 2021-12-19 | Payer: Commercial Managed Care - HMO

## 2021-12-19 DIAGNOSIS — R195 Other fecal abnormalities: Secondary | ICD-10-CM

## 2021-12-19 DIAGNOSIS — Z79899 Other long term (current) drug therapy: Secondary | ICD-10-CM

## 2021-12-19 DIAGNOSIS — K5090 Crohn's disease, unspecified, without complications: Secondary | ICD-10-CM

## 2021-12-22 ENCOUNTER — Encounter: Admit: 2021-12-22 | Discharge: 2021-12-22 | Payer: Commercial Managed Care - HMO

## 2021-12-22 NOTE — Telephone Encounter
Lab orders faxed via right fax to [facility] in [place].    Tel: [phone]  Fax: [phone]

## 2021-12-24 ENCOUNTER — Encounter: Admit: 2021-12-24 | Discharge: 2021-12-24 | Payer: Commercial Managed Care - HMO

## 2022-01-05 ENCOUNTER — Encounter: Admit: 2022-01-05 | Discharge: 2022-01-05 | Payer: Commercial Managed Care - HMO

## 2022-01-05 MED ORDER — FLUCONAZOLE 100 MG PO TAB
100 mg | ORAL_TABLET | Freq: Every day | ORAL | 0 refills | 3.00000 days | Status: AC
Start: 2022-01-05 — End: ?

## 2022-01-05 NOTE — Telephone Encounter
-----   Message from Terese Door sent at 01/02/2022  4:26 PM CDT -----  Regarding: Lab Tests  Contact: [phone]  Yes thats what Im referring to. And yes Im still having issues swallowing

## 2022-01-05 NOTE — Telephone Encounter
Refill request received for fluconazole (DIFLUCAN) 100 mg tablet [[id number]] ENDED   Order Details  Dose: 100 mg Route: Oral Frequency: DAILY   Dispense Quantity: 14 tablet Refills: 0         Sig: Take one tablet by mouth daily for 14 days.        Start Date: 09/12/20 End Date: 09/26/20 after 14 doses   Written Date: 09/12/20 Expiration Date: 09/11/21       Last OV 12/10/2021 with Alan [name]: "Dysphagia, unspecified type  -History of Candida esophagitis per EGD.  Treated twice with 100 mg fluconazole x14 days.  Last treated 08/2020.  Symptoms of dysphagia recurred approximately 2 months ago.  Consider retreating with fluconazole versus EGD with possible dilation and biopsies to rule out recurrent Candida esophagitis."    MD Buckles

## 2022-01-12 ENCOUNTER — Encounter: Admit: 2022-01-12 | Discharge: 2022-01-12 | Payer: Commercial Managed Care - HMO

## 2022-01-12 DIAGNOSIS — K21 Gastroesophageal reflux disease with esophagitis without hemorrhage: Secondary | ICD-10-CM

## 2022-01-12 MED ORDER — PANTOPRAZOLE 40 MG PO TBEC
40 mg | ORAL_TABLET | Freq: Two times a day (BID) | ORAL | 3 refills
Start: 2022-01-12 — End: ?

## 2022-01-15 ENCOUNTER — Encounter: Admit: 2022-01-15 | Discharge: 2022-01-15 | Payer: Commercial Managed Care - HMO

## 2022-01-15 DIAGNOSIS — K5090 Crohn's disease, unspecified, without complications: Secondary | ICD-10-CM

## 2022-01-15 DIAGNOSIS — E559 Vitamin D deficiency, unspecified: Secondary | ICD-10-CM

## 2022-01-15 DIAGNOSIS — R195 Other fecal abnormalities: Secondary | ICD-10-CM

## 2022-01-15 DIAGNOSIS — Z79899 Other long term (current) drug therapy: Secondary | ICD-10-CM

## 2022-01-15 LAB — 25-OH VITAMIN D (D2 + D3)

## 2022-01-28 ENCOUNTER — Encounter: Admit: 2022-01-28 | Discharge: 2022-01-28 | Payer: Commercial Managed Care - HMO

## 2022-02-05 ENCOUNTER — Encounter: Admit: 2022-02-05 | Discharge: 2022-02-05 | Payer: Commercial Managed Care - HMO

## 2022-02-05 DIAGNOSIS — K5090 Crohn's disease, unspecified, without complications: Secondary | ICD-10-CM

## 2022-02-19 ENCOUNTER — Encounter: Admit: 2022-02-19 | Discharge: 2022-02-19 | Payer: Commercial Managed Care - HMO

## 2022-02-19 ENCOUNTER — Ambulatory Visit: Admit: 2022-02-19 | Discharge: 2022-02-19 | Payer: Commercial Managed Care - HMO

## 2022-02-19 ENCOUNTER — Ambulatory Visit: Admit: 2022-02-19 | Discharge: 2022-02-20 | Payer: Commercial Managed Care - HMO

## 2022-02-19 DIAGNOSIS — K219 Gastro-esophageal reflux disease without esophagitis: Secondary | ICD-10-CM

## 2022-02-19 DIAGNOSIS — K5090 Crohn's disease, unspecified, without complications: Secondary | ICD-10-CM

## 2022-02-19 DIAGNOSIS — R1319 Other dysphagia: Secondary | ICD-10-CM

## 2022-02-19 DIAGNOSIS — K5000 Crohn's disease of small intestine without complications: Secondary | ICD-10-CM

## 2022-02-19 DIAGNOSIS — F99 Mental disorder, not otherwise specified: Secondary | ICD-10-CM

## 2022-02-19 DIAGNOSIS — E119 Type 2 diabetes mellitus without complications: Secondary | ICD-10-CM

## 2022-02-19 DIAGNOSIS — E785 Hyperlipidemia, unspecified: Secondary | ICD-10-CM

## 2022-02-19 DIAGNOSIS — D751 Secondary polycythemia: Secondary | ICD-10-CM

## 2022-02-19 DIAGNOSIS — R12 Heartburn: Secondary | ICD-10-CM

## 2022-02-19 DIAGNOSIS — K21 Gastroesophageal reflux disease with esophagitis, unspecified whether hemorrhage: Secondary | ICD-10-CM

## 2022-02-19 DIAGNOSIS — E039 Hypothyroidism, unspecified: Secondary | ICD-10-CM

## 2022-02-19 DIAGNOSIS — K8681 Exocrine pancreatic insufficiency: Secondary | ICD-10-CM

## 2022-02-19 DIAGNOSIS — M797 Fibromyalgia: Secondary | ICD-10-CM

## 2022-02-19 DIAGNOSIS — K582 Mixed irritable bowel syndrome: Secondary | ICD-10-CM

## 2022-02-19 MED ORDER — LACTATED RINGERS IV SOLP
INTRAVENOUS | 0 refills
Start: 2022-02-19 — End: ?

## 2022-02-19 NOTE — Progress Notes
Date of Service: 02/19/2022    Subjective:             Alan [name] is a 41 y.o. male.    History of Present Illness    I had a follow-up appointment today with Alan [name]. He has a history of mild terminal ileal Crohn's disease.  He is currently on Stelara every 4 weeks.  He has been seeing our nurse practitioner most recently.  The patient has been having some recurrent dysphagia.  He was empirically given a course of Diflucan which did not help his symptoms.  He is on pantoprazole 40 mg twice daily for reflux.  He underwent EGD and colonoscopy in July 2022 which showed LA grade a erosive esophagitis.  His GE junction was dilated empirically to 18 mm.  Colonoscopy showed some mild inflammation in the terminal ileum with biopsies showing focal active ileitis.  The rest of the colon was unremarkable.  The patient did see colorectal surgery.  MR enterography October 2022 no longer showed any active small bowel inflammation and was improved relative to prior imaging.  Dr. Daphine Deutscher from colorectal surgery did not feel that surgery would necessarily help the patient with the symptoms.  The nurse practitioner ordered stool studies which showed increased fecal fat of 23% and low pancreatic elastase level of 141.  The patient was started on Pancrease enzymes 37,000 units of lipase 2 with meals.  The patient continues to have alternating diarrhea and constipation.  He still complains about chronic focal right lower quadrant and flank pain.  He has diarrhea after he eats.  Diflucan was not helpful.  Based on low trough level of Stelara, the interval between injections was decreased every 4 weeks.  The patient does not believe this is helped.  He is scheduled for a hydrogen breath test next week.  He is scheduled for follow-up EGD and colonoscopy September 30.  His vitamin D level is mildly low at 19.3.  He is currently on dicyclomine 20 mg twice daily and pantoprazole 40 mg twice daily.  He is on nortriptyline 50 mg at bedtime.  The patient's weight is stable.       Review of Systems   Constitutional: Negative.    HENT: Positive for trouble swallowing.    Eyes: Negative.    Respiratory: Positive for choking.    Cardiovascular: Negative.    Gastrointestinal: Positive for abdominal distention, abdominal pain, constipation and diarrhea.   Endocrine: Negative.    Genitourinary: Negative.    Musculoskeletal: Negative.    Skin: Negative.    Allergic/Immunologic: Negative.    Neurological: Negative.    Hematological: Negative.    Psychiatric/Behavioral: Negative.    A complete review of systems was obtained from the patient and all other systems were reviewed and negative.    Objective:         ? atogepant (QULIPTA) 60 mg tablet Take one tablet by mouth.   ? BD ULTRA-FINE SHORT PEN NEEDLE 31 gauge x 5/16 pen needle USE TO INJECT INSULIN 4 TIMES A DAY   ? cetirizine (ZYRTEC) 10 mg tablet Daily   ? clonazePAM (KLONOPIN) 1 mg tablet TAKE 1 TABLET BY MOUTH TWICE A DAY AS NEEDED FOR ANXIETY   ? cyanocobalamin (VITAMIN B-12) 100 mcg tablet Take 100 mcg by mouth daily.   ? dicyclomine (BENTYL) 20 mg tablet TAKE ONE TABLET BY MOUTH FOUR TIMES DAILY AS NEEDED   ? diphenhydrAMINE (BENADRYL) 25 mg capsule 1 Cap, QID, PO, PRN, Allergy   ?  ERGOcalciferoL (vitamin D2) (VITAMIN D2) 1,250 mcg (50,000 unit) capsule Take one capsule by mouth every 7 days. Indications: vitamin D deficiency (high dose therapy)   ? famotidine (PEPCID) 20 mg tablet Take one tablet by mouth daily.   ? FARXIGA 10 mg tablet Take one tablet by mouth every morning.   ? fluconazole (DIFLUCAN) 100 mg tablet Take one tablet by mouth daily.   ? HUMALOG KWIKPEN INSULIN 100 unit/mL subcutaneous PEN Inject four units under the skin three times daily with meals. Correct for blood sugar over 220. BG 221-260: give 2 units. BG 261-300 give 4 units. BG 301-350: give 6 units. 351-400: 8 units. >400: 10 units   ? hydrOXYzine HCL (ATARAX) 25 mg tablet Take one tablet by mouth every 6 hours as needed. Indications: anxious   ? L-Methylfolate (DEPLIN 15) 15 mg tablet Take one tablet by mouth daily.   ? levothyroxine (SYNTHROID) 25 mcg tablet Take one tablet by mouth daily 30 minutes before breakfast.   ? lipase-protease-amylase (PANCREAZE) 37,000-97,300- 149,900 unit capsule Take one capsule by mouth three times daily. Please use these instructions: Take two capsules WITH each meal, and one capsule with each snack that contains fat or protein. May have three snacks daily.   ? methylprednisolone (MEDROL) 32 mg tablet Take one tablet by mouth as directed. Take 32mg  by mouth 12 hours before appointment, then take 32mg  by mouth 2 hours before appointment time   ? methylprednisolone (MEDROL) 32 mg tablet Take one tablet by mouth as directed. Take 32mg  by mouth 12 hours before appointment, then take 32mg  by mouth 2 hours before appointment time   ? nortriptyline (PAMELOR) 50 mg capsule Take one capsule by mouth at bedtime daily.   ? ONETOUCH DELICA PLUS LANCET 33 gauge USE TO TEST BLOOD SUGAR 4 TIMES PER DAY.   ? OXcarbazepine (TRILEPTAL) 150 mg tablet Take one tablet by mouth twice daily.   ? pantoprazole DR (PROTONIX) 40 mg tablet Take one tablet by mouth twice daily.   ? peg-electrolyte solution (NULYTELY LEMON-LIME) 420 gram oral solution Split dose by mouth as directed by GI office   ? peg3350-sod sul-NaCl-KCl-asb-C (PLENVU) 140-9-5.2 gram ppks Take 1 each by mouth as directed. Split dose   ? prazosin (MINIPRESS) 1 mg capsule    ? rosuvastatin (CRESTOR) 40 mg tablet Take one tablet by mouth daily.   ? STELARA 90 mg/mL syringe INJECT 1 ML UNDER THE SKIN EVERY 28 DAYS FOR CROHNS DISEASE. DOSE INCREASE TO EVERY 4 WEEKS   ? SUMAtriptan succinate (IMITREX) 25 mg tablet    ? traZODone (DESYREL) 50 mg tablet Take one tablet by mouth at bedtime as needed. Indications: insomnia associated with depression   ? TRESIBA FLEXTOUCH U-200 200 unit/mL (3 mL) injection PEN INJECT 50 UNITS SUBCUTANEOUSLY DAILY   ? TRINTELLIX 20 mg tablet Take one tablet by mouth daily.   ? VASCEPA 1 gram capsule TAKE 2 CAPSULES BY MOUTH TWICE A DAY   ? venlafaxine XR (EFFEXOR XR) 75 mg capsule Take one capsule by mouth daily. Take with food.     Vitals:    02/19/22 1101   BP: 128/82   BP Source: Arm, Right Upper   Pulse: 108   PainSc: Five   Weight: 93.9 kg (207 lb)   Height: 177.8 cm (5' 10)     Body mass index is 29.7 kg/m?Marland Kitchen     Physical Exam  Vital signs and nurses notes reviewed  Patient alert and oriented, appropriate  Lungs clear  to auscultation  Heart regular rhythm and rate  Abdomen soft, diffusely tender, normal bowel sounds  Neuro exam without focal deficits       Assessment and Plan:           Tracen Dunkerley was seen today for crohn's disease.    Diagnoses and all orders for this visit:    Crohn's disease involving terminal ileum (HCC)    Chronic RLQ pain  -     AMB REFERRAL TO SPINE CENTER    Irritable bowel syndrome with both constipation and diarrhea    Esophageal dysphagia    Gastroesophageal reflux disease with esophagitis, unspecified whether hemorrhage    Exocrine pancreatic insufficiency    Mr. Markunas has biopsy-proven mild terminal ileitis.  He also has right lower quadrant and more diffuse abdominal pain and loose stools.  The patient symptoms have never responded to any therapy for Crohn's disease.  This includes steroids and Biologics and immunomodulating agents.  He has had documented improvement in the inflammation but his symptoms have always remained the same.  I think the patient has some underlying visceral hypersensitivity and not all of his symptoms are related to GI mucosal inflammation or luminal narrowing.  MR enterography on Stelara showed improvement in terminal ileal inflammation and normal small bowel.  I cannot make the argument that surgical resection of his terminal ileum will necessarily benefit him.  He is also having some dysphagia.  He has been empirically dilated and treated with acid suppressing medications and treated with Diflucan for candidal esophagitis without any improvement in his dysphagia.  He is going to proceed with EGD and colonoscopy as scheduled in September.  We will reassess his esophagitis and terminal ileitis at that time.  If his esophagus is normal, I we will schedule him for esophageal manometry to look for a motility disorder.  I am going to refer the patient to anesthesia pain clinic for trial of nerve blocks or trigger point injections to see if perhaps he has some component of myofascial pain or nerve root impingement that might respond to local blocks.  For now he will continue Stelara at the current dose and pancreatic enzymes and dicyclomine and pantoprazole.  I will schedule him to return to GI clinic.  He is scheduled for a hydrogen breath test next week to look for evidence of SIBO.    1. Proceed with EGD and colonoscopy as scheduled 03/18/22  2. Referral to [facility] - Anesthesia Pain to try injections - they will contact you to schedule an appointment  3. Esophageal manometry ([facility]) - our schedulers will contact you to set this up. If you do not hear from them, please call [phone]. See below regarding prep instructions.  4. Continue Pantoprazole  5. Continue Dicyclomine  6. Continue Pancreatic enzymes  7. Continue Stelara every 4 weeks  8. Proceed with hydrogen breath test     A total of 30 minutes were spent today on this encounter.  This includes preparing for the visit, reviewing medical records, obtaining relevant history, speaking to the patient, interpreting test results, ordering medications and tests and documenting the visit in the electronic health record.

## 2022-02-20 DIAGNOSIS — R1031 Right lower quadrant pain: Secondary | ICD-10-CM

## 2022-02-20 DIAGNOSIS — G8929 Other chronic pain: Secondary | ICD-10-CM

## 2022-02-24 ENCOUNTER — Encounter: Admit: 2022-02-24 | Discharge: 2022-02-24 | Payer: Commercial Managed Care - HMO

## 2022-02-24 DIAGNOSIS — E559 Vitamin D deficiency, unspecified: Secondary | ICD-10-CM

## 2022-02-24 MED ORDER — ERGOCALCIFEROL (VITAMIN D2) 1,250 MCG (50,000 UNIT) PO CAP
ORAL_CAPSULE | 0 refills
Start: 2022-02-24 — End: ?

## 2022-03-03 ENCOUNTER — Encounter: Admit: 2022-03-03 | Discharge: 2022-03-03 | Payer: Commercial Managed Care - HMO

## 2022-03-03 NOTE — Telephone Encounter
LVM informing pt of new pt ppw needing to be completed on my chart or 30 min before appt in person. Informed to bring in any recent imaging on a disc along with reports to appt.

## 2022-03-09 ENCOUNTER — Encounter: Admit: 2022-03-09 | Discharge: 2022-03-09 | Payer: Commercial Managed Care - HMO

## 2022-03-09 DIAGNOSIS — K5000 Crohn's disease of small intestine without complications: Secondary | ICD-10-CM

## 2022-03-09 MED ORDER — PEG-ELECTROLYTE SOLN 420 GRAM PO SOLR
0 refills | Status: AC
Start: 2022-03-09 — End: ?

## 2022-03-12 ENCOUNTER — Encounter: Admit: 2022-03-12 | Discharge: 2022-03-12 | Payer: Commercial Managed Care - HMO

## 2022-03-12 ENCOUNTER — Ambulatory Visit: Admit: 2022-03-12 | Discharge: 2022-03-13 | Payer: Commercial Managed Care - HMO

## 2022-03-12 DIAGNOSIS — M797 Fibromyalgia: Secondary | ICD-10-CM

## 2022-03-12 DIAGNOSIS — F419 Anxiety disorder, unspecified: Secondary | ICD-10-CM

## 2022-03-12 DIAGNOSIS — Z8719 Personal history of other diseases of the digestive system: Secondary | ICD-10-CM

## 2022-03-12 DIAGNOSIS — E119 Type 2 diabetes mellitus without complications: Secondary | ICD-10-CM

## 2022-03-12 DIAGNOSIS — R519 Generalized headaches: Secondary | ICD-10-CM

## 2022-03-12 DIAGNOSIS — F99 Mental disorder, not otherwise specified: Secondary | ICD-10-CM

## 2022-03-12 DIAGNOSIS — D751 Secondary polycythemia: Secondary | ICD-10-CM

## 2022-03-12 DIAGNOSIS — R110 Nausea: Secondary | ICD-10-CM

## 2022-03-12 DIAGNOSIS — F32A Depression: Secondary | ICD-10-CM

## 2022-03-12 DIAGNOSIS — E039 Hypothyroidism, unspecified: Secondary | ICD-10-CM

## 2022-03-12 DIAGNOSIS — K219 Gastro-esophageal reflux disease without esophagitis: Secondary | ICD-10-CM

## 2022-03-12 DIAGNOSIS — I10 Essential (primary) hypertension: Secondary | ICD-10-CM

## 2022-03-12 DIAGNOSIS — M792 Neuralgia and neuritis, unspecified: Secondary | ICD-10-CM

## 2022-03-12 DIAGNOSIS — K5090 Crohn's disease, unspecified, without complications: Secondary | ICD-10-CM

## 2022-03-12 DIAGNOSIS — R109 Unspecified abdominal pain: Secondary | ICD-10-CM

## 2022-03-12 DIAGNOSIS — E785 Hyperlipidemia, unspecified: Secondary | ICD-10-CM

## 2022-03-12 DIAGNOSIS — M2550 Pain in unspecified joint: Secondary | ICD-10-CM

## 2022-03-12 DIAGNOSIS — R12 Heartburn: Secondary | ICD-10-CM

## 2022-03-12 DIAGNOSIS — IMO0002 Ulcer: Secondary | ICD-10-CM

## 2022-03-12 DIAGNOSIS — K5900 Constipation, unspecified: Secondary | ICD-10-CM

## 2022-03-12 DIAGNOSIS — R1084 Generalized abdominal pain: Secondary | ICD-10-CM

## 2022-03-12 NOTE — Progress Notes
Dear Dr. Vinnie Level Buckles,    I appreciate your kind referral of Alan [name] for evaluation of pain.   Please see my note below for the full details of the evaluation and management plan.    Thank you,    Bella Kennedy, M.D.         [facility]      Chief Complaint: Pain  Chief Complaint   Patient presents with   ? Spine - Abdominal pain     Right side    ? New Patient         HPI:   Alan [name] is a 41 y.o. male with a past medical history noted below who presents for evaluation at [facility].    Patient complaints of primarily abdominal pain, which starts at the right flank and then wraps around bilaterally the epigastric area.   He was diagnosed with Chron's Disease 5 years ago.   He was referred to our clinic by gastroenterologist for consideration of nerve block to help with chronic abdominal pain in the setting of Crohn's disease.  Throughout the years, patient has tried multiple medications, had an extensive work-up suggestive of Crohn's disease and has tried numerous nutritious plants without significant relief.  Patient has tried multiple opioid medications and analgesics without significant relief as well.  Patient describes the pain as follows:  -pain started in: 5 years ago  -it is localized to: abdomen RUQ towards the epigastric area and diffuse abdomen  -numbness/tingling: denies  -initial inciting injury or event: denies    -associated symptoms: nausea, constipation, diarrhea  -the pain is described as aching, stabbing, burning  -partially alleviating factors: denies any alleviating factors   -aggravating factors: eating large meals   -VAS: 9/10 when flared up, 6/10 on average  -severity: severe when flare up, moderate when not flared up  -frequency: daily  -loss of bowel or bladder control: denies   -unexplained weight loss: denies        PRIOR MEDICATIONS:   Effective      Ineffective  NSAID  Acetaminophen      Unable to tolerate      Never  Gabapentin   Lyrica  Ami/Nortriptyline  Cymbalta  Tizanidine          PRIOR INTERVENTIONS:   Effective      Ineffective      Denies  Nerve blocks (TAP, celiac, hypogastric)        ROS: Alan [name] denies any recent fevers, chills, infection, nausea, vomit, sick contacts, headaches, changes in vision,   antibiotics, bowel or bladder incontinence, saddle anesthesia, bleeding issues.     A 14 point review of systems was performed and was negative, with the exception of the symptoms described in the HPI.       Past Medical History:  Medical History:   Diagnosis Date   ? Acid reflux    ? Anxiety Jan 2022   ? Constipation All the time   ? Crohn's disease (HCC)    ? Depression Jan 2022   ? Essential hypertension 2019   ? Fibromyalgia    ? Generalized headaches 2019   ? Heartburn    ? Hyperlipidemia    ? Hypothyroid    ? Joint pain March 2018    Im guessing   ? Nausea Multiple times   ? Polycythemia, secondary    ? Psychiatric illness    ? Type II diabetes mellitus (HCC)    ?  Ulcer 2019         Family History:  Family History   Problem Relation Age of Onset   ? None Reported Mother    ? Cancer Father         all males on father side passed of different form of CA   ? Cancer Paternal Grandfather    ? Cancer Paternal Uncle    ? Alcohol liver disease Maternal Grandfather    ? Cancer Maternal Grandfather    ? Cancer Maternal Grandmother          Social History:  [place]ives in [place] [id number]    Social History     Socioeconomic History   ? Marital status: Married   Tobacco Use   ? Smoking status: Every Day     Packs/day: 0.50     Years: 25.00     Additional pack years: 0.00     Total pack years: 12.50     Types: Cigarettes   ? Smokeless tobacco: Former     Types: Snuff, Dorna Bloom     Quit date: 06/29/1997   Vaping Use   ? Vaping Use: Never used   Substance and Sexual Activity   ? Alcohol use: Not Currently     Comment: Havent had a drink in three years   ? Drug use: Never   ? Sexual activity: Yes Partners: Female     Birth control/protection: None         Allergies:  Allergies   Allergen Reactions   ? Azathioprine RASH   ? Chlorhexidine RASH   ? Iodine And Iodide Containing Products RASH   ? Chlorphen-Phenyleph-Hydrocodon ITCHING   ? Gadolinium-Containing Contrast Media FLUSHING (SKIN)     Pt reported has had reaction in past x2 at other location. Pt reports was given benadryl with 2nd reaction.      ? Oxycodone ITCHING         Medications:    Current Outpatient Medications:   ?  atogepant (QULIPTA) 60 mg tablet, Take one tablet by mouth., Disp: , Rfl:   ?  BD ULTRA-FINE SHORT PEN NEEDLE 31 gauge x 5/16 pen needle, USE TO INJECT INSULIN 4 TIMES A DAY, Disp: , Rfl:   ?  cetirizine (ZYRTEC) 10 mg tablet, Daily, Disp: , Rfl:   ?  clonazePAM (KLONOPIN) 1 mg tablet, TAKE 1 TABLET BY MOUTH TWICE A DAY AS NEEDED FOR ANXIETY, Disp: , Rfl:   ?  cyanocobalamin (VITAMIN B-12) 100 mcg tablet, Take 100 mcg by mouth daily., Disp: , Rfl:   ?  dicyclomine (BENTYL) 20 mg tablet, TAKE ONE TABLET BY MOUTH FOUR TIMES DAILY AS NEEDED, Disp: 360 tablet, Rfl: 1  ?  diphenhydrAMINE (BENADRYL) 25 mg capsule, 1 Cap, QID, PO, PRN, Allergy, Disp: , Rfl:   ?  ERGOcalciferoL (vitamin D2) (DRISDOL) 1,250 mcg (50,000 unit) capsule, TAKE 1 CAPSULE BY MOUTH EVERY 7 DAYS FOR VITAMIN D DEFICIENCY, Disp: 12 capsule, Rfl: 0  ?  famotidine (PEPCID) 20 mg tablet, Take one tablet by mouth daily., Disp: , Rfl:   ?  FARXIGA 10 mg tablet, Take one tablet by mouth every morning., Disp: , Rfl:   ?  FETZIMA 40 mg capsule, Take one capsule by mouth daily., Disp: , Rfl:   ?  fluconazole (DIFLUCAN) 100 mg tablet, Take one tablet by mouth daily., Disp: 14 tablet, Rfl: 0  ?  HUMALOG KWIKPEN INSULIN 100 unit/mL subcutaneous PEN, Inject four units under the skin three times daily with  meals. Correct for blood sugar over 220. BG 221-260: give 2 units. BG 261-300 give 4 units. BG 301-350: give 6 units. 351-400: 8 units. >400: 10 units, Disp: 15 mL, Rfl: 0  ? hydrOXYzine HCL (ATARAX) 25 mg tablet, Take one tablet by mouth every 6 hours as needed. Indications: anxious, Disp: 30 tablet, Rfl: 0  ?  L-Methylfolate (DEPLIN 15) 15 mg tablet, Take one tablet by mouth daily., Disp: , Rfl:   ?  levothyroxine (SYNTHROID) 25 mcg tablet, Take one tablet by mouth daily 30 minutes before breakfast., Disp: , Rfl:   ?  lipase-protease-amylase (PANCREAZE) 37,000-97,300- 149,900 unit capsule, Take one capsule by mouth three times daily. Please use these instructions: Take two capsules WITH each meal, and one capsule with each snack that contains fat or protein. May have three snacks daily., Disp: 270 capsule, Rfl: 3  ?  methylprednisolone (MEDROL) 32 mg tablet, Take one tablet by mouth as directed. Take 32mg  by mouth 12 hours before appointment, then take 32mg  by mouth 2 hours before appointment time, Disp: 2 tablet, Rfl: 0  ?  methylprednisolone (MEDROL) 32 mg tablet, Take one tablet by mouth as directed. Take 32mg  by mouth 12 hours before appointment, then take 32mg  by mouth 2 hours before appointment time, Disp: 2 tablet, Rfl: 0  ?  nortriptyline (PAMELOR) 50 mg capsule, Take one capsule by mouth at bedtime daily., Disp: 30 capsule, Rfl: 0  ?  ONETOUCH DELICA PLUS LANCET 33 gauge, USE TO TEST BLOOD SUGAR 4 TIMES PER DAY., Disp: , Rfl:   ?  OXcarbazepine (TRILEPTAL) 150 mg tablet, Take one tablet by mouth twice daily., Disp: , Rfl:   ?  pantoprazole DR (PROTONIX) 40 mg tablet, Take one tablet by mouth twice daily., Disp: 180 tablet, Rfl: 3  ?  peg-electrolyte solution (NULYTELY LEMON-LIME) 420 gram oral solution, Split dose by mouth as directed by GI office, Disp: 4000 mL, Rfl: 0  ?  peg3350-sod sul-NaCl-KCl-asb-C (PLENVU) 140-9-5.2 gram ppks, Take 1 each by mouth as directed. Split dose, Disp: 1 packet, Rfl: 0  ?  prazosin (MINIPRESS) 1 mg capsule, , Disp: , Rfl:   ?  prazosin (MINIPRESS) 2 mg capsule, Take one capsule by mouth at bedtime daily., Disp: , Rfl:   ?  rosuvastatin (CRESTOR) 40 mg tablet, Take one tablet by mouth daily., Disp: , Rfl:   ?  STELARA 90 mg/mL syringe, INJECT 1 ML UNDER THE SKIN EVERY 28 DAYS FOR CROHNS DISEASE. DOSE INCREASE TO EVERY 4 WEEKS, Disp: 1 mL, Rfl: 11  ?  SUMAtriptan succinate (IMITREX) 25 mg tablet, , Disp: , Rfl:   ?  traZODone (DESYREL) 50 mg tablet, Take one tablet by mouth at bedtime as needed. Indications: insomnia associated with depression, Disp: 30 tablet, Rfl: 0  ?  TRESIBA FLEXTOUCH U-200 200 unit/mL (3 mL) injection PEN, INJECT 50 UNITS SUBCUTANEOUSLY DAILY, Disp: , Rfl:   ?  TRINTELLIX 20 mg tablet, Take one tablet by mouth daily., Disp: , Rfl:   ?  VASCEPA 1 gram capsule, TAKE 2 CAPSULES BY MOUTH TWICE A DAY, Disp: , Rfl:   ?  venlafaxine XR (EFFEXOR XR) 75 mg capsule, Take one capsule by mouth daily. Take with food., Disp: 30 capsule, Rfl: 0        Physical examination:   BP (P) 125/84  - Pulse (P) 99  - Ht (P) 175.3 cm (5' 9)  - Wt (P) 93.4 kg (206 lb)  - SpO2 (P) 96%  - BMI (  P) 30.42 kg/m?   Pain Score: Six  Oswestry Total Score:: 58      Gen: No acute distress, cooperative.   HEENT: Normocephalic, atraumatic, hearing grossly intact.   Neck: Supple  Heart: Well perfused   Lungs: No use of accessory muscles of respiration. No labored breathing observed.   Abdomen: No rebound tenderness, no guarding. RUQ pain with deep palpation.   Ext: Purposeful movements  Skin: Dry  Psych: Mood and affect congruent.   Neurological: Alert, oriented, no distress, no facial weakness, conjugate ocular motions, no changes in muscle tone.    Bilateral upper extremity and bilateral lower extremity manual muscle testing with antigravity strength 5/5 throughout.    Sensation grossly intact.  No focal tremors are observed.  Musculoskeletal: Grossly symmetric, functional active range of motion of the lumbar spine.    No significant tenderness to palpation to the paraspinal musculature.  Negative facet loading, negative sitting straight leg raising.        Diagnostic Imaging:  No results found.            Assessment:    Alan [name] is a 41 y.o. male who presents for evaluation. The pain complaints are most likely due to:    1. Intractable generalized abdominal pain  [place] [facility]      2. Chronic abdominal pain        3. Neuropathic pain        4. History of Crohn's disease            Patient complaints of primarily abdominal pain, which starts at the right side and then diffusely twards the epigastric area bilaterally.   He was diagnosed with Chron's Disease 5 years ago. He was referred to our clinic by gastroenterologist for consideration of nerve block to help with chronic abdominal pain in the setting of Crohn's disease.  Throughout the years, patient has tried multiple medications, had an extensive work-up suggestive of Crohn's disease and has tried numerous nutritious plants without significant relief.  The pain has significant impact on the daily quality of life and physical function.         Plan:    Continue with lifestyle modifications, medication regiment.  Based on his symptomatology of chronic abdominal pain will trial right sided intercostal nerve/abdominal NB in clinic under [place].  Future considerations include bilateral celiac plexus block to see if it would offer significant relief given his refractory chronic abdominal pain in the setting of Chron's disease.  Patient is in agreement with the above described plan.      We will get the patient scheduled for a right sided intercostal nerve/TAP block under [place] next available appointment in clinic.   Patient will follow-up after procedure.      Risks/benefits of all pharmacologic and interventional treatments discussed and questions answered.   Thank you for this kind referral for consultation. Please feel free to contact me with any questions or concerns.       Case discussed with attending physician.       Gareth Morgan, MD  Fellow; Interventional Pain Medicine    Department of Anesthesiology and Pain Medicine  [facility]     ATTESTATION    I personally performed the key portions of the E/M visit, discussed case with resident and concur with resident documentation of history, physical exam, assessment, and treatment plan unless otherwise noted.    Staff name:  Bella Kennedy, MD Date: 03/12/2022

## 2022-03-13 DIAGNOSIS — G8929 Other chronic pain: Secondary | ICD-10-CM

## 2022-03-13 DIAGNOSIS — R1031 Right lower quadrant pain: Secondary | ICD-10-CM

## 2022-03-16 ENCOUNTER — Encounter: Admit: 2022-03-16 | Discharge: 2022-03-16 | Payer: Commercial Managed Care - HMO

## 2022-03-16 DIAGNOSIS — R110 Nausea: Secondary | ICD-10-CM

## 2022-03-16 DIAGNOSIS — F32A Depression: Secondary | ICD-10-CM

## 2022-03-16 DIAGNOSIS — E039 Hypothyroidism, unspecified: Secondary | ICD-10-CM

## 2022-03-16 DIAGNOSIS — M2550 Pain in unspecified joint: Secondary | ICD-10-CM

## 2022-03-16 DIAGNOSIS — K219 Gastro-esophageal reflux disease without esophagitis: Secondary | ICD-10-CM

## 2022-03-16 DIAGNOSIS — R12 Heartburn: Secondary | ICD-10-CM

## 2022-03-16 DIAGNOSIS — I10 Essential (primary) hypertension: Secondary | ICD-10-CM

## 2022-03-16 DIAGNOSIS — IMO0002 Ulcer: Secondary | ICD-10-CM

## 2022-03-16 DIAGNOSIS — E119 Type 2 diabetes mellitus without complications: Secondary | ICD-10-CM

## 2022-03-16 DIAGNOSIS — M797 Fibromyalgia: Secondary | ICD-10-CM

## 2022-03-16 DIAGNOSIS — F99 Mental disorder, not otherwise specified: Secondary | ICD-10-CM

## 2022-03-16 DIAGNOSIS — E785 Hyperlipidemia, unspecified: Secondary | ICD-10-CM

## 2022-03-16 DIAGNOSIS — F419 Anxiety disorder, unspecified: Secondary | ICD-10-CM

## 2022-03-16 DIAGNOSIS — K5900 Constipation, unspecified: Secondary | ICD-10-CM

## 2022-03-16 DIAGNOSIS — K5090 Crohn's disease, unspecified, without complications: Secondary | ICD-10-CM

## 2022-03-16 DIAGNOSIS — D751 Secondary polycythemia: Secondary | ICD-10-CM

## 2022-03-16 DIAGNOSIS — R519 Generalized headaches: Secondary | ICD-10-CM

## 2022-03-16 MED ADMIN — LIDOCAINE HCL 2 % MM SOLN [81336]: 15 mL | ORAL | @ 20:00:00 | Stop: 2022-03-16 | NDC 50383077504

## 2022-03-16 MED ADMIN — LIDOCAINE HCL 2 % MM JELL [4448]: 3 mL | TOPICAL | @ 20:00:00 | Stop: 2022-03-16 | NDC 17478071110

## 2022-03-17 ENCOUNTER — Encounter: Admit: 2022-03-17 | Discharge: 2022-03-17 | Payer: Commercial Managed Care - HMO

## 2022-03-17 DIAGNOSIS — E785 Hyperlipidemia, unspecified: Secondary | ICD-10-CM

## 2022-03-17 DIAGNOSIS — M797 Fibromyalgia: Secondary | ICD-10-CM

## 2022-03-17 DIAGNOSIS — I10 Essential (primary) hypertension: Secondary | ICD-10-CM

## 2022-03-17 DIAGNOSIS — M2550 Pain in unspecified joint: Secondary | ICD-10-CM

## 2022-03-17 DIAGNOSIS — IMO0002 Ulcer: Secondary | ICD-10-CM

## 2022-03-17 DIAGNOSIS — K6389 Other specified diseases of intestine: Secondary | ICD-10-CM

## 2022-03-17 DIAGNOSIS — K5900 Constipation, unspecified: Secondary | ICD-10-CM

## 2022-03-17 DIAGNOSIS — E039 Hypothyroidism, unspecified: Secondary | ICD-10-CM

## 2022-03-17 DIAGNOSIS — R519 Generalized headaches: Secondary | ICD-10-CM

## 2022-03-17 DIAGNOSIS — F419 Anxiety disorder, unspecified: Secondary | ICD-10-CM

## 2022-03-17 DIAGNOSIS — D751 Secondary polycythemia: Secondary | ICD-10-CM

## 2022-03-17 DIAGNOSIS — R12 Heartburn: Secondary | ICD-10-CM

## 2022-03-17 DIAGNOSIS — F32A Depression: Secondary | ICD-10-CM

## 2022-03-17 DIAGNOSIS — F99 Mental disorder, not otherwise specified: Secondary | ICD-10-CM

## 2022-03-17 DIAGNOSIS — K219 Gastro-esophageal reflux disease without esophagitis: Secondary | ICD-10-CM

## 2022-03-17 DIAGNOSIS — E119 Type 2 diabetes mellitus without complications: Secondary | ICD-10-CM

## 2022-03-17 DIAGNOSIS — K5090 Crohn's disease, unspecified, without complications: Secondary | ICD-10-CM

## 2022-03-17 DIAGNOSIS — R110 Nausea: Secondary | ICD-10-CM

## 2022-03-17 MED ORDER — METRONIDAZOLE 500 MG PO TAB
500 mg | ORAL_TABLET | Freq: Two times a day (BID) | ORAL | 0 refills | Status: AC
Start: 2022-03-17 — End: ?

## 2022-03-18 ENCOUNTER — Encounter: Admit: 2022-03-18 | Discharge: 2022-03-18 | Payer: Commercial Managed Care - HMO

## 2022-03-18 ENCOUNTER — Ambulatory Visit: Admit: 2022-03-18 | Discharge: 2022-03-18 | Payer: Commercial Managed Care - HMO

## 2022-03-18 DIAGNOSIS — M2550 Pain in unspecified joint: Secondary | ICD-10-CM

## 2022-03-18 DIAGNOSIS — E039 Hypothyroidism, unspecified: Secondary | ICD-10-CM

## 2022-03-18 DIAGNOSIS — F419 Anxiety disorder, unspecified: Secondary | ICD-10-CM

## 2022-03-18 DIAGNOSIS — I10 Essential (primary) hypertension: Secondary | ICD-10-CM

## 2022-03-18 DIAGNOSIS — R110 Nausea: Secondary | ICD-10-CM

## 2022-03-18 DIAGNOSIS — K5900 Constipation, unspecified: Secondary | ICD-10-CM

## 2022-03-18 DIAGNOSIS — E119 Type 2 diabetes mellitus without complications: Secondary | ICD-10-CM

## 2022-03-18 DIAGNOSIS — R519 Generalized headaches: Secondary | ICD-10-CM

## 2022-03-18 DIAGNOSIS — K219 Gastro-esophageal reflux disease without esophagitis: Secondary | ICD-10-CM

## 2022-03-18 DIAGNOSIS — M797 Fibromyalgia: Secondary | ICD-10-CM

## 2022-03-18 DIAGNOSIS — E785 Hyperlipidemia, unspecified: Secondary | ICD-10-CM

## 2022-03-18 DIAGNOSIS — R12 Heartburn: Secondary | ICD-10-CM

## 2022-03-18 DIAGNOSIS — K5090 Crohn's disease, unspecified, without complications: Secondary | ICD-10-CM

## 2022-03-18 DIAGNOSIS — F32A Depression: Secondary | ICD-10-CM

## 2022-03-18 DIAGNOSIS — IMO0002 Ulcer: Secondary | ICD-10-CM

## 2022-03-18 DIAGNOSIS — D751 Secondary polycythemia: Secondary | ICD-10-CM

## 2022-03-18 DIAGNOSIS — F99 Mental disorder, not otherwise specified: Secondary | ICD-10-CM

## 2022-03-18 MED ORDER — PROPOFOL 10 MG/ML IV EMUL 20 ML (INFUSION)(AM)(OR)
INTRAVENOUS | 0 refills | Status: DC
Start: 2022-03-18 — End: 2022-03-18
  Administered 2022-03-18: 14:00:00 150 ug/kg/min via INTRAVENOUS

## 2022-03-18 MED ORDER — PROPOFOL INJ 10 MG/ML IV VIAL
INTRAVENOUS | 0 refills | Status: DC
Start: 2022-03-18 — End: 2022-03-18
  Administered 2022-03-18: 14:00:00 150 mg via INTRAVENOUS

## 2022-03-18 MED ORDER — LACTATED RINGERS IV SOLP
INTRAVENOUS | 0 refills | Status: DC
Start: 2022-03-18 — End: 2022-03-18
  Administered 2022-03-18: 14:00:00 via INTRAVENOUS

## 2022-03-18 MED ORDER — LIDOCAINE (PF) 20 MG/ML (2 %) IJ SOLN
INTRAVENOUS | 0 refills | Status: DC
Start: 2022-03-18 — End: 2022-03-18
  Administered 2022-03-18: 14:00:00 60 mg via INTRAVENOUS

## 2022-03-18 MED ADMIN — ACETAMINOPHEN 500 MG PO TAB [102]: 1000 mg | ORAL | @ 15:00:00 | Stop: 2022-03-18 | NDC 00904672080

## 2022-03-18 NOTE — Anesthesia Post-Procedure Evaluation
Post-Anesthesia Evaluation    Name: Alan [name]      MRN: [id number]     DOB: May 19, 1981     Age: 41 y.o.     Sex: male   __________________________________________________________________________     Procedure Information     Anesthesia Start Date/Time: 03/18/22 0917    Procedures:       ESOPHAGOGASTRODUODENOSCOPY WITH DILATION GASTRIC/ DUODENAL STRICTURE - FLEXIBLE      COLONOSCOPY DIAGNOSTIC WITH SPECIMEN COLLECTION BY BRUSHING/ WASHING - FLEXIBLE      ESOPHAGOGASTRODUODENOSCOPY WITH BIOPSY - FLEXIBLE      COLONOSCOPY WITH BIOPSY - FLEXIBLE    Location: ENDO 4 / ENDO/GI    Surgeons: Buckles, Darnelle Maffucci, MD          Post-Anesthesia Vitals  BP: 119/84 (09/20 1005)  Temp: 36.3 C (97.4 F) (09/20 0955)  Pulse: 77 (09/20 1015)  Respirations: 20 PER MINUTE (09/20 1015)  SpO2: 97 % (09/20 1015)  SpO2 Pulse: 79 (09/20 1015)  O2 Device: None (Room air) (09/20 0955)   Vitals Value Taken Time   BP 119/84 03/18/22 1005   Temp 36.3 C (97.4 F) 03/18/22 0955   Pulse 77 03/18/22 1015   Respirations 20 PER MINUTE 03/18/22 1015   SpO2 97 % 03/18/22 1015   O2 Device None (Room air) 03/18/22 0955   ABP     ART BP           Post Anesthesia Evaluation Note    Evaluation location: Pre/Post  Patient participation: recovered; patient participated in evaluation  Level of consciousness: alert  Pain management: adequate    Hydration: normovolemia  Temperature: 36.0C - 38.4C  Airway patency: adequate    Perioperative Events       Post-op nausea and vomiting: no PONV    Postoperative Status  Cardiovascular status: hemodynamically stable  Respiratory status: spontaneous ventilation  Follow-up needed: none        Perioperative Events  There were no known notable events for this encounter.

## 2022-03-18 NOTE — Anesthesia Pre-Procedure Evaluation
Anesthesia Pre-Procedure Evaluation    Name: Alan [name]      MRN: [id number]     DOB: 1980-12-13     Age: 41 y.o.     Sex: male   _________________________________________________________________________     Procedure Info:   Procedure Information     Date/Time: 03/18/22 0925    Procedures:       ESOPHAGOGASTRODUODENOSCOPY WITH DILATION GASTRIC/ DUODENAL STRICTURE - FLEXIBLE      COLONOSCOPY DIAGNOSTIC WITH SPECIMEN COLLECTION BY BRUSHING/ WASHING - FLEXIBLE    Location: ENDO 4 / ENDO/GI    Surgeons: Buckles, Vinnie Level, MD          Physical Assessment  Vital Signs (last filed in past 24 hours):         Patient History   Allergies   Allergen Reactions   ? Azathioprine RASH   ? Chlorhexidine RASH   ? Iodine And Iodide Containing Products RASH   ? Chlorphen-Phenyleph-Hydrocodon ITCHING   ? Gadolinium-Containing Contrast Media FLUSHING (SKIN)     Pt reported has had reaction in past x2 at other location. Pt reports was given benadryl with 2nd reaction.      ? Oxycodone ITCHING        Current Medications    Medication Directions   atogepant (QULIPTA) 60 mg tablet Take one tablet by mouth.   BD ULTRA-FINE SHORT PEN NEEDLE 31 gauge x 5/16 pen needle USE TO INJECT INSULIN 4 TIMES A DAY   cetirizine (ZYRTEC) 10 mg tablet Daily   clonazePAM (KLONOPIN) 1 mg tablet TAKE 1 TABLET BY MOUTH TWICE A DAY AS NEEDED FOR ANXIETY   cyanocobalamin (VITAMIN B-12) 100 mcg tablet Take 100 mcg by mouth daily.   dicyclomine (BENTYL) 20 mg tablet TAKE ONE TABLET BY MOUTH FOUR TIMES DAILY AS NEEDED   diphenhydrAMINE (BENADRYL) 25 mg capsule 1 Cap, QID, PO, PRN, Allergy   ERGOcalciferoL (vitamin D2) (DRISDOL) 1,250 mcg (50,000 unit) capsule TAKE 1 CAPSULE BY MOUTH EVERY 7 DAYS FOR VITAMIN D DEFICIENCY   famotidine (PEPCID) 20 mg tablet Take one tablet by mouth daily.   FARXIGA 10 mg tablet Take one tablet by mouth every morning.   FETZIMA 40 mg capsule Take one capsule by mouth daily.   fluconazole (DIFLUCAN) 100 mg tablet Take one tablet by mouth daily.   HUMALOG KWIKPEN INSULIN 100 unit/mL subcutaneous PEN Inject four units under the skin three times daily with meals. Correct for blood sugar over 220. BG 221-260: give 2 units. BG 261-300 give 4 units. BG 301-350: give 6 units. 351-400: 8 units. >400: 10 units   hydrOXYzine HCL (ATARAX) 25 mg tablet Take one tablet by mouth every 6 hours as needed. Indications: anxious   L-Methylfolate (DEPLIN 15) 15 mg tablet Take one tablet by mouth daily.   levothyroxine (SYNTHROID) 25 mcg tablet Take one tablet by mouth daily 30 minutes before breakfast.   lipase-protease-amylase (PANCREAZE) 37,000-97,300- 149,900 unit capsule Take one capsule by mouth three times daily. Please use these instructions: Take two capsules WITH each meal, and one capsule with each snack that contains fat or protein. May have three snacks daily.   methylprednisolone (MEDROL) 32 mg tablet Take one tablet by mouth as directed. Take 32mg  by mouth 12 hours before appointment, then take 32mg  by mouth 2 hours before appointment time   methylprednisolone (MEDROL) 32 mg tablet Take one tablet by mouth as directed. Take 32mg  by mouth 12 hours before appointment, then take 32mg  by mouth 2 hours before appointment time  metroNIDAZOLE (FLAGYL) 500 mg tablet Take one tablet by mouth twice daily for 10 days. Take with food. Do not drink alcohol while on metronidazole.   nortriptyline (PAMELOR) 50 mg capsule Take one capsule by mouth at bedtime daily.   ONETOUCH DELICA PLUS LANCET 33 gauge USE TO TEST BLOOD SUGAR 4 TIMES PER DAY.   OXcarbazepine (TRILEPTAL) 150 mg tablet Take one tablet by mouth twice daily.   pantoprazole DR (PROTONIX) 40 mg tablet Take one tablet by mouth twice daily.   peg-electrolyte solution (NULYTELY LEMON-LIME) 420 gram oral solution Split dose by mouth as directed by GI office   peg3350-sod sul-NaCl-KCl-asb-C (PLENVU) 140-9-5.2 gram ppks Take 1 each by mouth as directed. Split dose   prazosin (MINIPRESS) 1 mg capsule prazosin (MINIPRESS) 2 mg capsule Take one capsule by mouth at bedtime daily.   rosuvastatin (CRESTOR) 40 mg tablet Take one tablet by mouth daily.   STELARA 90 mg/mL syringe INJECT 1 ML UNDER THE SKIN EVERY 28 DAYS FOR CROHNS DISEASE. DOSE INCREASE TO EVERY 4 WEEKS   SUMAtriptan succinate (IMITREX) 25 mg tablet    traZODone (DESYREL) 50 mg tablet Take one tablet by mouth at bedtime as needed. Indications: insomnia associated with depression   TRESIBA FLEXTOUCH U-200 200 unit/mL (3 mL) injection PEN INJECT 50 UNITS SUBCUTANEOUSLY DAILY   TRINTELLIX 20 mg tablet Take one tablet by mouth daily.   VASCEPA 1 gram capsule TAKE 2 CAPSULES BY MOUTH TWICE A DAY   venlafaxine XR (EFFEXOR XR) 75 mg capsule Take one capsule by mouth daily. Take with food.         Review of Systems/Medical History      Patient summary reviewed  Nursing notes reviewed  Pertinent labs reviewed    PONV Screening: Non-smoker and Postoperative opioids  No history of anesthetic complications  No family history of anesthetic complications      Airway - negative        Pulmonary - negative          Cardiovascular         Exercise tolerance: >4 METS      Beta Blocker therapy: No      Beta blockers within 24 hours: n/a                  Hyperlipidemia      GI/Hepatic/Renal       Inflammatory bowel disease        GERD,         Bowel prep        Crohn's Disease      Neuro/Psych - negative        Musculoskeletal - negative          Endocrine/Other       Diabetes, type 2; using insulin        Hypothyroidism        Obesity      Constitution - negative       Physical Exam    Airway Findings      Mallampati: II      TM distance: >3 FB      Neck ROM: full      Mouth opening: good      Airway patency: adequate    Dental Findings: Negative      Cardiovascular Findings: Negative      Rhythm: regular      Rate: normal    Pulmonary Findings: Negative      Breath sounds clear to auscultation.  Abdominal Findings: Negative        Abdomen soft      Bowel sounds normal.    Neurological Findings: Negative      Alert and oriented x 3    Constitutional findings: Negative      No acute distress      Well-developed      Well-nourished       Diagnostic Tests  Hematology:   Lab Results   Component Value Date    HGB 15.9 12/16/2021    HCT 47.1 12/16/2021    PLTCT 256 12/16/2021    WBC 12.2 12/16/2021    NEUT 62 06/03/2021    ANC 7.50 06/03/2021    ALC 2.93 06/03/2021    MONA 9 06/03/2021    AMC 1.15 06/03/2021    EOSA 4 06/03/2021    ABC 0.09 06/03/2021    MCV 86.1 12/16/2021    MCH 29.1 12/16/2021    MCHC 33.8 12/16/2021    MPV 7.2 12/16/2021    RDW 15.0 12/16/2021         General Chemistry:   Lab Results   Component Value Date    NA 134 12/16/2021    K 4.0 12/16/2021    CL 102 12/16/2021    CO2 23 12/16/2021    GAP 9 12/16/2021    BUN 14 12/16/2021    CR 0.81 12/16/2021    GLU 274 12/16/2021    CA 9.1 12/16/2021    ALBUMIN 4.1 12/16/2021    MG 1.9 12/17/2020    TOTBILI 0.3 12/16/2021      Coagulation:   Lab Results   Component Value Date    PT 10.5 12/17/2020    PTT 31.4 12/17/2020    INR 0.9 12/17/2020         Anesthesia Plan    ASA score: 2   Plan: MAC  Induction method: intravenous  NPO status: acceptable      Informed Consent  Anesthetic plan and risks discussed with patient.        Plan discussed with: anesthesiologist, CRNA and surgeon/proceduralist.    [facility] Plan      Alerts

## 2022-03-19 ENCOUNTER — Encounter: Admit: 2022-03-19 | Discharge: 2022-03-19 | Payer: Commercial Managed Care - HMO

## 2022-03-19 DIAGNOSIS — M2550 Pain in unspecified joint: Secondary | ICD-10-CM

## 2022-03-19 DIAGNOSIS — K5090 Crohn's disease, unspecified, without complications: Secondary | ICD-10-CM

## 2022-03-19 DIAGNOSIS — E785 Hyperlipidemia, unspecified: Secondary | ICD-10-CM

## 2022-03-19 DIAGNOSIS — R110 Nausea: Secondary | ICD-10-CM

## 2022-03-19 DIAGNOSIS — M797 Fibromyalgia: Secondary | ICD-10-CM

## 2022-03-19 DIAGNOSIS — R519 Generalized headaches: Secondary | ICD-10-CM

## 2022-03-19 DIAGNOSIS — E119 Type 2 diabetes mellitus without complications: Secondary | ICD-10-CM

## 2022-03-19 DIAGNOSIS — D751 Secondary polycythemia: Secondary | ICD-10-CM

## 2022-03-19 DIAGNOSIS — I10 Essential (primary) hypertension: Secondary | ICD-10-CM

## 2022-03-19 DIAGNOSIS — F99 Mental disorder, not otherwise specified: Secondary | ICD-10-CM

## 2022-03-19 DIAGNOSIS — IMO0002 Ulcer: Secondary | ICD-10-CM

## 2022-03-19 DIAGNOSIS — K219 Gastro-esophageal reflux disease without esophagitis: Secondary | ICD-10-CM

## 2022-03-19 DIAGNOSIS — F32A Depression: Secondary | ICD-10-CM

## 2022-03-19 DIAGNOSIS — R12 Heartburn: Secondary | ICD-10-CM

## 2022-03-19 DIAGNOSIS — E039 Hypothyroidism, unspecified: Secondary | ICD-10-CM

## 2022-03-19 DIAGNOSIS — F419 Anxiety disorder, unspecified: Secondary | ICD-10-CM

## 2022-03-19 DIAGNOSIS — K5900 Constipation, unspecified: Secondary | ICD-10-CM

## 2022-04-09 ENCOUNTER — Encounter: Admit: 2022-04-09 | Discharge: 2022-04-09 | Payer: Commercial Managed Care - HMO

## 2022-04-09 ENCOUNTER — Ambulatory Visit: Admit: 2022-04-09 | Discharge: 2022-04-09 | Payer: Commercial Managed Care - HMO

## 2022-04-09 DIAGNOSIS — F419 Anxiety disorder, unspecified: Secondary | ICD-10-CM

## 2022-04-09 DIAGNOSIS — M792 Neuralgia and neuritis, unspecified: Secondary | ICD-10-CM

## 2022-04-09 DIAGNOSIS — D751 Secondary polycythemia: Secondary | ICD-10-CM

## 2022-04-09 DIAGNOSIS — M797 Fibromyalgia: Secondary | ICD-10-CM

## 2022-04-09 DIAGNOSIS — IMO0002 Ulcer: Secondary | ICD-10-CM

## 2022-04-09 DIAGNOSIS — K5900 Constipation, unspecified: Secondary | ICD-10-CM

## 2022-04-09 DIAGNOSIS — R110 Nausea: Secondary | ICD-10-CM

## 2022-04-09 DIAGNOSIS — R12 Heartburn: Secondary | ICD-10-CM

## 2022-04-09 DIAGNOSIS — I10 Essential (primary) hypertension: Secondary | ICD-10-CM

## 2022-04-09 DIAGNOSIS — E119 Type 2 diabetes mellitus without complications: Secondary | ICD-10-CM

## 2022-04-09 DIAGNOSIS — F99 Mental disorder, not otherwise specified: Secondary | ICD-10-CM

## 2022-04-09 DIAGNOSIS — E039 Hypothyroidism, unspecified: Secondary | ICD-10-CM

## 2022-04-09 DIAGNOSIS — R1084 Generalized abdominal pain: Secondary | ICD-10-CM

## 2022-04-09 DIAGNOSIS — G588 Other specified mononeuropathies: Secondary | ICD-10-CM

## 2022-04-09 DIAGNOSIS — K219 Gastro-esophageal reflux disease without esophagitis: Secondary | ICD-10-CM

## 2022-04-09 DIAGNOSIS — K5090 Crohn's disease, unspecified, without complications: Secondary | ICD-10-CM

## 2022-04-09 DIAGNOSIS — F32A Depression: Secondary | ICD-10-CM

## 2022-04-09 DIAGNOSIS — E785 Hyperlipidemia, unspecified: Secondary | ICD-10-CM

## 2022-04-09 DIAGNOSIS — R519 Generalized headaches: Secondary | ICD-10-CM

## 2022-04-09 DIAGNOSIS — M2550 Pain in unspecified joint: Secondary | ICD-10-CM

## 2022-04-09 MED ORDER — BUPIVACAINE (PF) 0.5 % (5 MG/ML) IJ SOLN
10 mL | Freq: Once | INTRAMUSCULAR | 0 refills | Status: CP | PRN
Start: 2022-04-09 — End: ?

## 2022-04-09 MED ORDER — METHYLPREDNISOLONE ACETATE 40 MG/ML IJ SUSP
40 mg | Freq: Once | INTRAMUSCULAR | 0 refills | Status: CP | PRN
Start: 2022-04-09 — End: ?

## 2022-04-09 NOTE — Procedures
Attending Surgeon: Bella Kennedy, MD    Anesthesia: Local    Pre-Procedure Diagnosis:   1. Neuralgia involving abdomen    2. Intractable generalized abdominal pain    3. Intercostal neuralgia        Post-Procedure Diagnosis:   1. Neuralgia involving abdomen    2. Intractable generalized abdominal pain    3. Intercostal neuralgia        Pain Score: Six    [place] [facility]  Nerve: Intercostal,  # of Intercostal Levels: 3  Laterality: right   on 04/09/2022 11:30 AM    Consent:   Consent obtained: written and verbal  Consent given by: patient  Alternatives discussed: alternative treatment and delayed treatment  Discussed with patient the purpose of the treatment/procedure, other ways of treating my condition, including no treatment/ procedure and the risks and benefits of the alternatives. Patient has decided to proceed with treatment/procedure.        Universal Protocol:  Relevant documents: relevant documents present and verified  Test results: test results available and properly labeled  Imaging studies: imaging studies available  Required items: required blood products, implants, devices, and special equipment available  Site marked: the operative site was marked  Patient identity confirmed: Patient identify confirmed verbally with patient.        Time out: Immediately prior to procedure a time out was called to verify the correct patient, procedure, equipment, support staff and site/side marked as required        Procedures Details:   Indications: Neuritis and Pain Relief  Preparation: Patient was prepped and draped in the usual sterile fashion.  Prep: 2% chlorhexidine  Patient position: supine  Needle size: 25 G  Guidance: ultrasound  Justification for use of ultrasound guidance: The use of direct sonographic visualization of the needle (rather than a non-guided injection) was required to ensure accurate injection placement for diagnostic specificity, to maximize clinical efficacy and for safety purposes to minimize risk of bleeding or injury to nearby neurovascular structures. (nerve loclaization)  Medication Administered - 10 mL bupivacaine PF 0.5 %; 40 mg methylPREDNISolone ACETATE 40 mg/mL  Outcome: Pain improved  Patient tolerance: tolerated well, no immediate complications  Comments: Injected right T8, T9, T10 nerves with 40mg  of depomedrol and 10ml of 0.5%bupivicaine             Estimated blood loss: none or minimal  Specimens: none  Patient tolerated the procedure well with no immediate complications. Pressure was applied, and hemostasis was accomplished.

## 2022-04-15 ENCOUNTER — Encounter: Admit: 2022-04-15 | Discharge: 2022-04-15 | Payer: Commercial Managed Care - HMO

## 2022-04-16 ENCOUNTER — Encounter: Admit: 2022-04-16 | Discharge: 2022-04-16 | Payer: Commercial Managed Care - HMO

## 2022-04-16 DIAGNOSIS — R197 Diarrhea, unspecified: Secondary | ICD-10-CM

## 2022-04-16 NOTE — Progress Notes
Buckles, Darnelle Maffucci, MD  You 22 minutes ago (2:10 PM)       I would like to check stool for c diff and culture   If patient is concerned about dehydration, he might need to come to ED

## 2022-04-20 ENCOUNTER — Encounter: Admit: 2022-04-20 | Discharge: 2022-04-20 | Payer: Commercial Managed Care - HMO

## 2022-04-21 ENCOUNTER — Encounter: Admit: 2022-04-21 | Discharge: 2022-04-21 | Payer: Commercial Managed Care - HMO

## 2022-04-21 DIAGNOSIS — R197 Diarrhea, unspecified: Secondary | ICD-10-CM

## 2022-04-21 MED ORDER — DIPHENOXYLATE-ATROPINE 2.5-0.025 MG PO TAB
1 | ORAL_TABLET | Freq: Four times a day (QID) | ORAL | 3 refills | 15.00000 days | Status: AC | PRN
Start: 2022-04-21 — End: ?

## 2022-04-24 ENCOUNTER — Encounter: Admit: 2022-04-24 | Discharge: 2022-04-24 | Payer: Commercial Managed Care - HMO

## 2022-04-24 DIAGNOSIS — R197 Diarrhea, unspecified: Secondary | ICD-10-CM

## 2022-04-27 ENCOUNTER — Encounter: Admit: 2022-04-27 | Discharge: 2022-04-27 | Payer: Commercial Managed Care - HMO

## 2022-04-27 DIAGNOSIS — K5000 Crohn's disease of small intestine without complications: Secondary | ICD-10-CM

## 2022-04-27 MED ORDER — PREDNISONE 10 MG PO TAB
ORAL_TABLET | ORAL | 0 refills | Status: AC
Start: 2022-04-27 — End: ?

## 2022-04-30 ENCOUNTER — Encounter: Admit: 2022-04-30 | Discharge: 2022-04-30 | Payer: Commercial Managed Care - HMO

## 2022-05-04 ENCOUNTER — Encounter: Admit: 2022-05-04 | Discharge: 2022-05-04 | Payer: Commercial Managed Care - HMO

## 2022-05-04 DIAGNOSIS — K5000 Crohn's disease of small intestine without complications: Secondary | ICD-10-CM

## 2022-05-04 MED ORDER — CHLORDIAZEPOXIDE-CLIDINIUM 5-2.5 MG PO CAP
1 | ORAL_CAPSULE | Freq: Three times a day (TID) | ORAL | 3 refills | Status: AC
Start: 2022-05-04 — End: ?

## 2022-05-06 ENCOUNTER — Encounter: Admit: 2022-05-06 | Discharge: 2022-05-06 | Payer: Commercial Managed Care - HMO

## 2022-05-07 ENCOUNTER — Encounter: Admit: 2022-05-07 | Discharge: 2022-05-07 | Payer: Commercial Managed Care - HMO

## 2022-05-07 MED ORDER — DICYCLOMINE 20 MG PO TAB
20 mg | ORAL_TABLET | Freq: Four times a day (QID) | ORAL | 1 refills | Status: AC | PRN
Start: 2022-05-07 — End: ?

## 2022-05-07 NOTE — Telephone Encounter
Refill request received for:    dicyclomine (BENTYL) 20 mg tablet     Sig: TAKE ONE TABLET BY MOUTH FOUR TIMES DAILY AS NEEDED       Last OV: 02/19/22  Last fill: 03/05/22    Routing to Dr. Janene Madeira for approval/refusal

## 2022-05-08 ENCOUNTER — Encounter: Admit: 2022-05-08 | Discharge: 2022-05-08 | Payer: Commercial Managed Care - HMO

## 2022-05-08 DIAGNOSIS — K654 Sclerosing mesenteritis: Secondary | ICD-10-CM

## 2022-05-08 MED ORDER — SODIUM CHLORIDE 0.9 % IJ SOLN
50 mL | Freq: Once | INTRAVENOUS | 0 refills | Status: CP
Start: 2022-05-08 — End: ?
  Administered 2022-05-08: 17:00:00 50 mL via INTRAVENOUS

## 2022-05-08 MED ORDER — IOHEXOL 350 MG IODINE/ML IV SOLN
100 mL | Freq: Once | INTRAVENOUS | 0 refills | Status: CP
Start: 2022-05-08 — End: ?
  Administered 2022-05-08: 17:00:00 100 mL via INTRAVENOUS

## 2022-05-08 MED ORDER — METRONIDAZOLE 500 MG PO TAB
500 mg | ORAL_TABLET | Freq: Two times a day (BID) | ORAL | 0 refills | Status: AC
Start: 2022-05-08 — End: ?

## 2022-05-08 MED ORDER — CIPROFLOXACIN HCL 250 MG PO TAB
250 mg | ORAL_TABLET | Freq: Two times a day (BID) | ORAL | 0 refills | 10.00000 days | Status: AC
Start: 2022-05-08 — End: ?

## 2022-05-18 ENCOUNTER — Encounter: Admit: 2022-05-18 | Discharge: 2022-05-18 | Payer: Commercial Managed Care - HMO

## 2022-05-18 ENCOUNTER — Ambulatory Visit: Admit: 2022-05-18 | Discharge: 2022-05-18 | Payer: Commercial Managed Care - HMO

## 2022-05-18 DIAGNOSIS — R519 Generalized headaches: Secondary | ICD-10-CM

## 2022-05-18 DIAGNOSIS — I10 Essential (primary) hypertension: Secondary | ICD-10-CM

## 2022-05-18 DIAGNOSIS — K5900 Constipation, unspecified: Secondary | ICD-10-CM

## 2022-05-18 DIAGNOSIS — F99 Mental disorder, not otherwise specified: Secondary | ICD-10-CM

## 2022-05-18 DIAGNOSIS — R197 Diarrhea, unspecified: Secondary | ICD-10-CM

## 2022-05-18 DIAGNOSIS — R12 Heartburn: Secondary | ICD-10-CM

## 2022-05-18 DIAGNOSIS — IMO0002 Ulcer: Secondary | ICD-10-CM

## 2022-05-18 DIAGNOSIS — E039 Hypothyroidism, unspecified: Secondary | ICD-10-CM

## 2022-05-18 DIAGNOSIS — K5090 Crohn's disease, unspecified, without complications: Secondary | ICD-10-CM

## 2022-05-18 DIAGNOSIS — R110 Nausea: Secondary | ICD-10-CM

## 2022-05-18 DIAGNOSIS — E119 Type 2 diabetes mellitus without complications: Secondary | ICD-10-CM

## 2022-05-18 DIAGNOSIS — R1084 Generalized abdominal pain: Secondary | ICD-10-CM

## 2022-05-18 DIAGNOSIS — K219 Gastro-esophageal reflux disease without esophagitis: Secondary | ICD-10-CM

## 2022-05-18 DIAGNOSIS — M2550 Pain in unspecified joint: Secondary | ICD-10-CM

## 2022-05-18 DIAGNOSIS — F32A Depression: Secondary | ICD-10-CM

## 2022-05-18 DIAGNOSIS — F419 Anxiety disorder, unspecified: Secondary | ICD-10-CM

## 2022-05-18 DIAGNOSIS — E785 Hyperlipidemia, unspecified: Secondary | ICD-10-CM

## 2022-05-18 DIAGNOSIS — M797 Fibromyalgia: Secondary | ICD-10-CM

## 2022-05-18 DIAGNOSIS — D751 Secondary polycythemia: Secondary | ICD-10-CM

## 2022-05-18 MED ORDER — DIPHENOXYLATE-ATROPINE 2.5-0.025 MG PO TAB
1 | ORAL_TABLET | Freq: Four times a day (QID) | ORAL | 3 refills | 15.00000 days | Status: AC | PRN
Start: 2022-05-18 — End: ?

## 2022-05-18 NOTE — Progress Notes
[facility] CLIN[facility] NOTE       SUBJECTIVE:   He continues suffer from intractable abdominal pain.  Really not much relief from intercostal nerve blocks.  The pain is epigastric and radiates to his back         Review of Systems    Current Outpatient Medications:   ?  atogepant (QULIPTA) 60 mg tablet, Take one tablet by mouth., Disp: , Rfl:   ?  BD ULTRA-FINE SHORT PEN NEEDLE 31 gauge x 5/16 pen needle, USE TO INJECT INSULIN 4 TIMES A DAY, Disp: , Rfl:   ?  cetirizine (ZYRTEC) 10 mg tablet, Daily, Disp: , Rfl:   ?  chlordiazePOXIDE-clidinium (LIBRAX (WITH CLINIDIUM)) 2.5/5 mg capsule, Take one capsule by mouth three times daily., Disp: 90 capsule, Rfl: 3  ?  ciprofloxacin (CIPRO) 250 mg tablet, Take one tablet by mouth twice daily for 10 days., Disp: 20 tablet, Rfl: 0  ?  clonazePAM (KLONOPIN) 1 mg tablet, TAKE 1 TABLET BY MOUTH TWICE A DAY AS NEEDED FOR ANXIETY, Disp: , Rfl:   ?  cyanocobalamin (VITAMIN B-12) 100 mcg tablet, Take one tablet by mouth daily., Disp: , Rfl:   ?  dicyclomine (BENTYL) 20 mg tablet, TAKE ONE TABLET BY MOUTH FOUR TIMES DAILY AS NEEDED, Disp: 360 tablet, Rfl: 1  ?  diphenhydrAMINE (BENADRYL) 25 mg capsule, 1 Cap, QID, PO, PRN, Allergy, Disp: , Rfl:   ?  diphenoxylate-atropine (LOMOTIL) 2.5-0.025 mg tablet, Take one tablet by mouth four times daily as needed for Diarrhea., Disp: 60 tablet, Rfl: 3  ?  ERGOcalciferoL (vitamin D2) (DRISDOL) 1,250 mcg (50,000 unit) capsule, TAKE 1 CAPSULE BY MOUTH EVERY 7 DAYS FOR VITAMIN D DEFICIENCY, Disp: 12 capsule, Rfl: 0  ?  famotidine (PEPCID) 20 mg tablet, Take one tablet by mouth daily., Disp: , Rfl:   ?  FARXIGA 10 mg tablet, Take one tablet by mouth every morning., Disp: , Rfl:   ?  FETZIMA 40 mg capsule, Take one capsule by mouth daily., Disp: , Rfl:   ?  fluconazole (DIFLUCAN) 100 mg tablet, Take one tablet by mouth daily., Disp: 14 tablet, Rfl: 0  ?  HUMALOG KWIKPEN INSULIN 100 unit/mL subcutaneous PEN, Inject four units under the skin three times daily with meals. Correct for blood sugar over 220. BG 221-260: give 2 units. BG 261-300 give 4 units. BG 301-350: give 6 units. 351-400: 8 units. >400: 10 units, Disp: 15 mL, Rfl: 0  ?  hydrOXYzine HCL (ATARAX) 25 mg tablet, Take one tablet by mouth every 6 hours as needed. Indications: anxious, Disp: 30 tablet, Rfl: 0  ?  L-Methylfolate (DEPLIN 15) 15 mg tablet, Take one tablet by mouth daily., Disp: , Rfl:   ?  levothyroxine (SYNTHROID) 25 mcg tablet, Take one tablet by mouth daily 30 minutes before breakfast., Disp: , Rfl:   ?  lipase-protease-amylase (PANCREAZE) 37,000-97,300- 149,900 unit capsule, Take one capsule by mouth three times daily. Please use these instructions: Take two capsules WITH each meal, and one capsule with each snack that contains fat or protein. May have three snacks daily., Disp: 270 capsule, Rfl: 3  ?  methylprednisolone (MEDROL) 32 mg tablet, Take one tablet by mouth as directed. Take 32mg  by mouth 12 hours before appointment, then take 32mg  by mouth 2 hours before appointment time, Disp: 2 tablet, Rfl: 0  ?  methylprednisolone (MEDROL) 32 mg tablet, Take one tablet by mouth as directed. Take 32mg  by mouth 12 hours before appointment, then take  32mg  by mouth 2 hours before appointment time, Disp: 2 tablet, Rfl: 0  ?  metroNIDAZOLE (FLAGYL) 500 mg tablet, Take one tablet by mouth twice daily for 10 days. Take with food. Do not drink alcohol while on metronidazole., Disp: 20 tablet, Rfl: 0  ?  nortriptyline (PAMELOR) 50 mg capsule, Take one capsule by mouth at bedtime daily., Disp: 30 capsule, Rfl: 0  ?  ONETOUCH DELICA PLUS LANCET 33 gauge, USE TO TEST BLOOD SUGAR 4 TIMES PER DAY., Disp: , Rfl:   ?  OXcarbazepine (TRILEPTAL) 150 mg tablet, Take one tablet by mouth twice daily., Disp: , Rfl:   ?  pantoprazole DR (PROTONIX) 40 mg tablet, Take one tablet by mouth twice daily., Disp: 180 tablet, Rfl: 3  ?  peg-electrolyte solution (NULYTELY LEMON-LIME) 420 gram oral solution, Split dose by mouth as directed by GI office, Disp: 4000 mL, Rfl: 0  ?  peg3350-sod sul-NaCl-KCl-asb-C (PLENVU) 140-9-5.2 gram ppks, Take 1 each by mouth as directed. Split dose, Disp: 1 packet, Rfl: 0  ?  prazosin (MINIPRESS) 1 mg capsule, , Disp: , Rfl:   ?  prazosin (MINIPRESS) 2 mg capsule, Take one capsule by mouth at bedtime daily., Disp: , Rfl:   ?  predniSONE (DELTASONE) 10 mg tablet, Take four tablets by mouth daily for 7 days, THEN three tablets daily for 7 days, THEN two tablets daily for 14 days, THEN one tablet daily for 14 days., Disp: 91 tablet, Rfl: 0  ?  rosuvastatin (CRESTOR) 40 mg tablet, Take one tablet by mouth daily., Disp: , Rfl:   ?  STELARA 90 mg/mL syringe, INJECT 1 ML UNDER THE SKIN EVERY 28 DAYS FOR CROHNS DISEASE. DOSE INCREASE TO EVERY 4 WEEKS, Disp: 1 mL, Rfl: 11  ?  SUMAtriptan succinate (IMITREX) 25 mg tablet, , Disp: , Rfl:   ?  traZODone (DESYREL) 50 mg tablet, Take one tablet by mouth at bedtime as needed. Indications: insomnia associated with depression, Disp: 30 tablet, Rfl: 0  ?  TRESIBA FLEXTOUCH U-200 200 unit/mL (3 mL) injection PEN, INJECT 50 UNITS SUBCUTANEOUSLY DAILY, Disp: , Rfl:   ?  TRINTELLIX 20 mg tablet, Take one tablet by mouth daily., Disp: , Rfl:   ?  VASCEPA 1 gram capsule, TAKE 2 CAPSULES BY MOUTH TWICE A DAY, Disp: , Rfl:   ?  venlafaxine XR (EFFEXOR XR) 75 mg capsule, Take one capsule by mouth daily. Take with food., Disp: 30 capsule, Rfl: 0  Allergies   Allergen Reactions   ? Iodinated Contrast Media FLUSHING (SKIN) and REDNESS   ? Iodine And Iodide Containing Products RASH   ? Azathioprine RASH   ? Chlorhexidine RASH   ? Chlorphen-Phenyleph-Hydrocodon ITCHING   ? Gadolinium-Containing Contrast Media FLUSHING (SKIN)     Pt reported has had reaction in past x2 at other location. Pt reports was given benadryl with 2nd reaction.      ? Oxycodone ITCHING     Physical Exam  Vitals:    05/18/22 1202 05/18/22 1206   BP:  112/81   BP Source:  Arm, Right Upper   Pulse: 106   SpO2:  97%   PainSc: Five Five     Oswestry Total Score:: 60  Pain Score: Five  There is no height or weight on file to calculate BMI.    His belly is tight he has mild tenderness in the thoracic spine       IMPRESSION:  1. Intractable generalized abdominal pain  PLAN:   We discussed trying a celiac plexus block given no relief with intercostal block.  He wants to proceed with this and we will get it scheduled

## 2022-05-18 NOTE — Telephone Encounter
Refill request received for:    diphenoxylate-atropine (LOMOTIL) 2.5-0.025 mg tablet     Sig: TAKE 1 TABLET BY MOUTH FOUR TIMES A DAY AS NEEDED FOR DIARRHEA     Last OV: 02/19/22  Last fill: 04/21/22    Routing to Dr. Janene Madeira for approval/refusal

## 2022-05-19 ENCOUNTER — Encounter: Admit: 2022-05-19 | Discharge: 2022-05-19 | Payer: Commercial Managed Care - HMO

## 2022-05-19 DIAGNOSIS — E559 Vitamin D deficiency, unspecified: Secondary | ICD-10-CM

## 2022-05-19 MED ORDER — ERGOCALCIFEROL (VITAMIN D2) 1,250 MCG (50,000 UNIT) PO CAP
ORAL_CAPSULE | ORAL | 0 refills | 56.00000 days | Status: AC
Start: 2022-05-19 — End: ?

## 2022-05-19 NOTE — Telephone Encounter
Refill request received for:    ERGOcalciferoL (vitamin D2) (DRISDOL) 1,250 mcg (50,000 unit) capsule     Sig: TAKE 1 CAPSULE BY MOUTH EVERY 7 DAYS FOR VITAMIN D DEFICIENCY     Last OV: 02/19/22  Last fill: 02/25/22    Routing to Dr. Janene Madeira for approval/refusal

## 2022-05-26 ENCOUNTER — Encounter: Admit: 2022-05-26 | Discharge: 2022-05-26 | Payer: Commercial Managed Care - HMO

## 2022-05-27 ENCOUNTER — Encounter: Admit: 2022-05-27 | Discharge: 2022-05-27 | Payer: Commercial Managed Care - HMO

## 2022-05-27 DIAGNOSIS — K5000 Crohn's disease of small intestine without complications: Secondary | ICD-10-CM

## 2022-05-27 MED ORDER — CHLORDIAZEPOXIDE-CLIDINIUM 5-2.5 MG PO CAP
1 | ORAL_CAPSULE | Freq: Three times a day (TID) | ORAL | 2 refills | Status: AC
Start: 2022-05-27 — End: ?

## 2022-05-27 NOTE — Telephone Encounter
Refill request received for:      chlordiazePOXIDE-clidinium (LIBRAX (WITH CLIDINIUM)) 2.5/5 mg capsule     Sig: TAKE 1 CAPSULE BY MOUTH THREE TIMES A DAY     Last OV: 02/19/22  Last fill: 05/04/22, request for 90-day fill    Routing to Dr. Janene Madeira for approval/refusal

## 2022-05-29 ENCOUNTER — Encounter: Admit: 2022-05-29 | Discharge: 2022-05-29 | Payer: Commercial Managed Care - HMO

## 2022-06-09 ENCOUNTER — Encounter: Admit: 2022-06-09 | Discharge: 2022-06-09 | Payer: Commercial Managed Care - HMO

## 2022-06-09 DIAGNOSIS — R197 Diarrhea, unspecified: Secondary | ICD-10-CM

## 2022-06-09 MED ORDER — DIPHENOXYLATE-ATROPINE 2.5-0.025 MG PO TAB
1 | ORAL_TABLET | Freq: Four times a day (QID) | ORAL | 3 refills | 15.00000 days | Status: AC | PRN
Start: 2022-06-09 — End: ?

## 2022-06-09 NOTE — Telephone Encounter
Refill request received for:    diphenoxylate-atropine (LOMOTIL) 2.5-0.025 mg tablet     Sig: TAKE 1 TABLET BY MOUTH FOUR TIMES A DAY AS NEEDED FOR DIARRHEA         Last OV: 02/19/22  Last fill: 05/18/22    Routing to Dr. Janene Madeira for approval/refusal

## 2022-06-16 ENCOUNTER — Encounter: Admit: 2022-06-16 | Discharge: 2022-06-16 | Payer: Commercial Managed Care - HMO

## 2022-06-24 ENCOUNTER — Encounter: Admit: 2022-06-24 | Discharge: 2022-06-24 | Payer: Commercial Managed Care - HMO

## 2022-06-24 DIAGNOSIS — R55 Syncope and collapse: Secondary | ICD-10-CM

## 2022-06-25 ENCOUNTER — Encounter: Admit: 2022-06-25 | Discharge: 2022-06-25 | Payer: Commercial Managed Care - HMO

## 2022-07-06 ENCOUNTER — Encounter: Admit: 2022-07-06 | Discharge: 2022-07-06 | Payer: Commercial Managed Care - HMO

## 2022-07-06 MED ORDER — STELARA 90 MG/ML SC SYRG
90 mg | SUBCUTANEOUS | 0 refills | 38.50000 days | Status: AC
Start: 2022-07-06 — End: ?

## 2022-07-06 NOTE — Progress Notes
Prescription sent to [facility] to aid with PA.     Dorothe Pea, PHARMD

## 2022-07-07 ENCOUNTER — Encounter: Admit: 2022-07-07 | Discharge: 2022-07-07 | Payer: Commercial Managed Care - HMO

## 2022-07-07 NOTE — Progress Notes
Pharmacy Benefits Investigation    Medication name: STELARA 44 MG/ML SC SYRG    The insurance requires a prior authorization for the medication. The prior authorization renewal was submitted via CoverMyMeds. Will follow up in 2 business days.    PA number: [phone] Patient Advocate

## 2022-07-08 ENCOUNTER — Encounter: Admit: 2022-07-08 | Discharge: 2022-07-08 | Payer: Commercial Managed Care - HMO

## 2022-07-09 ENCOUNTER — Encounter: Admit: 2022-07-09 | Discharge: 2022-07-09 | Payer: Commercial Managed Care - HMO

## 2022-07-09 NOTE — Progress Notes
Pharmacy Benefits Investigation    Medication name: STELARA 53 MG/ML SC SYRG    The prior authorization was approved for Jamie Belger from 07/06/2022 through 07/07/2023. The prior authorization number is [id number].    The out of pocket cost is unknown because the medication cannot be filled at [facility]. [facility]. Notified the clinic pharmacist for next steps.    [facility] Patient Advocate

## 2022-07-14 ENCOUNTER — Encounter: Admit: 2022-07-14 | Discharge: 2022-07-14 | Payer: Commercial Managed Care - HMO

## 2022-07-14 NOTE — Telephone Encounter
Called and spoke with pt regarding instructions for tilt test 07/17/22, also relayed information in [facility]. Reviewed plan with the patient. Patient verbalized understanding and does not have any further questions or concerns. No further education requested from patient. Patient has our contact information for future needs.

## 2022-07-15 ENCOUNTER — Encounter: Admit: 2022-07-15 | Discharge: 2022-07-15 | Payer: Commercial Managed Care - HMO

## 2022-07-15 NOTE — Progress Notes
Specialty Medication Reassessment Attempt    Medication name: STELARA SC    Attempt to reach Alan [name] to verify compliance and assess tolerance of their specialty medication. No answer. Left voicemail asking patient to return call to the pharmacist at [phone].    Dorothe Pea, PHARMD

## 2022-07-17 ENCOUNTER — Encounter: Admit: 2022-07-17 | Discharge: 2022-07-17 | Payer: Commercial Managed Care - HMO

## 2022-07-17 ENCOUNTER — Ambulatory Visit: Admit: 2022-07-17 | Discharge: 2022-07-17 | Payer: Commercial Managed Care - HMO

## 2022-07-17 DIAGNOSIS — R55 Syncope and collapse: Secondary | ICD-10-CM

## 2022-07-17 MED ORDER — SODIUM CHLORIDE 0.9 % IV SOLP
1000 mL | INTRAVENOUS | 0 refills | Status: AC
Start: 2022-07-17 — End: ?

## 2022-07-17 NOTE — Progress Notes
Pharmacy Medication Reassessment    Indication/Regimen  The regimen of STELARA SC indefinitely is appropriate for Alan [name] who has Crohn's disease involving terminal ileum (HCC).    Renal dose adjustments are not required. Hepatic dose adjustments are not required. Dose titration is not required.    The patient has the ability to self-administer the medication(s).    Baseline Characteristics  Disease severity: moderate-severe  Disease description: ileitis    Therapeutic Goals and Monitoring  The goal of therapy is to control symptoms and achieve remission.    Disease activity (patient reported): occasionally active  Patient reported improvement: 50-74 percent improvement in symptoms since starting therapy  Clinical symptoms: diarrhea, abdominal pain and cramping  Bowel movements per day: 4-6  Hospitalization visit for IBD (past 90 days): 0  ED visit for IBD (past 90 days): 0  Patient is not currently in clinical remission. Patient reports general improvment on symptoms but still not in clinical remission.    Endoscopic Evaluation  Endoscopic procedure completed: yes  Evaluation: stable  Remission: not achieved  Colonoscopy Sept 2023 - few ulcers in terminal ileum, normal colon    Histologic Evaluation  Test completed: yes  Evaluation: stable  Remission: not achieved  Biopsies showed Ileal mucosa with focal active ileitis.    Evaluation  The patient is making progress toward achieving their therapeutic goal. Patient has improvement from baseline, but still asymptomatic. Will continue to monitor. Appt in Febrary 2024 The plan is to continue current therapy.    Past Medical History and Comorbidities  Patient Active Problem List   Diagnosis    Crohn's disease involving terminal ileum (HCC)    Moderate episode of recurrent major depressive disorder (HCC)    Generalized anxiety disorder with panic attacks     Additional comorbidities: no    Labs and Diagnostic Tests  No results found for: TSPOTTB, QUANTIFERTB  No results found for: HEPBSAG, HEPBEAG, HEPBSURABQT, HEPBSAB   Lab Results   Component Value Date/Time    RBC 5.48 12/16/2021 01:20 PM    HGB 15.9 12/16/2021 01:20 PM    HCT 47.1 12/16/2021 01:20 PM    MCV 86.1 12/16/2021 01:20 PM    MCH 29.1 12/16/2021 01:20 PM    MCHC 33.8 12/16/2021 01:20 PM    RDW 15.0 12/16/2021 01:20 PM    PLTCT 256 12/16/2021 01:20 PM    MPV 7.2 12/16/2021 01:20 PM         Allergies  Allergies   Allergen Reactions    Iodinated Contrast Media FLUSHING (SKIN) and REDNESS    Iodine And Iodide Containing Products RASH    Azathioprine RASH    Chlorhexidine RASH    Chlorphen-Phenyleph-Hydrocodon ITCHING    Gadolinium-Containing Contrast Media FLUSHING (SKIN)     Pt reported has had reaction in past x2 at other location. Pt reports was given benadryl with 2nd reaction.       Oxycodone ITCHING        Immunizations  Vaccine history was reviewed with the patient. Education was provided on the importance of completing vaccines. The patient should avoid live vaccines.    Immunization History   Administered Date(s) Administered    COVID-19 (PFIZER), mRNA vacc, 30 mcg/0.3 mL (PF) 02/28/2020       Medication Reconciliation  Medication history and reconciliation were performed (including prescription medications, supplements, over the counter, and herbal products). The medication list was updated and the patient's current medication list is included. The patient was instructed to speak with their health  care provider before starting any new drug, including prescription or over the counter, natural / herbal products, or vitamins.    Drug Interactions    Drug-Drug Interactions  Drug-drug interactions were evaluated. There were not clinically significant drug-drug interactions.     Drug-Food Interactions  Drug-food interactions were not evaluated (NA - not oral).    Home Medications    Medication Sig   atogepant (QULIPTA) 60 mg tablet Take one tablet by mouth.   BD ULTRA-FINE SHORT PEN NEEDLE 31 gauge x 5/16 pen needle USE TO INJECT INSULIN 4 TIMES A DAY   cetirizine (ZYRTEC) 10 mg tablet Daily   chlordiazePOXIDE-clidinium (LIBRAX (WITH CLIDINIUM)) 2.5/5 mg capsule TAKE 1 CAPSULE BY MOUTH THREE TIMES A DAY   clonazePAM (KLONOPIN) 1 mg tablet TAKE 1 TABLET BY MOUTH TWICE A DAY AS NEEDED FOR ANXIETY   cyanocobalamin (VITAMIN B-12) 100 mcg tablet Take one tablet by mouth daily.   dicyclomine (BENTYL) 20 mg tablet TAKE ONE TABLET BY MOUTH FOUR TIMES DAILY AS NEEDED   diphenhydrAMINE (BENADRYL) 25 mg capsule 1 Cap, QID, PO, PRN, Allergy   diphenoxylate-atropine (LOMOTIL) 2.5-0.025 mg tablet TAKE 1 TABLET BY MOUTH FOUR TIMES A DAY AS NEEDED FOR DIARRHEA   ERGOcalciferoL (vitamin D2) (DRISDOL) 1,250 mcg (50,000 unit) capsule TAKE 1 CAPSULE BY MOUTH EVERY 7 DAYS FOR VITAMIN D DEFICIENCY   famotidine (PEPCID) 20 mg tablet Take one tablet by mouth daily.   FARXIGA 10 mg tablet Take one tablet by mouth every morning.   FETZIMA 40 mg capsule Take one capsule by mouth daily.   fluconazole (DIFLUCAN) 100 mg tablet Take one tablet by mouth daily.   HUMALOG KWIKPEN INSULIN 100 unit/mL subcutaneous PEN Inject four units under the skin three times daily with meals. Correct for blood sugar over 220. BG 221-260: give 2 units. BG 261-300 give 4 units. BG 301-350: give 6 units. 351-400: 8 units. >400: 10 units   hydrOXYzine HCL (ATARAX) 25 mg tablet Take one tablet by mouth every 6 hours as needed. Indications: anxious   L-Methylfolate (DEPLIN 15) 15 mg tablet Take one tablet by mouth daily.   levothyroxine (SYNTHROID) 25 mcg tablet Take one tablet by mouth daily 30 minutes before breakfast.   lipase-protease-amylase (PANCREAZE) 37,000-97,300- 149,900 unit capsule Take one capsule by mouth three times daily. Please use these instructions: Take two capsules WITH each meal, and one capsule with each snack that contains fat or protein. May have three snacks daily.   methylprednisolone (MEDROL) 32 mg tablet Take one tablet by mouth as directed. Take 32mg  by mouth 12 hours before appointment, then take 32mg  by mouth 2 hours before appointment time   methylprednisolone (MEDROL) 32 mg tablet Take one tablet by mouth as directed. Take 32mg  by mouth 12 hours before appointment, then take 32mg  by mouth 2 hours before appointment time   nortriptyline (PAMELOR) 50 mg capsule Take one capsule by mouth at bedtime daily.   ONETOUCH DELICA PLUS LANCET 33 gauge USE TO TEST BLOOD SUGAR 4 TIMES PER DAY.   OXcarbazepine (TRILEPTAL) 150 mg tablet Take one tablet by mouth twice daily.   pantoprazole DR (PROTONIX) 40 mg tablet Take one tablet by mouth twice daily.   peg-electrolyte solution (NULYTELY LEMON-LIME) 420 gram oral solution Split dose by mouth as directed by GI office   peg3350-sod sul-NaCl-KCl-asb-C (PLENVU) 140-9-5.2 gram ppks Take 1 each by mouth as directed. Split dose   prazosin (MINIPRESS) 2 mg capsule Take one capsule by mouth at bedtime daily.  rosuvastatin (CRESTOR) 40 mg tablet Take one tablet by mouth daily.   STELARA 90 mg/mL syringe INJECT 1 ML UNDER THE SKIN EVERY 28 DAYS FOR CROHNS DISEASE. DOSE INCREASE TO EVERY 4 WEEKS   SUMAtriptan succinate (IMITREX) 25 mg tablet    traZODone (DESYREL) 50 mg tablet Take one tablet by mouth at bedtime as needed. Indications: insomnia associated with depression   TRESIBA FLEXTOUCH U-200 200 unit/mL (3 mL) injection PEN INJECT 50 UNITS SUBCUTANEOUSLY DAILY   TRINTELLIX 20 mg tablet Take one tablet by mouth daily.   VASCEPA 1 gram capsule TAKE 2 CAPSULES BY MOUTH TWICE A DAY   venlafaxine XR (EFFEXOR XR) 75 mg capsule Take one capsule by mouth daily. Take with food.     Adverse Drug Reactions  Adverse drug reactions were reviewed with the patient.    Significant adverse drug reaction(s) were not identified.    Side effect(s) were not reported.    The patient does not have injection related concerns.    The patient does not have infection related concerns.    Adherence  Refill and adherence history were reviewed with the patient. The patient was educated on the importance of adherence.    Patient is adherent with refills: yes  Patient is meeting refill adherence goal: yes    Patient reported 0 missed doses over the past 90 days.  Significance of missed doses: NA - no missed doses   Patient is meeting reported adherence goal.    Safety Precautions    Risk Evaluation and Mitigation (REMS) Assessment: REMS is not required for this medication.    Safety precautions were addressed and discussed with the patient as applicable.    Contraindications: Alan [name] does not have contraindications to this medication.      Pregnancy Status: Male, education not applicable.    Medication Education  Counseling was not completed because patient was previously educated and did not require additional counseling.    Alan [name] was given the opportunity to ask questions but did not have any questions at the time. Patient was reminded of the refill process and encouraged to call with questions. The monitoring and follow-up plan was discussed with the patient. The patient was instructed to contact their health care provider if their symptoms or health problems do not get better or if they become worse. The patient should contact the specialty pharmacy at [phone] if they have any questions or concerns regarding their medication therapy. The patient verbalized acceptance and understanding.    Follow-up Plan  The patient will be reassessed within 1 year.    Discussed option to fill at a [facility]. The medication(s) will be received from an outside pharmacy ([facility]) based on insurance mandate.    Mliss Fritz, PHARMD

## 2022-07-20 ENCOUNTER — Encounter: Admit: 2022-07-20 | Discharge: 2022-07-20 | Payer: Commercial Managed Care - HMO

## 2022-07-20 NOTE — Telephone Encounter
07/20/2022 2:05 PM Received letter from [employer], patient's insurance, that Alan [name] is covered from 07/07/22 to 07/07/23.

## 2022-07-28 ENCOUNTER — Encounter: Admit: 2022-07-28 | Discharge: 2022-07-28 | Payer: Commercial Managed Care - HMO

## 2022-07-28 DIAGNOSIS — E559 Vitamin D deficiency, unspecified: Secondary | ICD-10-CM

## 2022-07-28 MED ORDER — ERGOCALCIFEROL (VITAMIN D2) 1,250 MCG (50,000 UNIT) PO CAP
ORAL_CAPSULE | ORAL | 0 refills | 56.00000 days | Status: AC
Start: 2022-07-28 — End: ?

## 2022-07-28 NOTE — Telephone Encounter
Refill request received for:     ERGOcalciferoL (vitamin D2) (DRISDOL) 1,250 mcg (50,000 unit) capsule     Sig: TAKE 1 CAPSULE BY MOUTH EVERY 7 DAYS FOR VITAMIN D DEFICIENCY         Last OV: 02/19/22  Last fill: 05/19/22    Medication does not meet criteria for nurse-driven renewal.  Routing to Dr. York Cerise for approval/refusal

## 2022-08-05 ENCOUNTER — Encounter: Admit: 2022-08-05 | Discharge: 2022-08-05 | Payer: Commercial Managed Care - HMO

## 2022-08-06 ENCOUNTER — Encounter: Admit: 2022-08-06 | Discharge: 2022-08-06 | Payer: Commercial Managed Care - HMO

## 2022-08-06 MED ORDER — STELARA 90 MG/ML SC SYRG
90 mg | SUBCUTANEOUS | 0 refills | 38.50000 days | Status: AC
Start: 2022-08-06 — End: ?

## 2022-08-10 ENCOUNTER — Encounter: Admit: 2022-08-10 | Discharge: 2022-08-10 | Payer: Commercial Managed Care - HMO

## 2022-08-10 NOTE — Progress Notes
Pharmacy Benefits Investigation    Medication name: STELARA 76 MG/ML SC SYRG    Appeal level: first    The prior authorization was previously denied by insurance. To obtain the prescribed medication and dose, the insurance requires the decision to be appealed with a letter of medical necessity.    The appeal for Alan [name] has been submitted to the insurance. A decision typically takes up to 30 business days. The specialty pharmacy team will follow up with the insurance to verify they received the materials within 5 business days.    Appeal has been submitted for Stelara, 59m once every 4 weeks.    RDietrich Pates [facility] Patient Advocate

## 2022-08-11 ENCOUNTER — Encounter: Admit: 2022-08-11 | Discharge: 2022-08-11 | Payer: Commercial Managed Care - HMO

## 2022-08-11 DIAGNOSIS — R197 Diarrhea, unspecified: Secondary | ICD-10-CM

## 2022-08-11 MED ORDER — DIPHENOXYLATE-ATROPINE 2.5-0.025 MG PO TAB
1 | ORAL_TABLET | Freq: Four times a day (QID) | ORAL | 0 refills | 15.00000 days | Status: AC | PRN
Start: 2022-08-11 — End: ?

## 2022-08-11 NOTE — Telephone Encounter
Refill request received for:       diphenoxylate-atropine (LOMOTIL) 2.5-0.025 mg tablet     Sig: TAKE 1 TABLET BY MOUTH FOUR TIMES A DAY AS NEEDED FOR DIARRHEA       Last OV: 02/19/22  Last fill: 06/09/22    Routing to Dr. York Cerise for approval/refusal   Medication does not meet criteria for nurse-driven refill

## 2022-08-12 ENCOUNTER — Encounter: Admit: 2022-08-12 | Discharge: 2022-08-12 | Payer: Commercial Managed Care - HMO

## 2022-08-12 NOTE — Progress Notes
Pharmacy Benefits Investigation    Medication name: STELARA 30 MG/ML SC SYRG    Appeal level: first    The appeal was approved for Alan [name] from 08/12/2022 through 08/13/2023. The authorization number is [id number].    The out of pocket cost is unknown because the medication cannot be filled at [facility]. The patient's insurance mandates they fill the medication at [facility].    Notified the clinic pharmacist for next steps.    This approval is for Stelara, 35m every 4 weeks.    RDietrich Pates [facility] Patient Advocate

## 2022-08-14 ENCOUNTER — Encounter: Admit: 2022-08-14 | Discharge: 2022-08-14 | Payer: Commercial Managed Care - HMO

## 2022-08-17 ENCOUNTER — Encounter: Admit: 2022-08-17 | Discharge: 2022-08-17 | Payer: Commercial Managed Care - HMO

## 2022-08-17 DIAGNOSIS — K649 Unspecified hemorrhoids: Secondary | ICD-10-CM

## 2022-08-17 MED ORDER — HYDROCORTISONE-PRAMOXINE 1-1 % RE CREA
1 g | Freq: Two times a day (BID) | RECTAL | 1 refills | 50.00000 days | Status: AC
Start: 2022-08-17 — End: ?

## 2022-08-20 ENCOUNTER — Encounter: Admit: 2022-08-20 | Discharge: 2022-08-20 | Payer: Commercial Managed Care - HMO

## 2022-08-20 NOTE — Progress Notes
For purposes of continuity of care, please fax over CBC collected on 08/20/22 for Alan [name] DOB: Dec 18, 1980 to [facility] at [phone], attn: Hoyle Sauer, RN and Dr. York Cerise

## 2022-08-21 ENCOUNTER — Encounter: Admit: 2022-08-21 | Discharge: 2022-08-21 | Payer: Commercial Managed Care - HMO

## 2022-08-21 DIAGNOSIS — K649 Unspecified hemorrhoids: Secondary | ICD-10-CM

## 2022-08-21 LAB — CBC
HEMATOCRIT: 46
HEMOGLOBIN: 16
MCH: 29
MCHC: 34
MCV: 84
MPV: 8.9 — ABNORMAL LOW
PLATELET COUNT: 265
RBC COUNT: 5.5
RDW: 14 — ABNORMAL HIGH
WBC COUNT: 11 — ABNORMAL HIGH

## 2022-08-25 ENCOUNTER — Encounter: Admit: 2022-08-25 | Discharge: 2022-08-25 | Payer: Commercial Managed Care - HMO

## 2022-08-25 DIAGNOSIS — E559 Vitamin D deficiency, unspecified: Secondary | ICD-10-CM

## 2022-08-25 MED ORDER — ERGOCALCIFEROL (VITAMIN D2) 1,250 MCG (50,000 UNIT) PO CAP
ORAL_CAPSULE | ORAL | 0 refills | 56.00000 days | Status: AC
Start: 2022-08-25 — End: ?

## 2022-08-25 NOTE — Telephone Encounter
Refill request received for:    ERGOcalciferoL (vitamin D2) (DRISDOL) 1,250 mcg (50,000 unit) capsule     Sig: TAKE 1 CAPSULE BY MOUTH EVERY 7 DAYS FOR VITAMIN D DEFICIENCY       Last OV: 02/19/22  Last fill: 07/28/22    Routing to Dr. York Cerise for approval/refusal

## 2022-08-27 ENCOUNTER — Ambulatory Visit: Admit: 2022-08-27 | Discharge: 2022-08-28 | Payer: Commercial Managed Care - HMO

## 2022-08-27 ENCOUNTER — Encounter: Admit: 2022-08-27 | Discharge: 2022-08-27 | Payer: Commercial Managed Care - HMO

## 2022-08-27 DIAGNOSIS — K5090 Crohn's disease, unspecified, without complications: Secondary | ICD-10-CM

## 2022-08-27 DIAGNOSIS — K648 Other hemorrhoids: Secondary | ICD-10-CM

## 2022-08-27 DIAGNOSIS — K5909 Other constipation: Secondary | ICD-10-CM

## 2022-08-27 DIAGNOSIS — R519 Generalized headaches: Secondary | ICD-10-CM

## 2022-08-27 DIAGNOSIS — R12 Heartburn: Secondary | ICD-10-CM

## 2022-08-27 DIAGNOSIS — K219 Gastro-esophageal reflux disease without esophagitis: Secondary | ICD-10-CM

## 2022-08-27 DIAGNOSIS — IMO0002 Ulcer: Secondary | ICD-10-CM

## 2022-08-27 DIAGNOSIS — K5000 Crohn's disease of small intestine without complications: Secondary | ICD-10-CM

## 2022-08-27 DIAGNOSIS — R110 Nausea: Secondary | ICD-10-CM

## 2022-08-27 DIAGNOSIS — K5900 Constipation, unspecified: Secondary | ICD-10-CM

## 2022-08-27 DIAGNOSIS — F419 Anxiety disorder, unspecified: Secondary | ICD-10-CM

## 2022-08-27 DIAGNOSIS — M2550 Pain in unspecified joint: Secondary | ICD-10-CM

## 2022-08-27 DIAGNOSIS — K625 Hemorrhage of anus and rectum: Secondary | ICD-10-CM

## 2022-08-27 DIAGNOSIS — F99 Mental disorder, not otherwise specified: Secondary | ICD-10-CM

## 2022-08-27 DIAGNOSIS — E119 Type 2 diabetes mellitus without complications: Secondary | ICD-10-CM

## 2022-08-27 DIAGNOSIS — E039 Hypothyroidism, unspecified: Secondary | ICD-10-CM

## 2022-08-27 DIAGNOSIS — F32A Depression: Secondary | ICD-10-CM

## 2022-08-27 DIAGNOSIS — E785 Hyperlipidemia, unspecified: Secondary | ICD-10-CM

## 2022-08-27 DIAGNOSIS — D751 Secondary polycythemia: Secondary | ICD-10-CM

## 2022-08-27 DIAGNOSIS — I10 Essential (primary) hypertension: Secondary | ICD-10-CM

## 2022-08-27 DIAGNOSIS — M797 Fibromyalgia: Secondary | ICD-10-CM

## 2022-08-27 DIAGNOSIS — K638219 Small intestinal bacterial overgrowth (SIBO): Secondary | ICD-10-CM

## 2022-08-27 MED ORDER — METRONIDAZOLE 500 MG PO TAB
500 mg | ORAL_TABLET | Freq: Two times a day (BID) | ORAL | 0 refills | Status: AC
Start: 2022-08-27 — End: ?

## 2022-08-27 MED ORDER — LINZESS 145 MCG PO CAP
145 ug | ORAL_CAPSULE | Freq: Every day | ORAL | 11 refills | 30.00000 days | Status: AC
Start: 2022-08-27 — End: ?

## 2022-08-27 MED ORDER — CIPROFLOXACIN HCL 250 MG PO TAB
250 mg | ORAL_TABLET | Freq: Two times a day (BID) | ORAL | 0 refills | 10.00000 days | Status: AC
Start: 2022-08-27 — End: ?

## 2022-08-27 NOTE — Progress Notes
Telehealth Visit Note    Date of Service: 08/27/2022    Subjective:         Alan [name] is a 42 y.o. male.    History of Present Illness    I had a telehealth follow-up appointment today with Alan [name].  I have followed him for several years for management of his mild terminal ileal Crohn's disease.  He is currently on Stelara every 4 weeks.  He underwent EGD and colonoscopy September 2023.  EGD showed some mild esophageal stenosis that was dilated.  Biopsies were negative for EOE, H. pylori and celiac disease.  Colonoscopy showed a few small erosions in his terminal ileum.  He also had some external hemorrhoids.  The colon was otherwise normal.  He is currently on pantoprazole and dicyclomine and pancreatic enzyme supplementation and Stelara.  He had a CT abdomen pelvis November 2023 that was negative for any acute findings.  The patient has been struggling with constipation lately and rectal bleeding.  When he has larger or solid stools he has some bright red blood in his stool.  He was evaluated in the emergency department last week and hemoglobin was normal at 16.2.  He is not still having rectal bleeding.  He generally takes milk of magnesia or Dulcolax for treatment of his constipation.  The bleeding tends to come and go.  It is worse when he has hard or solid stools.  I did give him some hemorrhoid cream and this has helped somewhat.  The patient states that antibiotics have helped in the past for bloating and abdominal pain.  Patient is not having a lot of issues with dysphagia at present.       Review of Systems   Constitutional: Negative.    HENT:  Positive for trouble swallowing.    Eyes: Negative.    Respiratory:  Positive for choking.    Cardiovascular: Negative.    Gastrointestinal:  Positive for abdominal distention, abdominal pain, anal bleeding, blood in stool, constipation, diarrhea, nausea and rectal pain.   Endocrine: Negative.    Genitourinary: Negative.    Musculoskeletal: Negative. Skin: Negative.    Allergic/Immunologic: Negative.    Neurological: Negative.    Hematological: Negative.    Psychiatric/Behavioral: Negative.     A complete review of systems was obtained from the patient and all other systems were reviewed and negative.    Objective:          atogepant (QULIPTA) 60 mg tablet Take one tablet by mouth.    BD ULTRA-FINE SHORT PEN NEEDLE 31 gauge x 5/16 pen needle USE TO INJECT INSULIN 4 TIMES A DAY    cetirizine (ZYRTEC) 10 mg tablet Daily    chlordiazePOXIDE-clidinium (LIBRAX (WITH CLIDINIUM)) 2.5/5 mg capsule TAKE 1 CAPSULE BY MOUTH THREE TIMES A DAY    clonazePAM (KLONOPIN) 1 mg tablet TAKE 1 TABLET BY MOUTH TWICE A DAY AS NEEDED FOR ANXIETY    cyanocobalamin (VITAMIN B-12) 100 mcg tablet Take one tablet by mouth daily.    diazePAM (VALIUM) 10 mg tablet Take one tablet by mouth twice daily.    dicyclomine (BENTYL) 20 mg tablet TAKE ONE TABLET BY MOUTH FOUR TIMES DAILY AS NEEDED    diphenhydrAMINE (BENADRYL) 25 mg capsule 1 Cap, QID, PO, PRN, Allergy    diphenoxylate-atropine (LOMOTIL) 2.5-0.025 mg tablet TAKE 1 TABLET BY MOUTH FOUR TIMES A DAY AS NEEDED FOR DIARRHEA    ERGOcalciferoL (vitamin D2) (DRISDOL) 1,250 mcg (50,000 unit) capsule TAKE 1 CAPSULE BY MOUTH EVERY  7 DAYS FOR VITAMIN D DEFICIENCY    famotidine (PEPCID) 20 mg tablet Take one tablet by mouth daily.    FARXIGA 10 mg tablet Take one tablet by mouth every morning.    FETZIMA 40 mg capsule Take one capsule by mouth daily.    fluconazole (DIFLUCAN) 100 mg tablet Take one tablet by mouth daily.    HUMALOG KWIKPEN INSULIN 100 unit/mL subcutaneous PEN Inject four units under the skin three times daily with meals. Correct for blood sugar over 220. BG 221-260: give 2 units. BG 261-300 give 4 units. BG 301-350: give 6 units. 351-400: 8 units. >400: 10 units    hydrOXYzine HCL (ATARAX) 25 mg tablet Take one tablet by mouth every 6 hours as needed. Indications: anxious    L-Methylfolate (DEPLIN 15) 15 mg tablet Take one tablet by mouth daily.    levothyroxine (SYNTHROID) 25 mcg tablet Take one tablet by mouth daily 30 minutes before breakfast.    lipase-protease-amylase (PANCREAZE) 37,000-97,300- 149,900 unit capsule Take one capsule by mouth three times daily. Please use these instructions: Take two capsules WITH each meal, and one capsule with each snack that contains fat or protein. May have three snacks daily.    methylprednisolone (MEDROL) 32 mg tablet Take one tablet by mouth as directed. Take 32mg  by mouth 12 hours before appointment, then take 32mg  by mouth 2 hours before appointment time    methylprednisolone (MEDROL) 32 mg tablet Take one tablet by mouth as directed. Take 32mg  by mouth 12 hours before appointment, then take 32mg  by mouth 2 hours before appointment time    nortriptyline (PAMELOR) 50 mg capsule Take one capsule by mouth at bedtime daily.    ONETOUCH DELICA PLUS LANCET 33 gauge USE TO TEST BLOOD SUGAR 4 TIMES PER DAY.    OXcarbazepine (TRILEPTAL) 150 mg tablet Take one tablet by mouth twice daily.    pantoprazole DR (PROTONIX) 40 mg tablet Take one tablet by mouth twice daily.    peg-electrolyte solution (NULYTELY LEMON-LIME) 420 gram oral solution Split dose by mouth as directed by GI office    peg3350-sod sul-NaCl-KCl-asb-C (PLENVU) 140-9-5.2 gram ppks Take 1 each by mouth as directed. Split dose    pramoxine-hydrocortisone (ANALPRAM-HC) 1-1 % rectal cream Insert or Apply one g to rectal area as directed twice daily.    prazosin (MINIPRESS) 2 mg capsule Take one capsule by mouth at bedtime daily.    rosuvastatin (CRESTOR) 40 mg tablet Take one tablet by mouth daily.    STELARA 90 mg/mL syringe INJECT 1 ML UNDER THE SKIN EVERY 28 DAYS FOR CROHNS DISEASE. DOSE INCREASE TO EVERY 4 WEEKS    SUMAtriptan succinate (IMITREX) 25 mg tablet     traZODone (DESYREL) 50 mg tablet Take one tablet by mouth at bedtime as needed. Indications: insomnia associated with depression    TRESIBA FLEXTOUCH U-200 200 unit/mL (3 mL) injection PEN INJECT 50 UNITS SUBCUTANEOUSLY DAILY    TRINTELLIX 20 mg tablet Take one tablet by mouth daily.    VASCEPA 1 gram capsule TAKE 2 CAPSULES BY MOUTH TWICE A DAY    venlafaxine XR (EFFEXOR XR) 75 mg capsule Take one capsule by mouth daily. Take with food.      Telehealth Patient Reported Vitals       Row Name 08/27/22 1110                Weight: 95.3 kg (210 lb)        Height: 175.3 cm (5' 9.02)  Pain Score: Five        Pain Location: ABDOMEN                      Telehealth Body Mass Index: 31 at 08/27/2022 11:12 AM    Physical Exam  Vital signs were reviewed  Patient is not in distress  Complete physical exam was not performed due to telehealth visit only        Assessment and Plan:              Alan [name] was seen today for crohn's disease.    Diagnoses and all orders for this visit:    Crohn's disease involving terminal ileum (HCC)    Other hemorrhoids    Rectal bleeding    Small intestinal bacterial overgrowth (SIBO)  -     ciprofloxacin (CIPRO) 250 mg tablet; Take one tablet by mouth twice daily for 10 days.  -     metroNIDAZOLE (FLAGYL) 500 mg tablet; Take one tablet by mouth twice daily for 10 days. Take with food. Do not drink alcohol while on metronidazole.    Other constipation  -     linaCLOtide (LINZESS) 145 mcg capsule; Take one capsule by mouth daily 30 minutes before breakfast.    Alan [name] has mild terminal ileal Crohn's disease he is currently on Stelara.  He seems to be having more trouble with constipation and rectal bleeding lately.  I have recommended adding fiber supplementation and a trial of Linzess 145 mcg once daily as needed for constipation.  I am also going to repeat a course of Cipro and Flagyl to treat small intestinal bacterial overgrowth.  Otherwise Alan [name] will continue his current medications.    Add Fibercon tablets once daily  Trial of Linzess 145 mcg once daily as needed for constipation  Repeat course of Cipro and Flagyl  Continue other GI medications  Return to clinic in 6 months with Alan [name]    If you have questions or concerns that need to be addressed before your next scheduled office visit, please call my nurse at [phone] or send me a MyChart patient message.    A total of 30 minutes were spent today on this encounter.  This includes preparing for the visit, reviewing medical records, obtaining relevant history, speaking to the patient, interpreting test results, ordering medications and tests and documenting the visit in the electronic health record.

## 2022-09-01 ENCOUNTER — Ambulatory Visit: Admit: 2022-09-01 | Discharge: 2022-09-01 | Payer: Commercial Managed Care - HMO

## 2022-09-01 ENCOUNTER — Encounter: Admit: 2022-09-01 | Discharge: 2022-09-01 | Payer: Commercial Managed Care - HMO

## 2022-09-01 DIAGNOSIS — K625 Hemorrhage of anus and rectum: Secondary | ICD-10-CM

## 2022-09-01 DIAGNOSIS — K5900 Constipation, unspecified: Secondary | ICD-10-CM

## 2022-09-02 ENCOUNTER — Encounter: Admit: 2022-09-02 | Discharge: 2022-09-02 | Payer: Commercial Managed Care - HMO

## 2022-09-02 DIAGNOSIS — R12 Heartburn: Secondary | ICD-10-CM

## 2022-09-02 DIAGNOSIS — I10 Essential (primary) hypertension: Secondary | ICD-10-CM

## 2022-09-02 DIAGNOSIS — F99 Mental disorder, not otherwise specified: Secondary | ICD-10-CM

## 2022-09-02 DIAGNOSIS — K5090 Crohn's disease, unspecified, without complications: Secondary | ICD-10-CM

## 2022-09-02 DIAGNOSIS — F32A Depression: Secondary | ICD-10-CM

## 2022-09-02 DIAGNOSIS — E039 Hypothyroidism, unspecified: Secondary | ICD-10-CM

## 2022-09-02 DIAGNOSIS — R519 Generalized headaches: Secondary | ICD-10-CM

## 2022-09-02 DIAGNOSIS — D751 Secondary polycythemia: Secondary | ICD-10-CM

## 2022-09-02 DIAGNOSIS — E785 Hyperlipidemia, unspecified: Secondary | ICD-10-CM

## 2022-09-02 DIAGNOSIS — IMO0002 Ulcer: Secondary | ICD-10-CM

## 2022-09-02 DIAGNOSIS — M2550 Pain in unspecified joint: Secondary | ICD-10-CM

## 2022-09-02 DIAGNOSIS — F419 Anxiety disorder, unspecified: Secondary | ICD-10-CM

## 2022-09-02 DIAGNOSIS — E119 Type 2 diabetes mellitus without complications: Secondary | ICD-10-CM

## 2022-09-02 DIAGNOSIS — K5900 Constipation, unspecified: Secondary | ICD-10-CM

## 2022-09-02 DIAGNOSIS — K219 Gastro-esophageal reflux disease without esophagitis: Secondary | ICD-10-CM

## 2022-09-02 DIAGNOSIS — M797 Fibromyalgia: Secondary | ICD-10-CM

## 2022-09-02 DIAGNOSIS — R110 Nausea: Secondary | ICD-10-CM

## 2022-09-09 ENCOUNTER — Encounter: Admit: 2022-09-09 | Discharge: 2022-09-09 | Payer: Commercial Managed Care - HMO

## 2022-09-10 ENCOUNTER — Encounter: Admit: 2022-09-10 | Discharge: 2022-09-10 | Payer: Commercial Managed Care - HMO

## 2022-09-11 ENCOUNTER — Encounter: Admit: 2022-09-11 | Discharge: 2022-09-11 | Payer: Commercial Managed Care - HMO

## 2022-09-11 ENCOUNTER — Ambulatory Visit: Admit: 2022-09-11 | Discharge: 2022-09-11 | Payer: Commercial Managed Care - HMO

## 2022-09-11 DIAGNOSIS — E039 Hypothyroidism, unspecified: Secondary | ICD-10-CM

## 2022-09-11 DIAGNOSIS — K5090 Crohn's disease, unspecified, without complications: Secondary | ICD-10-CM

## 2022-09-11 DIAGNOSIS — IMO0002 Ulcer: Secondary | ICD-10-CM

## 2022-09-11 DIAGNOSIS — R519 Generalized headaches: Secondary | ICD-10-CM

## 2022-09-11 DIAGNOSIS — F32A Depression: Secondary | ICD-10-CM

## 2022-09-11 DIAGNOSIS — R12 Heartburn: Secondary | ICD-10-CM

## 2022-09-11 DIAGNOSIS — K219 Gastro-esophageal reflux disease without esophagitis: Secondary | ICD-10-CM

## 2022-09-11 DIAGNOSIS — E119 Type 2 diabetes mellitus without complications: Secondary | ICD-10-CM

## 2022-09-11 DIAGNOSIS — F419 Anxiety disorder, unspecified: Secondary | ICD-10-CM

## 2022-09-11 DIAGNOSIS — D751 Secondary polycythemia: Secondary | ICD-10-CM

## 2022-09-11 DIAGNOSIS — E785 Hyperlipidemia, unspecified: Secondary | ICD-10-CM

## 2022-09-11 DIAGNOSIS — M2550 Pain in unspecified joint: Secondary | ICD-10-CM

## 2022-09-11 DIAGNOSIS — I10 Essential (primary) hypertension: Secondary | ICD-10-CM

## 2022-09-11 DIAGNOSIS — K602 Anal fissure, unspecified: Secondary | ICD-10-CM

## 2022-09-11 DIAGNOSIS — R110 Nausea: Secondary | ICD-10-CM

## 2022-09-11 DIAGNOSIS — M797 Fibromyalgia: Secondary | ICD-10-CM

## 2022-09-11 DIAGNOSIS — F99 Mental disorder, not otherwise specified: Secondary | ICD-10-CM

## 2022-09-11 DIAGNOSIS — K5900 Constipation, unspecified: Secondary | ICD-10-CM

## 2022-09-11 MED ORDER — RXAMB NIFEDIPINE 0.3%/LIDOCAINE 1.5%  OINTMENT (BATCHED COMPOUND)
ORAL | 3 refills | 30.00000 days | Status: AC
Start: 2022-09-11 — End: ?
  Filled 2022-09-14: qty 0.18, 20d supply, fill #1

## 2022-09-11 MED ORDER — PROPOFOL 10 MG/ML IV EMUL 50 ML (INFUSION)(AM)(OR)
INTRAVENOUS | 0 refills | Status: DC
Start: 2022-09-11 — End: 2022-09-11

## 2022-09-14 ENCOUNTER — Encounter: Admit: 2022-09-14 | Discharge: 2022-09-14 | Payer: Commercial Managed Care - HMO

## 2022-09-14 DIAGNOSIS — E119 Type 2 diabetes mellitus without complications: Secondary | ICD-10-CM

## 2022-09-14 DIAGNOSIS — K219 Gastro-esophageal reflux disease without esophagitis: Secondary | ICD-10-CM

## 2022-09-14 DIAGNOSIS — R110 Nausea: Secondary | ICD-10-CM

## 2022-09-14 DIAGNOSIS — E785 Hyperlipidemia, unspecified: Secondary | ICD-10-CM

## 2022-09-14 DIAGNOSIS — I10 Essential (primary) hypertension: Secondary | ICD-10-CM

## 2022-09-14 DIAGNOSIS — F99 Mental disorder, not otherwise specified: Secondary | ICD-10-CM

## 2022-09-14 DIAGNOSIS — K5090 Crohn's disease, unspecified, without complications: Secondary | ICD-10-CM

## 2022-09-14 DIAGNOSIS — K5900 Constipation, unspecified: Secondary | ICD-10-CM

## 2022-09-14 DIAGNOSIS — E039 Hypothyroidism, unspecified: Secondary | ICD-10-CM

## 2022-09-14 DIAGNOSIS — M2550 Pain in unspecified joint: Secondary | ICD-10-CM

## 2022-09-14 DIAGNOSIS — R519 Generalized headaches: Secondary | ICD-10-CM

## 2022-09-14 DIAGNOSIS — R12 Heartburn: Secondary | ICD-10-CM

## 2022-09-14 DIAGNOSIS — F32A Depression: Secondary | ICD-10-CM

## 2022-09-14 DIAGNOSIS — M797 Fibromyalgia: Secondary | ICD-10-CM

## 2022-09-14 DIAGNOSIS — F419 Anxiety disorder, unspecified: Secondary | ICD-10-CM

## 2022-09-14 DIAGNOSIS — IMO0002 Ulcer: Secondary | ICD-10-CM

## 2022-09-14 DIAGNOSIS — D751 Secondary polycythemia: Secondary | ICD-10-CM

## 2022-11-11 ENCOUNTER — Encounter: Admit: 2022-11-11 | Discharge: 2022-11-11 | Payer: Commercial Managed Care - HMO

## 2022-11-11 DIAGNOSIS — K5090 Crohn's disease, unspecified, without complications: Secondary | ICD-10-CM

## 2022-11-11 MED ORDER — STELARA 90 MG/ML SC SYRG
90 mg | SUBCUTANEOUS | 3 refills | Status: CN
Start: 2022-11-11 — End: ?

## 2022-11-11 NOTE — Progress Notes
Pharmacy Benefits Investigation    Medication name: STELARA 90 MG/ML SC SYRG    The insurance requires a prior authorization for the medication. The prior authorization renewal was submitted via CoverMyMeds.    Alan [name]  Specialty Pharmacy Patient Advocate

## 2022-11-12 ENCOUNTER — Encounter: Admit: 2022-11-12 | Discharge: 2022-11-12 | Payer: Commercial Managed Care - HMO

## 2022-11-13 ENCOUNTER — Encounter: Admit: 2022-11-13 | Discharge: 2022-11-13 | Payer: Commercial Managed Care - HMO

## 2022-11-13 DIAGNOSIS — K5090 Crohn's disease, unspecified, without complications: Secondary | ICD-10-CM

## 2022-11-13 MED ORDER — STELARA 90 MG/ML SC SYRG
90 mg | SUBCUTANEOUS | 3 refills | 38.50000 days | Status: AC
Start: 2022-11-13 — End: ?

## 2022-11-13 NOTE — Progress Notes
[facility] specialty medication Marcy Panning has been approved but is mandated to [employer] by their insurance. A new prescription has been sent and will be routed to provider for cosignature.    Mychart notification sent.     Mliss Fritz, PHARMD

## 2022-11-13 NOTE — Progress Notes
Pharmacy Benefits Investigation    Medication name: STELARA 69 MG/ML SC SYRG    The prior authorization renewal was reapproved for [employer] (PA number [id number]) from 11/12/2022 through 05/14/2023.    The out of pocket cost is unknown because the medication cannot be filled at [facility]. The patient's insurance mandates they fill the medication at [facility] [place], [place] - [address]    Notified the clinic pharmacist for next steps.    Claudina Lick  Specialty Pharmacy Patient Advocate

## 2023-01-04 ENCOUNTER — Encounter: Admit: 2023-01-04 | Discharge: 2023-01-04 | Payer: Commercial Managed Care - HMO

## 2023-01-05 ENCOUNTER — Encounter: Admit: 2023-01-05 | Discharge: 2023-01-05 | Payer: Commercial Managed Care - HMO

## 2023-01-08 ENCOUNTER — Encounter: Admit: 2023-01-08 | Discharge: 2023-01-08 | Payer: Commercial Managed Care - HMO

## 2023-01-08 DIAGNOSIS — D72829 Elevated white blood cell count, unspecified: Secondary | ICD-10-CM

## 2023-01-08 DIAGNOSIS — K921 Melena: Secondary | ICD-10-CM

## 2023-01-08 DIAGNOSIS — K5090 Crohn's disease, unspecified, without complications: Secondary | ICD-10-CM

## 2023-01-08 LAB — CBC AND DIFF
ABSOLUTE BASO COUNT: 0.1 — ABNORMAL HIGH
ABSOLUTE EOS COUNT: 0.1
ABSOLUTE LYMPH COUNT: 3.7
ABSOLUTE MONO COUNT: 1.3 — ABNORMAL HIGH
ABSOLUTE NEUTROPHIL: 12 — ABNORMAL HIGH
BASOPHILS %: 0.6
EOSINOPHIL %: 1.1
HEMATOCRIT: 48
HEMOGLOBIN: 16
LYMPHOCYTES %: 20
MCH: 29
MCHC: 34
MCV: 86
MONOCYTES %: 7.2
MPV: 8.6 — ABNORMAL LOW
NEUTROPHILS %: 69
PLATELET COUNT: 330
RBC COUNT: 5.6
RDW: 14 — ABNORMAL HIGH
WBC COUNT: 17 — ABNORMAL HIGH

## 2023-01-08 NOTE — Progress Notes
Appointment Notes:    Future Appointments   Date Time Provider Department Center   02/12/2023  9:30 AM Michelene Heady, DO [id number] [place] Ex[facility]   02/17/2023  9:00 AM Hill, Levonne Spiller, APRN-NP [facility]     Referring Provider:  Buckles, Vinnie Level, MD     Contact Summary:  Patient is a 42 year old referred for leukocytosis. Patient saw GI who requested labs be obtained. Patient mentioned history of polcythemia and was seen at [facility] clinic. Other PMH and labs below   Component  Ref Range & Units 01/08/23 08/20/22 12/16/21 1320 06/03/21 2326 12/17/20 2104 12/12/20   White Blood Cells 17.94 High  11.50 High  12.2 High  R 12.2 High  R 13.3 High  R 14.0 High    RBC 5.62 5.51 5.48 R 5.42 R 5.09 R 5.38   Hemoglobin 16.8 16.2 15.9 R 16.5 R 15.0 R 16.5   Hematocrit 48.5 46.8 47.1 R 47.9 R 43.8 R 49.6   MCV 86.3 84.9 86.1 R 88.5 R 86.2 R 92.0   MCH 29.9 29.4 29.1 R 30.4 R 29.5 R 30.7   MCHC 34.6 34.6 33.8 R 34.4 R 34.2 R 33.3   Platelet Count 330 265 256 R 250 R 272 R 290   MPV 8.6 Low  8.9 Low  7.2 R 7.1 R 7.3 R    RDW 14.7 High  14.7 High  15.0 R 14.9 R 15.5 High  R 14.4   Neutrophils 69.7   62 R 64 R    Absolute Neutrophil Count 12.50 High    7.50 High  R 8.50 High  R    Lymphocytes 20.8   24 R 26 R    Absolute Lymph Count 3.74   2.93 R 3.41 R    Monocytes 7.2   9 R 6 R    Absolute Monocyte Count 1.30 High    1.15 High  R 0.77 R    Eosinophil 1.1        Absolute Eosinophil Count 0.19   0.49 High  R 0.59 High  R    Basophils 0.6   1 R 0 R    Absolute Basophil Count 0.10 High    0.09 R 0.03 R    Atypical Lym         Metamyelocyte         Myelocyte         Promyelocyte         Blast         RBC Morph         WBC Morphology         Eosinophils    4 R 4 R    MDW (Monocyte Distribution Width)    24.3 High  R, CM 19.6 R, CM      Past Medical History:   Diagnosis Date    Acid reflux     Anxiety Jan 2022    Constipation All the time    Crohn's disease [facility])     Depression Jan 2022    Essential hypertension 2019    Fibromyalgia     Generalized headaches 2019    Heartburn     Hyperlipidemia     Hypothyroid     Joint pain March 2018    Im guessing    Nausea Multiple times    Polycythemia, secondary     Psychiatric illness     Type II diabetes mellitus (HCC)     Ulcer 2019  Allergies reviewed and verified with the patient, and documented in Epic:  Yes

## 2023-01-11 ENCOUNTER — Encounter: Admit: 2023-01-11 | Discharge: 2023-01-11 | Payer: Commercial Managed Care - HMO

## 2023-01-11 DIAGNOSIS — K21 Gastroesophageal reflux disease with esophagitis without hemorrhage: Secondary | ICD-10-CM

## 2023-01-11 MED ORDER — PANTOPRAZOLE 40 MG PO TBEC
40 mg | ORAL_TABLET | Freq: Two times a day (BID) | ORAL | 3 refills
Start: 2023-01-11 — End: ?

## 2023-01-21 ENCOUNTER — Encounter: Admit: 2023-01-21 | Discharge: 2023-01-21 | Payer: Commercial Managed Care - HMO

## 2023-01-28 ENCOUNTER — Encounter: Admit: 2023-01-28 | Discharge: 2023-01-28 | Payer: Commercial Managed Care - HMO

## 2023-02-11 NOTE — Progress Notes
Name: Alan [name]          MRN: [id number]      DOB: Feb 02, 1981      AGE: 42 y.o.   DATE OF SERVICE: 02/12/2023    Subjective:             Reason for Visit:  Heme/Onc Care      Duffy Dantonio is a 42 y.o. male who presents to my hematology clinic upon referral by his GI specialist for his leukocytosis.    Hematology history:  Leukocytosis  Referring Provider:  Buckles, Vinnie Level, MD      Contact Summary:  Patient is a 42 year old referred for leukocytosis. Patient saw GI who requested labs be obtained. Patient mentioned history of polcythemia and was seen at [facility] clinic. Other PMH and labs below   Component  Ref Range & Units 01/08/23 08/20/22 12/16/21 1320 06/03/21 2326 12/17/20 2104 12/12/20   White Blood Cells 17.94 High  11.50 High  12.2 High  R 12.2 High  R 13.3 High  R 14.0 High    RBC 5.62 5.51 5.48 R 5.42 R 5.09 R 5.38   Hemoglobin 16.8 16.2 15.9 R 16.5 R 15.0 R 16.5   Hematocrit 48.5 46.8 47.1 R 47.9 R 43.8 R 49.6   MCV 86.3 84.9 86.1 R 88.5 R 86.2 R 92.0   MCH 29.9 29.4 29.1 R 30.4 R 29.5 R 30.7   MCHC 34.6 34.6 33.8 R 34.4 R 34.2 R 33.3   Platelet Count 330 265 256 R 250 R 272 R 290   MPV 8.6 Low  8.9 Low  7.2 R 7.1 R 7.3 R     RDW 14.7 High  14.7 High  15.0 R 14.9 R 15.5 High  R 14.4   Neutrophils 69.7     62 R 64 R     Absolute Neutrophil Count 12.50 High      7.50 High  R 8.50 High  R     Lymphocytes 20.8     24 R 26 R     Absolute Lymph Count 3.74     2.93 R 3.41 R     Monocytes 7.2     9 R 6 R     Absolute Monocyte Count 1.30 High      1.15 High  R 0.77 R     Eosinophil 1.1             Absolute Eosinophil Count 0.19     0.49 High  R 0.59 High  R     Basophils 0.6     1 R 0 R     Absolute Basophil Count 0.10 High      0.09 R 0.03 R     Atypical Lym               Metamyelocyte               Myelocyte               Promyelocyte               Blast               RBC Morph               WBC Morphology               Eosinophils       4 R 4 R     MDW (Monocyte Distrib  History of Present Illness  Past Medical History:    Past Medical History:   Diagnosis Date    Accidental fall     Acid reflux     Allergy In file    Anxiety Jan 2022    Constipation All the time    Crohn's disease [facility])     Depression Jan 2022    Depressive disorder, not elsewhere classified 06/30/2019    Essential hypertension 2019    Fibromyalgia     Generalized headaches 2019    Heartburn     Hyperlipidemia     Hypothyroid     Joint pain March 2018    Im guessing    Nausea Multiple times    Polycythemia, secondary     Psychiatric illness     Stomach disorder     Type II diabetes mellitus (HCC)     Ulcer 2019    Vision decreased        Past Surgical History:   Surgical History:   Procedure Laterality Date    HX CHOLECYSTECTOMY  05/2019    Colonoscopy N/A 11/13/2019    Performed by Buckles, Vinnie Level, MD at [facility] OR    ESOPHAGOGASTRODUODENOSCOPY WITH BIOPSY - FLEXIBLE N/A 11/13/2019    Performed by Buckles, Vinnie Level, MD at [facility] OR    Colonoscopy N/A 12/31/2020    Performed by Buckles, Vinnie Level, MD at [facility] OR    ESOPHAGOGASTRODUODENOSCOPY WITH BIOPSY - FLEXIBLE N/A 12/31/2020    Performed by Buckles, Vinnie Level, MD at [facility] OR    BREATH HYDROGEN/ METHANE TESTING - LACTULOSE N/A 03/16/2022    Performed by Tommie Sams, MD at [facility] ENDO    ESOPHAGEAL MOTILITY STUDY N/A 03/16/2022    Performed by Tommie Sams, MD at [facility] ENDO    ESOPHAGOGASTRODUODENOSCOPY WITH DILATION GASTRIC/ DUODENAL STRICTURE - FLEXIBLE N/A 03/18/2022    Performed by Buckles, Vinnie Level, MD at [facility] ENDO    COLONOSCOPY DIAGNOSTIC WITH SPECIMEN COLLECTION BY BRUSHING/ WASHING - FLEXIBLE N/A 03/18/2022    Performed by Buckles, Vinnie Level, MD at [facility] ENDO    ESOPHAGOGASTRODUODENOSCOPY WITH BIOPSY - FLEXIBLE N/A 03/18/2022    Performed by Buckles, Vinnie Level, MD at [facility] ENDO    COLONOSCOPY WITH BIOPSY - FLEXIBLE  03/18/2022    Performed by Buckles, Vinnie Level, MD at [facility] ENDO    SIGMOIDOSCOPY DIAGNOSTIC WITH COLLECTION SPECIMEN BY BRUSHING/ WASHING - FLEXIBLE N/A 09/11/2022 Performed by Buckles, Vinnie Level, MD at [id number] OR    COLONOSCOPY      HX SHOULDER SURGERY      HX VASECTOMY         Transfusion History: No    Medication list:   Current Outpatient Medications:     ALPRAZolam (XANAX) 1 mg tablet, Take one tablet by mouth as Needed., Disp: , Rfl:     BD ULTRA-FINE SHORT PEN NEEDLE 31 gauge x 5/16 pen needle, USE TO INJECT INSULIN 4 TIMES A DAY, Disp: , Rfl:     cetirizine (ZYRTEC) 10 mg tablet, Daily, Disp: , Rfl:     chlordiazePOXIDE-clidinium (LIBRAX (WITH CLIDINIUM)) 2.5/5 mg capsule, TAKE 1 CAPSULE BY MOUTH THREE TIMES A DAY, Disp: 270 capsule, Rfl: 2    clonazePAM (KLONOPIN) 1 mg tablet, TAKE 1 TABLET BY MOUTH TWICE A DAY AS NEEDED FOR ANXIETY, Disp: , Rfl:     diazePAM (VALIUM) 10 mg tablet, Take one tablet by mouth twice daily., Disp: , Rfl:     dicyclomine (  BENTYL) 20 mg tablet, TAKE ONE TABLET BY MOUTH FOUR TIMES DAILY AS NEEDED, Disp: 360 tablet, Rfl: 1    diphenhydrAMINE (BENADRYL) 25 mg capsule, 1 Cap, QID, PO, PRN, Allergy, Disp: , Rfl:     diphenoxylate-atropine (LOMOTIL) 2.5-0.025 mg tablet, TAKE 1 TABLET BY MOUTH FOUR TIMES A DAY AS NEEDED FOR DIARRHEA, Disp: 60 tablet, Rfl: 0    ERGOcalciferoL (vitamin D2) (DRISDOL) 1,250 mcg (50,000 unit) capsule, TAKE 1 CAPSULE BY MOUTH EVERY 7 DAYS FOR VITAMIN D DEFICIENCY, Disp: 12 capsule, Rfl: 0    famotidine (PEPCID) 20 mg tablet, Take one tablet by mouth daily., Disp: , Rfl:     FARXIGA 10 mg tablet, Take one tablet by mouth every morning., Disp: , Rfl:     gabapentin (NEURONTIN) 300 mg capsule, Take one capsule by mouth three times daily., Disp: , Rfl:     HUMALOG KWIKPEN INSULIN 100 unit/mL subcutaneous PEN, Inject four units under the skin three times daily with meals. Correct for blood sugar over 220. BG 221-260: give 2 units. BG 261-300 give 4 units. BG 301-350: give 6 units. 351-400: 8 units. >400: 10 units, Disp: 15 mL, Rfl: 0    hydrOXYzine HCL (ATARAX) 25 mg tablet, Take one tablet by mouth every 6 hours as needed. Indications: anxious, Disp: 30 tablet, Rfl: 0    L-Methylfolate (DEPLIN 15) 15 mg tablet, Take one tablet by mouth daily., Disp: , Rfl:     levothyroxine (SYNTHROID) 25 mcg tablet, Take one tablet by mouth daily 30 minutes before breakfast., Disp: , Rfl:     linaCLOtide (LINZESS) 145 mcg capsule, Take one capsule by mouth daily 30 minutes before breakfast., Disp: 30 capsule, Rfl: 11    lipase-protease-amylase (PANCREAZE) 37,000-97,300- 149,900 unit capsule, Take one capsule by mouth three times daily. Please use these instructions: Take two capsules WITH each meal, and one capsule with each snack that contains fat or protein. May have three snacks daily., Disp: 270 capsule, Rfl: 3    NIFEDIPINE/LIDOCAINE 0.3%/1.5% IN PETROLATUM OINTMENT (BATCHED COMPOUND), Apply to anal area three times daily as needed, Disp: 45 g, Rfl: 3    nortriptyline (PAMELOR) 50 mg capsule, Take one capsule by mouth at bedtime daily., Disp: 30 capsule, Rfl: 0    ONETOUCH DELICA PLUS LANCET 33 gauge, USE TO TEST BLOOD SUGAR 4 TIMES PER DAY., Disp: , Rfl:     pantoprazole DR (PROTONIX) 40 mg tablet, Take one tablet by mouth twice daily., Disp: 180 tablet, Rfl: 3    pramoxine-hydrocortisone (ANALPRAM-HC) 1-1 % rectal cream, Insert or Apply one g to rectal area as directed twice daily., Disp: 30 g, Rfl: 1    QUEtiapine XR (SEROQUEL XR) 150 mg tablet, Take one tablet by mouth at bedtime daily., Disp: , Rfl:     rosuvastatin (CRESTOR) 40 mg tablet, Take one tablet by mouth daily., Disp: , Rfl:     TRESIBA FLEXTOUCH U-200 200 unit/mL (3 mL) injection PEN, INJECT 50 UNITS SUBCUTANEOUSLY DAILY, Disp: , Rfl:     ustekinumab (STELARA) 90 mg syringe, Inject 1 mL under the skin every 28 days., Disp: 1 mL, Rfl: 3    VASCEPA 1 gram capsule, TAKE 2 CAPSULES BY MOUTH TWICE A DAY, Disp: , Rfl:     Allergy List:   Allergies   Allergen Reactions    Iodinated Contrast Media FLUSHING (SKIN) and REDNESS    Iodine And Iodide Containing Products RASH    Azathioprine RASH    Chlorhexidine RASH    Chlorphen-Phenyleph-Hydrocodon  ITCHING    Gadolinium-Containing Contrast Media FLUSHING (SKIN)     Pt reported has had reaction in past x2 at other location. Pt reports was given benadryl with 2nd reaction.       Oxycodone ITCHING       Social History:   Social History     Socioeconomic History    Marital status: Married   Tobacco Use    Smoking status: Every Day     Current packs/day: 0.50     Average packs/day: 0.9 packs/day for 25.0 years (22.5 ttl pk-yrs)     Types: Cigarettes    Smokeless tobacco: Former     Types: Snuff, Chew     Quit date: 06/29/1997   Vaping Use    Vaping status: Never Used   Substance and Sexual Activity    Alcohol use: Not Currently     Comment: Havent had a drink in three years    Drug use: Yes     Types: Marijuana    Sexual activity: Yes     Partners: Female     Birth control/protection: None       Family History:   Family History   Problem Relation Name Age of Onset    None Reported Mother      Cancer-Lung Maternal Uncle Uncle     Cancer Paternal Uncle      Cancer-Prostate Paternal Carloyn Jaeger     Cancer Maternal Grandmother Both grandfathers     Alcohol liver disease Maternal Grandfather Grandpa     Cancer Maternal Grandfather Grandpa     Cancer Paternal Grandfather All men on [place] side of family             Review of Systems   Constitutional:  Positive for diaphoresis (for years) and fatigue. Negative for chills and fever.   HENT:  Negative for congestion.    Eyes:  Negative for visual disturbance.   Respiratory:  Positive for shortness of breath (with exertion). Negative for cough.    Cardiovascular:  Negative for chest pain.   Gastrointestinal:  Positive for constipation and diarrhea.   Genitourinary:  Negative for difficulty urinating.   Musculoskeletal:  Positive for myalgias (fibromyalgia). Negative for arthralgias and back pain.   Skin:  Negative for rash.   Neurological:  Negative for headaches.   Hematological:  Negative for adenopathy. Does not bruise/bleed easily.   Psychiatric/Behavioral: Negative.           Objective:          ALPRAZolam (XANAX) 1 mg tablet Take one tablet by mouth as Needed.    BD ULTRA-FINE SHORT PEN NEEDLE 31 gauge x 5/16 pen needle USE TO INJECT INSULIN 4 TIMES A DAY    cetirizine (ZYRTEC) 10 mg tablet Daily    chlordiazePOXIDE-clidinium (LIBRAX (WITH CLIDINIUM)) 2.5/5 mg capsule TAKE 1 CAPSULE BY MOUTH THREE TIMES A DAY    clonazePAM (KLONOPIN) 1 mg tablet TAKE 1 TABLET BY MOUTH TWICE A DAY AS NEEDED FOR ANXIETY    diazePAM (VALIUM) 10 mg tablet Take one tablet by mouth twice daily.    dicyclomine (BENTYL) 20 mg tablet TAKE ONE TABLET BY MOUTH FOUR TIMES DAILY AS NEEDED    diphenhydrAMINE (BENADRYL) 25 mg capsule 1 Cap, QID, PO, PRN, Allergy    diphenoxylate-atropine (LOMOTIL) 2.5-0.025 mg tablet TAKE 1 TABLET BY MOUTH FOUR TIMES A DAY AS NEEDED FOR DIARRHEA    ERGOcalciferoL (vitamin D2) (DRISDOL) 1,250 mcg (50,000 unit) capsule TAKE 1 CAPSULE BY MOUTH EVERY 7  DAYS FOR VITAMIN D DEFICIENCY    famotidine (PEPCID) 20 mg tablet Take one tablet by mouth daily.    FARXIGA 10 mg tablet Take one tablet by mouth every morning.    gabapentin (NEURONTIN) 300 mg capsule Take one capsule by mouth three times daily.    HUMALOG KWIKPEN INSULIN 100 unit/mL subcutaneous PEN Inject four units under the skin three times daily with meals. Correct for blood sugar over 220. BG 221-260: give 2 units. BG 261-300 give 4 units. BG 301-350: give 6 units. 351-400: 8 units. >400: 10 units    hydrOXYzine HCL (ATARAX) 25 mg tablet Take one tablet by mouth every 6 hours as needed. Indications: anxious    L-Methylfolate (DEPLIN 15) 15 mg tablet Take one tablet by mouth daily.    levothyroxine (SYNTHROID) 25 mcg tablet Take one tablet by mouth daily 30 minutes before breakfast.    linaCLOtide (LINZESS) 145 mcg capsule Take one capsule by mouth daily 30 minutes before breakfast.    lipase-protease-amylase (PANCREAZE) 37,000-97,300- 149,900 unit capsule Take one capsule by mouth three times daily. Please use these instructions: Take two capsules WITH each meal, and one capsule with each snack that contains fat or protein. May have three snacks daily.    NIFEDIPINE/LIDOCAINE 0.3%/1.5% IN PETROLATUM OINTMENT (BATCHED COMPOUND) Apply to anal area three times daily as needed    nortriptyline (PAMELOR) 50 mg capsule Take one capsule by mouth at bedtime daily.    ONETOUCH DELICA PLUS LANCET 33 gauge USE TO TEST BLOOD SUGAR 4 TIMES PER DAY.    pantoprazole DR (PROTONIX) 40 mg tablet Take one tablet by mouth twice daily.    pramoxine-hydrocortisone (ANALPRAM-HC) 1-1 % rectal cream Insert or Apply one g to rectal area as directed twice daily.    QUEtiapine XR (SEROQUEL XR) 150 mg tablet Take one tablet by mouth at bedtime daily.    rosuvastatin (CRESTOR) 40 mg tablet Take one tablet by mouth daily.    TRESIBA FLEXTOUCH U-200 200 unit/mL (3 mL) injection PEN INJECT 50 UNITS SUBCUTANEOUSLY DAILY    ustekinumab (STELARA) 90 mg syringe Inject 1 mL under the skin every 28 days.    VASCEPA 1 gram capsule TAKE 2 CAPSULES BY MOUTH TWICE A DAY     Vitals:    02/12/23 0939   PainSc: Five     There is no height or weight on file to calculate BMI.     Pain Score: Five  Pain Loc: Generalized    Fatigue Scale: 5    Pain Addressed:  N/A    Patient Evaluated for a Clinical Trial: No treatment clinical trial available for this patient.     [place] Cooperative Oncology Group performance status is 0, Fully active, able to carry on all pre-disease performance without restriction.Marland Kitchen     Physical Exam          Assessment and Plan:  1. Leukocytosis- I will check a CBC with differential to recheck counts.  I will check a sed rate to rule out inflammation as an etiology.  I will check a flow cytometry and peripheral smear.  I will check a BCR ABL qualitative to rule out CML.  I will consider checking CT chest, abdomen, and pelvis to look for potential malignancies/infection. I will consider a bone marrow biopsy in the future.  2. Return to clinic- I will have the patient return to clinic in three weeks.    Problem   Leukocytosis

## 2023-02-12 ENCOUNTER — Encounter: Admit: 2023-02-12 | Discharge: 2023-02-12 | Payer: Private Health Insurance - Indemnity

## 2023-02-12 DIAGNOSIS — E785 Hyperlipidemia, unspecified: Secondary | ICD-10-CM

## 2023-02-12 DIAGNOSIS — M797 Fibromyalgia: Secondary | ICD-10-CM

## 2023-02-12 DIAGNOSIS — F32A Depression: Secondary | ICD-10-CM

## 2023-02-12 DIAGNOSIS — D751 Secondary polycythemia: Secondary | ICD-10-CM

## 2023-02-12 DIAGNOSIS — H547 Unspecified visual loss: Secondary | ICD-10-CM

## 2023-02-12 DIAGNOSIS — R12 Heartburn: Secondary | ICD-10-CM

## 2023-02-12 DIAGNOSIS — R519 Generalized headaches: Secondary | ICD-10-CM

## 2023-02-12 DIAGNOSIS — M2550 Pain in unspecified joint: Secondary | ICD-10-CM

## 2023-02-12 DIAGNOSIS — K5090 Crohn's disease, unspecified, without complications: Secondary | ICD-10-CM

## 2023-02-12 DIAGNOSIS — K219 Gastro-esophageal reflux disease without esophagitis: Secondary | ICD-10-CM

## 2023-02-12 DIAGNOSIS — F99 Mental disorder, not otherwise specified: Secondary | ICD-10-CM

## 2023-02-12 DIAGNOSIS — F3289 Other specified depressive episodes: Secondary | ICD-10-CM

## 2023-02-12 DIAGNOSIS — E119 Type 2 diabetes mellitus without complications: Secondary | ICD-10-CM

## 2023-02-12 DIAGNOSIS — W19XXXA Unspecified fall, initial encounter: Secondary | ICD-10-CM

## 2023-02-12 DIAGNOSIS — K319 Disease of stomach and duodenum, unspecified: Secondary | ICD-10-CM

## 2023-02-12 DIAGNOSIS — F419 Anxiety disorder, unspecified: Secondary | ICD-10-CM

## 2023-02-12 DIAGNOSIS — R110 Nausea: Secondary | ICD-10-CM

## 2023-02-12 DIAGNOSIS — D72829 Elevated white blood cell count, unspecified: Secondary | ICD-10-CM

## 2023-02-12 DIAGNOSIS — K5900 Constipation, unspecified: Secondary | ICD-10-CM

## 2023-02-12 DIAGNOSIS — E039 Hypothyroidism, unspecified: Secondary | ICD-10-CM

## 2023-02-12 DIAGNOSIS — IMO0002 Ulcer: Secondary | ICD-10-CM

## 2023-02-12 DIAGNOSIS — I10 Essential (primary) hypertension: Secondary | ICD-10-CM

## 2023-02-12 DIAGNOSIS — T7840XA Allergy, unspecified, initial encounter: Secondary | ICD-10-CM

## 2023-02-17 ENCOUNTER — Ambulatory Visit: Admit: 2023-02-17 | Discharge: 2023-02-18 | Payer: Private Health Insurance - Indemnity

## 2023-02-17 ENCOUNTER — Encounter: Admit: 2023-02-17 | Discharge: 2023-02-17 | Payer: Private Health Insurance - Indemnity

## 2023-02-17 DIAGNOSIS — E559 Vitamin D deficiency, unspecified: Secondary | ICD-10-CM

## 2023-02-17 DIAGNOSIS — K5900 Constipation, unspecified: Secondary | ICD-10-CM

## 2023-02-17 DIAGNOSIS — K5090 Crohn's disease, unspecified, without complications: Secondary | ICD-10-CM

## 2023-02-17 DIAGNOSIS — K921 Melena: Secondary | ICD-10-CM

## 2023-02-17 DIAGNOSIS — E785 Hyperlipidemia, unspecified: Secondary | ICD-10-CM

## 2023-02-17 DIAGNOSIS — K219 Gastro-esophageal reflux disease without esophagitis: Secondary | ICD-10-CM

## 2023-02-17 DIAGNOSIS — M2550 Pain in unspecified joint: Secondary | ICD-10-CM

## 2023-02-17 DIAGNOSIS — IMO0002 Ulcer: Secondary | ICD-10-CM

## 2023-02-17 DIAGNOSIS — D751 Secondary polycythemia: Secondary | ICD-10-CM

## 2023-02-17 DIAGNOSIS — F419 Anxiety disorder, unspecified: Secondary | ICD-10-CM

## 2023-02-17 DIAGNOSIS — D72829 Elevated white blood cell count, unspecified: Secondary | ICD-10-CM

## 2023-02-17 DIAGNOSIS — F3289 Other specified depressive episodes: Secondary | ICD-10-CM

## 2023-02-17 DIAGNOSIS — Z79899 Other long term (current) drug therapy: Secondary | ICD-10-CM

## 2023-02-17 DIAGNOSIS — E119 Type 2 diabetes mellitus without complications: Secondary | ICD-10-CM

## 2023-02-17 DIAGNOSIS — T7840XA Allergy, unspecified, initial encounter: Secondary | ICD-10-CM

## 2023-02-17 DIAGNOSIS — K319 Disease of stomach and duodenum, unspecified: Secondary | ICD-10-CM

## 2023-02-17 DIAGNOSIS — F99 Mental disorder, not otherwise specified: Secondary | ICD-10-CM

## 2023-02-17 DIAGNOSIS — K21 Gastroesophageal reflux disease with esophagitis without hemorrhage: Secondary | ICD-10-CM

## 2023-02-17 DIAGNOSIS — W19XXXA Unspecified fall, initial encounter: Secondary | ICD-10-CM

## 2023-02-17 DIAGNOSIS — R519 Generalized headaches: Secondary | ICD-10-CM

## 2023-02-17 DIAGNOSIS — R110 Nausea: Secondary | ICD-10-CM

## 2023-02-17 DIAGNOSIS — M797 Fibromyalgia: Secondary | ICD-10-CM

## 2023-02-17 DIAGNOSIS — H547 Unspecified visual loss: Secondary | ICD-10-CM

## 2023-02-17 DIAGNOSIS — I10 Essential (primary) hypertension: Secondary | ICD-10-CM

## 2023-02-17 DIAGNOSIS — R12 Heartburn: Secondary | ICD-10-CM

## 2023-02-17 DIAGNOSIS — E039 Hypothyroidism, unspecified: Secondary | ICD-10-CM

## 2023-02-17 DIAGNOSIS — F32A Depression: Secondary | ICD-10-CM

## 2023-02-17 MED ORDER — PEG-ELECTROLYTE SOLN 420 GRAM PO SOLR
0 refills | Status: AC
Start: 2023-02-17 — End: ?

## 2023-02-17 MED ORDER — LINZESS 72 MCG PO CAP
72 ug | ORAL_CAPSULE | Freq: Every day | ORAL | 1 refills | 30.00000 days | Status: AC
Start: 2023-02-17 — End: ?

## 2023-02-17 NOTE — Telephone Encounter
02/17/2023 11:23 AM  Lab orders faxed to [facility] to fax # [phone] .

## 2023-02-17 NOTE — Progress Notes
Telehealth Visit Note    Date of Service: 02/17/2023    Subjective:           Alan [name] is a 42 y.o. male.    History of Present Illness     The patient is a 42 year old individual with a history of Crohn's disease, currently on Stelara every four weeks. The patient's last endoscopy and colonoscopy in September 2023 revealed mild esophageal stenosis, small erosions in the terminal ileum, and external hemorrhoids. The patient has been struggling with constipation and intermittent rectal bleeding, which worsens with hard stools. The patient has been on antibiotics in the past for bloating and abdominal pain, which provided some relief.    The patient reports no significant change in his condition since the last visit in February. He describes having good and bad weeks. However, the patient continues to experience episodes of constipation every week or every other week. The patient has been taking milk of magnesia nightly and Dulcolax as needed. The patient also tried Linzess, which resulted in significant bloating and inability to defecate, followed by a day of frequent bowel movements. The patient reports significant discomfort, particularly on the right side, during these episodes.    The patient also has a history of an anal fissure and has been prescribed nifedipine ointment, which he reports has not significantly changed his symptoms. The patient has been on pantoprazole twice daily for reflux, which he describes as coming and going. The patient has also been taking pancreatic enzymes with each meal, but is unsure if this is contributing to his symptoms. The patient has a history of a low vitamin D level and is currently taking a supplement once a week. The patient is also on Stelara for his Crohn's disease, but reports occasional delays in receiving his medication.                   Objective:          ALPRAZolam (XANAX) 1 mg tablet Take one tablet by mouth as Needed.    BD ULTRA-FINE SHORT PEN NEEDLE 31 gauge x 5/16 pen needle USE TO INJECT INSULIN 4 TIMES A DAY    cetirizine (ZYRTEC) 10 mg tablet Daily    chlordiazePOXIDE-clidinium (LIBRAX (WITH CLIDINIUM)) 2.5/5 mg capsule TAKE 1 CAPSULE BY MOUTH THREE TIMES A DAY    clonazePAM (KLONOPIN) 1 mg tablet TAKE 1 TABLET BY MOUTH TWICE A DAY AS NEEDED FOR ANXIETY    diazePAM (VALIUM) 10 mg tablet Take one tablet by mouth twice daily.    dicyclomine (BENTYL) 20 mg tablet TAKE ONE TABLET BY MOUTH FOUR TIMES DAILY AS NEEDED    diphenhydrAMINE (BENADRYL) 25 mg capsule 1 Cap, QID, PO, PRN, Allergy    diphenoxylate-atropine (LOMOTIL) 2.5-0.025 mg tablet TAKE 1 TABLET BY MOUTH FOUR TIMES A DAY AS NEEDED FOR DIARRHEA    ERGOcalciferoL (vitamin D2) (DRISDOL) 1,250 mcg (50,000 unit) capsule TAKE 1 CAPSULE BY MOUTH EVERY 7 DAYS FOR VITAMIN D DEFICIENCY    famotidine (PEPCID) 20 mg tablet Take one tablet by mouth daily.    FARXIGA 10 mg tablet Take one tablet by mouth every morning.    gabapentin (NEURONTIN) 300 mg capsule Take one capsule by mouth three times daily.    HUMALOG KWIKPEN INSULIN 100 unit/mL subcutaneous PEN Inject four units under the skin three times daily with meals. Correct for blood sugar over 220. BG 221-260: give 2 units. BG 261-300 give 4 units. BG 301-350: give 6 units. 351-400: 8 units. >400:  10 units    hydrOXYzine HCL (ATARAX) 25 mg tablet Take one tablet by mouth every 6 hours as needed. Indications: anxious    L-Methylfolate (DEPLIN 15) 15 mg tablet Take one tablet by mouth daily.    levothyroxine (SYNTHROID) 25 mcg tablet Take one tablet by mouth daily 30 minutes before breakfast.    linaCLOtide (LINZESS) 72 mcg capsule Take one capsule by mouth daily. Indications: chronic idiopathic constipation    lipase-protease-amylase (PANCREAZE) 37,000-97,300- 149,900 unit capsule Take one capsule by mouth three times daily. Please use these instructions: Take two capsules WITH each meal, and one capsule with each snack that contains fat or protein. May have three snacks daily.    NIFEDIPINE/LIDOCAINE 0.3%/1.5% IN PETROLATUM OINTMENT (BATCHED COMPOUND) Apply to anal area three times daily as needed    nortriptyline (PAMELOR) 50 mg capsule Take one capsule by mouth at bedtime daily.    ONETOUCH DELICA PLUS LANCET 33 gauge USE TO TEST BLOOD SUGAR 4 TIMES PER DAY.    pantoprazole DR (PROTONIX) 40 mg tablet Take one tablet by mouth twice daily.    peg-electrolyte solution (NULYTELY LEMON-LIME) 420 gram oral solution FOR BOWEL CLEANSE: Add water to fill line. Shake until dissolved. May taste better if refrigerated until cold. Drink half of the jug throughout the day. You may still eat during this process. If ineffective, drink the other half of the jug the following day.    pramoxine-hydrocortisone (ANALPRAM-HC) 1-1 % rectal cream Insert or Apply one g to rectal area as directed twice daily.    QUEtiapine XR (SEROQUEL XR) 150 mg tablet Take one tablet by mouth at bedtime daily.    rosuvastatin (CRESTOR) 40 mg tablet Take one tablet by mouth daily.    TRESIBA FLEXTOUCH U-200 200 unit/mL (3 mL) injection PEN INJECT 50 UNITS SUBCUTANEOUSLY DAILY    ustekinumab (STELARA) 90 mg syringe Inject 1 mL under the skin every 28 days.    VASCEPA 1 gram capsule TAKE 2 CAPSULES BY MOUTH TWICE A DAY          Telehealth Patient Reported Vitals       Row Name 02/17/23 0847                Pain Score Five        Pain Loc ABDOMEN                      Telehealth Body Mass Index: [phone] at 02/17/2023  2:01 PM    Physical Exam  Limited exam due to nature of telemedicine    No acute distress  No focal deficits  Alert and oriented x 3  Normal affect        Assessment and Plan:  Diagnoses and all orders for this visit:    Crohn's disease without complication, unspecified gastrointestinal tract location (HCC)  -     linaCLOtide (LINZESS) 72 mcg capsule; Take one capsule by mouth daily. Indications: chronic idiopathic constipation  -     25-OH VITAMIN D (D2 + D3); Future; Expected date: 02/17/2023  -     VITAMIN B12; Future; Expected date: 02/17/2023  -     C REACTIVE PROTEIN (CRP); Future; Expected date: 02/17/2023  -     IRON + BINDING CAPACITY + %SAT+ FERRITIN; Future; Expected date: 02/17/2023  -     FOLATE, SERUM; Future; Expected date: 02/17/2023    Leukocytosis, unspecified type  -     25-OH VITAMIN D (D2 + D3); Future; Expected  date: 02/17/2023  -     VITAMIN B12; Future; Expected date: 02/17/2023  -     C REACTIVE PROTEIN (CRP); Future; Expected date: 02/17/2023  -     IRON + BINDING CAPACITY + %SAT+ FERRITIN; Future; Expected date: 02/17/2023  -     FOLATE, SERUM; Future; Expected date: 02/17/2023    Vitamin D deficiency  -     25-OH VITAMIN D (D2 + D3); Future; Expected date: 02/17/2023  -     IRON + BINDING CAPACITY + %SAT+ FERRITIN; Future; Expected date: 02/17/2023    Blood in stool    Constipation, unspecified constipation type    Gastroesophageal reflux disease with esophagitis without hemorrhage    Medication management    Other orders  -     peg-electrolyte solution (NULYTELY LEMON-LIME) 420 gram oral solution; FOR BOWEL CLEANSE: Add water to fill line. Shake until dissolved. May taste better if refrigerated until cold. Drink half of the jug throughout the day. You may still eat during this process. If ineffective, drink the other half of the jug the following day.             Crohn's Disease  Stable on Stelara every 4 weeks. Last colonoscopy in September 2023 showed small erosions in terminal ileum. CT abdomen/pelvis in November 2023 negative for acute findings.  -Continue Stelara as prescribed.    Chronic Constipation  Persistent despite use of Milk of Magnesia and Dulcolax. Trial of Linzess resulted in significant discomfort.  -Perform Miralax bowel cleanse.  -Start Linzess 72 mcg every other day, 30 minutes before eating.  -Reevaluate after one week for possible increase to daily dosing.    Anal Fissure  Persistent pain and bleeding. Current treatment with nifedipine ointment not providing significant relief.  -Continue diligent use of nifedipine ointment after each bowel movement for the next 6 weeks.    Gastroesophageal Reflux Disease  Intermittent symptoms despite twice daily Pantoprazole. Pepcid available for flare-ups.  -Continue Pantoprazole twice daily.  -Use Pepcid as needed for flare-ups.    Vitamin D Deficiency  On weekly supplementation.  -Order labs to check current Vitamin D level.    Follow-up  -Check labs for Vitamin D level and white blood cell count.  -Consider referral to colorectal specialist if constipation and anal fissure symptoms do not improve with current plan.  -Discuss timing for repeat colonoscopy or Flexig.          A total of 30 minutes were spent today on this encounter.  This includes preparing for the visit, reviewing medical records, obtaining relevant history, speaking to the patient, interpreting test results, ordering medications and tests and documenting the visit in the electronic health record.

## 2023-02-18 ENCOUNTER — Encounter: Admit: 2023-02-18 | Discharge: 2023-02-18 | Payer: Private Health Insurance - Indemnity

## 2023-02-18 DIAGNOSIS — K5090 Crohn's disease, unspecified, without complications: Secondary | ICD-10-CM

## 2023-02-18 MED ORDER — STELARA 90 MG/ML SC SYRG
SUBCUTANEOUS | 3 refills | 38.50000 days | Status: AC
Start: 2023-02-18 — End: ?

## 2023-02-18 NOTE — Telephone Encounter
Refill request received for:     STELARA 90 mg/mL syringe     Sig: INJECT 1 SYRINGE SUBCUTANEOUSLY  EVERY 4 WEEKS     Last OV: 02/17/23  Last fill: 01/29/23    Routing to Dr. Janene Madeira for approval/refusal

## 2023-03-02 ENCOUNTER — Encounter: Admit: 2023-03-02 | Discharge: 2023-03-02 | Payer: Private Health Insurance - Indemnity

## 2023-03-02 DIAGNOSIS — D72829 Elevated white blood cell count, unspecified: Secondary | ICD-10-CM

## 2023-03-03 ENCOUNTER — Encounter: Admit: 2023-03-03 | Discharge: 2023-03-03 | Payer: Private Health Insurance - Indemnity

## 2023-03-03 DIAGNOSIS — D72829 Elevated white blood cell count, unspecified: Secondary | ICD-10-CM

## 2023-03-04 ENCOUNTER — Encounter: Admit: 2023-03-04 | Discharge: 2023-03-04 | Payer: Private Health Insurance - Indemnity

## 2023-03-12 ENCOUNTER — Encounter: Admit: 2023-03-12 | Discharge: 2023-03-12 | Payer: Private Health Insurance - Indemnity

## 2023-03-12 NOTE — Telephone Encounter
03/12/2023 3:03 PM

## 2023-03-16 ENCOUNTER — Encounter: Admit: 2023-03-16 | Discharge: 2023-03-16 | Payer: Private Health Insurance - Indemnity

## 2023-03-17 ENCOUNTER — Encounter: Admit: 2023-03-17 | Discharge: 2023-03-17 | Payer: Private Health Insurance - Indemnity

## 2023-03-25 ENCOUNTER — Encounter: Admit: 2023-03-25 | Discharge: 2023-03-25 | Payer: Private Health Insurance - Indemnity

## 2023-03-25 NOTE — Telephone Encounter
03/25/2023 10:09 AM   Received prior authorization request for Trulance. Prior authorization initiated through CoverMyMeds. Unable to complete PA as patient needs to have tried and failed lubiprostone before insurance will consider covering Trulance. Will reach out to [facility] for further recommendations.

## 2023-03-29 ENCOUNTER — Encounter: Admit: 2023-03-29 | Discharge: 2023-03-29 | Payer: Private Health Insurance - Indemnity

## 2023-04-09 ENCOUNTER — Encounter: Admit: 2023-04-09 | Discharge: 2023-04-09 | Payer: Private Health Insurance - Indemnity

## 2023-04-13 ENCOUNTER — Encounter: Admit: 2023-04-13 | Discharge: 2023-04-13 | Payer: Private Health Insurance - Indemnity

## 2023-04-13 DIAGNOSIS — W19XXXA Unspecified fall, initial encounter: Secondary | ICD-10-CM

## 2023-04-13 DIAGNOSIS — D751 Secondary polycythemia: Secondary | ICD-10-CM

## 2023-04-13 DIAGNOSIS — K5900 Constipation, unspecified: Secondary | ICD-10-CM

## 2023-04-13 DIAGNOSIS — E785 Hyperlipidemia, unspecified: Secondary | ICD-10-CM

## 2023-04-13 DIAGNOSIS — F419 Anxiety disorder, unspecified: Secondary | ICD-10-CM

## 2023-04-13 DIAGNOSIS — I10 Essential (primary) hypertension: Secondary | ICD-10-CM

## 2023-04-13 DIAGNOSIS — H547 Unspecified visual loss: Secondary | ICD-10-CM

## 2023-04-13 DIAGNOSIS — M2550 Pain in unspecified joint: Secondary | ICD-10-CM

## 2023-04-13 DIAGNOSIS — E119 Type 2 diabetes mellitus without complications: Secondary | ICD-10-CM

## 2023-04-13 DIAGNOSIS — IMO0002 Ulcer: Secondary | ICD-10-CM

## 2023-04-13 DIAGNOSIS — M797 Fibromyalgia: Secondary | ICD-10-CM

## 2023-04-13 DIAGNOSIS — K219 Gastro-esophageal reflux disease without esophagitis: Secondary | ICD-10-CM

## 2023-04-13 DIAGNOSIS — R519 Generalized headaches: Secondary | ICD-10-CM

## 2023-04-13 DIAGNOSIS — E039 Hypothyroidism, unspecified: Secondary | ICD-10-CM

## 2023-04-13 DIAGNOSIS — K5090 Crohn's disease, unspecified, without complications: Secondary | ICD-10-CM

## 2023-04-13 DIAGNOSIS — T7840XA Allergy, unspecified, initial encounter: Secondary | ICD-10-CM

## 2023-04-13 DIAGNOSIS — D72829 Elevated white blood cell count, unspecified: Secondary | ICD-10-CM

## 2023-04-13 DIAGNOSIS — R12 Heartburn: Secondary | ICD-10-CM

## 2023-04-13 DIAGNOSIS — R1310 Dysphagia, unspecified: Secondary | ICD-10-CM

## 2023-04-13 DIAGNOSIS — F32A Depression: Secondary | ICD-10-CM

## 2023-04-13 DIAGNOSIS — F99 Mental disorder, not otherwise specified: Secondary | ICD-10-CM

## 2023-04-13 DIAGNOSIS — R110 Nausea: Secondary | ICD-10-CM

## 2023-04-13 DIAGNOSIS — F3289 Other specified depressive episodes: Secondary | ICD-10-CM

## 2023-04-13 DIAGNOSIS — B3781 Candidal esophagitis: Secondary | ICD-10-CM

## 2023-04-13 DIAGNOSIS — K319 Disease of stomach and duodenum, unspecified: Secondary | ICD-10-CM

## 2023-04-13 MED ORDER — FLUCONAZOLE 200 MG PO TAB
ORAL_TABLET | ORAL | 0 refills | 3.00000 days | Status: AC
Start: 2023-04-13 — End: ?

## 2023-04-13 NOTE — Progress Notes
04/13/2023 10:51 AM  per mychart message response, verbal orders were given to send a prescription in for fluconazole from [facility],

## 2023-04-13 NOTE — Progress Notes
Name: Alan [name]          MRN: [id number]      DOB: 1980/08/29      AGE: 42 y.o.   DATE OF SERVICE: 04/13/2023    Subjective:             Reason for Visit:  Heme/Onc Care      Alan [name] is a 42 y.o. male who presents to my hematology clinic for follow-up for his leukocytosis.      History of Present Illness  Hematology history:  Leukocytosis  Referring Provider:  Buckles, Vinnie Level, MD      Contact Summary:  Patient is a 42 year old referred for leukocytosis. Patient saw GI who requested labs be obtained. Patient mentioned history of polcythemia and was seen at [facility] clinic. Other PMH and labs below   Component  Ref Range & Units 01/08/23 08/20/22 12/16/21 1320 06/03/21 2326 12/17/20 2104 12/12/20   White Blood Cells 17.94 High  11.50 High  12.2 High  R 12.2 High  R 13.3 High  R 14.0 High    RBC 5.62 5.51 5.48 R 5.42 R 5.09 R 5.38   Hemoglobin 16.8 16.2 15.9 R 16.5 R 15.0 R 16.5   Hematocrit 48.5 46.8 47.1 R 47.9 R 43.8 R 49.6   MCV 86.3 84.9 86.1 R 88.5 R 86.2 R 92.0   MCH 29.9 29.4 29.1 R 30.4 R 29.5 R 30.7   MCHC 34.6 34.6 33.8 R 34.4 R 34.2 R 33.3   Platelet Count 330 265 256 R 250 R 272 R 290   MPV 8.6 Low  8.9 Low  7.2 R 7.1 R 7.3 R     RDW 14.7 High  14.7 High  15.0 R 14.9 R 15.5 High  R 14.4   Neutrophils 69.7     62 R 64 R     Absolute Neutrophil Count 12.50 High      7.50 High  R 8.50 High  R     Lymphocytes 20.8     24 R 26 R     Absolute Lymph Count 3.74     2.93 R 3.41 R     Monocytes 7.2     9 R 6 R     Absolute Monocyte Count 1.30 High      1.15 High  R 0.77 R     Eosinophil 1.1             Absolute Eosinophil Count 0.19     0.49 High  R 0.59 High  R     Basophils 0.6     1 R 0 R     Absolute Basophil Count 0.10 High      0.09 R 0.03 R     Atypical Lym               Metamyelocyte               Myelocyte               Promyelocyte               Blast               RBC Morph               WBC Morphology               Eosinophils       4 R 4 R     MDW (Monocyte Distrib  Labs from February 26, 2023:  Sed rate was 12 peripheral smear showed mild leukocytosis with absolute neutrophilia and absolute eosinophilia and absolute basophilia with red blood cells normal in number and morphology and platelets normal in number with rare giant forms, white blood cell count was 12.81, hemoglobin is 16.9 with a hematocrit of 49.1, platelet count was 283,000, the P1 90 and P2 10 BCR-ABL fusion transcripts were not detected, flow cytometry showed no immunophenotypic abnormalities detected         Review of Systems   Constitutional:  Positive for chills, diaphoresis (for years) and fatigue (worse). Negative for fever.   HENT:  Negative for congestion.    Eyes:  Negative for visual disturbance.   Respiratory:  Positive for shortness of breath (with exertion). Negative for cough.    Cardiovascular:  Negative for chest pain.   Gastrointestinal:  Positive for constipation and diarrhea.   Genitourinary:  Negative for difficulty urinating.   Musculoskeletal:  Positive for myalgias (fibromyalgia). Negative for arthralgias and back pain.   Skin:  Negative for rash.   Neurological:  Positive for headaches (occ).   Hematological:  Negative for adenopathy. Does not bruise/bleed easily.   Psychiatric/Behavioral: Negative.           Objective:          ALPRAZolam (XANAX) 1 mg tablet Take one tablet by mouth as Needed.    BD ULTRA-FINE SHORT PEN NEEDLE 31 gauge x 5/16 pen needle USE TO INJECT INSULIN 4 TIMES A DAY    cetirizine (ZYRTEC) 10 mg tablet Daily    chlordiazePOXIDE-clidinium (LIBRAX (WITH CLIDINIUM)) 2.5/5 mg capsule TAKE 1 CAPSULE BY MOUTH THREE TIMES A DAY    clonazePAM (KLONOPIN) 1 mg tablet TAKE 1 TABLET BY MOUTH TWICE A DAY AS NEEDED FOR ANXIETY    diazePAM (VALIUM) 10 mg tablet Take one tablet by mouth twice daily.    dicyclomine (BENTYL) 20 mg tablet TAKE ONE TABLET BY MOUTH FOUR TIMES DAILY AS NEEDED    diphenhydrAMINE (BENADRYL) 25 mg capsule 1 Cap, QID, PO, PRN, Allergy    diphenoxylate-atropine (LOMOTIL) 2.5-0.025 mg tablet TAKE 1 TABLET BY MOUTH FOUR TIMES A DAY AS NEEDED FOR DIARRHEA    ERGOcalciferoL (vitamin D2) (DRISDOL) 1,250 mcg (50,000 unit) capsule TAKE 1 CAPSULE BY MOUTH EVERY 7 DAYS FOR VITAMIN D DEFICIENCY    famotidine (PEPCID) 20 mg tablet Take one tablet by mouth daily.    FARXIGA 10 mg tablet Take one tablet by mouth every morning.    fluconazole (DIFLUCAN) 200 mg tablet Take two tablets by mouth daily for 1 day, THEN one tablet daily for 14 days.    gabapentin (NEURONTIN) 300 mg capsule Take one capsule by mouth three times daily.    HUMALOG KWIKPEN INSULIN 100 unit/mL subcutaneous PEN Inject four units under the skin three times daily with meals. Correct for blood sugar over 220. BG 221-260: give 2 units. BG 261-300 give 4 units. BG 301-350: give 6 units. 351-400: 8 units. >400: 10 units    hydrOXYzine HCL (ATARAX) 25 mg tablet Take one tablet by mouth every 6 hours as needed. Indications: anxious    L-Methylfolate (DEPLIN 15) 15 mg tablet Take one tablet by mouth daily.    levothyroxine (SYNTHROID) 25 mcg tablet Take one tablet by mouth daily 30 minutes before breakfast.    lipase-protease-amylase (PANCREAZE) 37,000-97,300- 149,900 unit capsule Take one capsule by mouth three times daily. Please use these instructions: Take two capsules WITH each meal, and one capsule  with each snack that contains fat or protein. May have three snacks daily.    lubiprostone (AMITIZA) 24 mcg capsule Take one capsule by mouth twice daily with meals. Take with food and water.    NIFEDIPINE/LIDOCAINE 0.3%/1.5% IN PETROLATUM OINTMENT (BATCHED COMPOUND) Apply to anal area three times daily as needed    nortriptyline (PAMELOR) 50 mg capsule Take one capsule by mouth at bedtime daily.    ONETOUCH DELICA PLUS LANCET 33 gauge USE TO TEST BLOOD SUGAR 4 TIMES PER DAY.    pantoprazole DR (PROTONIX) 40 mg tablet Take one tablet by mouth twice daily.    peg-electrolyte solution (NULYTELY LEMON-LIME) 420 gram oral solution FOR BOWEL CLEANSE: Add water to fill line. Shake until dissolved. May taste better if refrigerated until cold. Drink half of the jug throughout the day. You may still eat during this process. If ineffective, drink the other half of the jug the following day.    pramoxine-hydrocortisone (ANALPRAM-HC) 1-1 % rectal cream Insert or Apply one g to rectal area as directed twice daily.    QUEtiapine XR (SEROQUEL XR) 150 mg tablet Take one tablet by mouth at bedtime daily.    rosuvastatin (CRESTOR) 40 mg tablet Take one tablet by mouth daily.    STELARA 90 mg/mL syringe INJECT 1 SYRINGE SUBCUTANEOUSLY  EVERY 4 WEEKS    TRESIBA FLEXTOUCH U-200 200 unit/mL (3 mL) injection PEN INJECT 50 UNITS SUBCUTANEOUSLY DAILY    VASCEPA 1 gram capsule TAKE 2 CAPSULES BY MOUTH TWICE A DAY     There were no vitals filed for this visit.    There is no height or weight on file to calculate BMI.                  Pain Addressed:  N/A    Patient Evaluated for a Clinical Trial: No treatment clinical trial available for this patient.     [place] Cooperative Oncology Group performance status is 0, Fully active, able to carry on all pre-disease performance without restriction.Marland Kitchen     Physical Exam          Assessment and Plan:  1. Leukocytosis-his sed rate was 12, peripheral smear showed mild leukocytosis with absolute neutrophilia and absolute eosinophilia and absolute basophilia with red cells normal number morphology and platelets normal in number with rare giant forms, white blood cell count was 12.81, hemoglobin 16.9 with a platelet count of 283,000 his P1 90 and P2 10 BCR-ABL fusion transfers were not detected and his flow cytometry was negative.I will check in CBC in 3 months.  2. Return to clinic- I will have the patient return to clinic in three months.    I answered the patient's questions to his satisfaction.  I spent 40 minutes of this visit in which at least 50% of it was face to face consultation with the patient.

## 2023-04-15 ENCOUNTER — Encounter: Admit: 2023-04-15 | Discharge: 2023-04-15 | Payer: Private Health Insurance - Indemnity

## 2023-04-15 DIAGNOSIS — K21 Gastroesophageal reflux disease with esophagitis without hemorrhage: Secondary | ICD-10-CM

## 2023-04-15 MED ORDER — PANTOPRAZOLE 40 MG PO TBEC
40 mg | ORAL_TABLET | Freq: Two times a day (BID) | ORAL | 3 refills | 90.00000 days | Status: AC
Start: 2023-04-15 — End: ?

## 2023-04-15 NOTE — Telephone Encounter
All Protocol Elements met  Requested Renewals     Name from pharmacy: PANTOPRAZOLE SOD DR 40 MG TAB         Will file in chart as: pantoprazole DR (PROTONIX) 40 mg tablet    Sig: TAKE 1 TABLET BY MOUTH TWICE A DAY    Disp: 180 tablet    Refills: 3    Start: 04/15/2023    Class: Normal    For: Gastroesophageal reflux disease with esophagitis without hemorrhage    Last ordered: 1 year ago (01/13/2022) by Amadeo Garnet, APRN-NP    Last refill: 01/12/2023    Rx #: [id number]    Gastroenterology: Acid Reflux Passed10/17/2024 12:22 AM   Protocol Details Valid encounter within last 12 months      To be filled at: [id number] - [place], [place] - [address]          Patient was last seen on 02/17/23 and has follow up scheduled on 08/10/23.

## 2023-04-28 ENCOUNTER — Encounter: Admit: 2023-04-28 | Discharge: 2023-04-28 | Payer: Private Health Insurance - Indemnity

## 2023-05-06 ENCOUNTER — Encounter: Admit: 2023-05-06 | Discharge: 2023-05-06 | Payer: Private Health Insurance - Indemnity

## 2023-05-07 ENCOUNTER — Encounter: Admit: 2023-05-07 | Discharge: 2023-05-07 | Payer: Private Health Insurance - Indemnity

## 2023-05-11 ENCOUNTER — Ambulatory Visit: Admit: 2023-05-11 | Discharge: 2023-05-11 | Payer: Private Health Insurance - Indemnity

## 2023-05-11 ENCOUNTER — Encounter: Admit: 2023-05-11 | Discharge: 2023-05-11 | Payer: Private Health Insurance - Indemnity

## 2023-05-12 ENCOUNTER — Encounter: Admit: 2023-05-12 | Discharge: 2023-05-12 | Payer: Private Health Insurance - Indemnity

## 2023-05-12 ENCOUNTER — Ambulatory Visit: Admit: 2023-05-12 | Discharge: 2023-05-12 | Payer: Private Health Insurance - Indemnity

## 2023-05-12 MED ORDER — PROPOFOL 10 MG/ML IV EMUL 50 ML (INFUSION)(AM)(OR)
INTRAVENOUS | 0 refills | Status: DC
Start: 2023-05-12 — End: 2023-05-12
  Administered 2023-05-12: 16:00:00 150 ug/kg/min via INTRAVENOUS

## 2023-05-12 MED ORDER — PROPOFOL INJ 10 MG/ML IV VIAL
INTRAVENOUS | 0 refills | Status: DC
Start: 2023-05-12 — End: 2023-05-12

## 2023-05-12 MED ORDER — LIDOCAINE (PF) 20 MG/ML (2 %) IJ SOLN
INTRAVENOUS | 0 refills | Status: DC
Start: 2023-05-12 — End: 2023-05-12

## 2023-05-14 ENCOUNTER — Encounter: Admit: 2023-05-14 | Discharge: 2023-05-14 | Payer: Private Health Insurance - Indemnity

## 2023-05-29 ENCOUNTER — Encounter: Admit: 2023-05-29 | Discharge: 2023-05-29 | Payer: Private Health Insurance - Indemnity

## 2023-06-01 ENCOUNTER — Encounter: Admit: 2023-06-01 | Discharge: 2023-06-01 | Payer: Private Health Insurance - Indemnity

## 2023-06-01 DIAGNOSIS — K5000 Crohn's disease of small intestine without complications: Secondary | ICD-10-CM

## 2023-06-02 ENCOUNTER — Inpatient Hospital Stay
Admit: 2023-06-02 | Discharge: 2023-06-17 | Disposition: A | Discharge status: Home / Self Care | Payer: Private Health Insurance - Indemnity | Source: Other Acute Inpatient Hospital

## 2023-06-02 ENCOUNTER — Encounter: Admit: 2023-06-02 | Discharge: 2023-06-02 | Payer: Private Health Insurance - Indemnity

## 2023-06-02 ENCOUNTER — Inpatient Hospital Stay: Admit: 2023-06-02 | Discharge: 2023-06-02 | Payer: Private Health Insurance - Indemnity

## 2023-06-02 ENCOUNTER — Inpatient Hospital Stay: Admit: 2023-06-02 | Discharge: 2023-06-03 | Payer: Private Health Insurance - Indemnity

## 2023-06-02 LAB — 2D + DOPPLER ECHO
AORTIC VALVE STROKE VOLUME INDEX: 27
ASCENDING AORTA: 3.2 cm
AV INDEX (NATIVE): 0.8
AV PEAK VELOCITY: 1.2 m/s
BSA: 2.1 m2
DOP CALC LVOT AREA: 3.1 cm2
DOP CALC LVOT DIAMETER: 2 cm
DOP CALC LVOT PEAK VEL VTI: 17 cm
DOP CALC LVOT PEAK VEL: 1 m/s
DOP CALC LVOT STROKE VOLUME: 56 cm3
E/A RATIO: 1
EJECTION FRACTION: 78 %
FRACTIONAL SHORTENING: 46 % (ref 28–44)
LATERAL E/E' RATIO: 4.4
LEFT ATRIUM INDEX: 18 mL/m2 (ref 16–34)
LEFT ATRIUM SIZE: 3.9 cm (ref 3–4)
LEFT ATRIUM VOLUME: 40 mL (ref 18–58)
LEFT VENTRICLE DIASTOLIC VOLUME INDEX: 56 mL/m2 (ref 34–74)
LEFT VENTRICLE MASS INDEX: 103 g/m2 (ref 49–115)
LEFT VENTRICLE SYSTOLIC VOLUME INDEX: 23 mL/m2 (ref 11–31)
LEFT VENTRICULAR MASS: 220 g (ref 88–224)
MEDIAL E/E' RATIO: 7.1
MV PEAK A VEL: 0.7 m/s
MV PEAK E VEL PW: 0.7 m/s
POSTERIOR WALL: 1.1 cm (ref 0.6–1)
RA PRESSURE: 3
RELATIVE WALL THICKNESS: 0.4 (ref <=0.42)
RIGHT ATRIAL AREA: 13 cm2 (ref <18)
RIGHT VENTRICULAR BASAL DIAMETER: 3.5 cm (ref 2.5–4.1)
RIGHT VENTRICULAR MID DIAMETER: 2.8 cm (ref 1.9–3.5)
SIMPSONS BIPLANE EF: 60 %
SINUS: 3.3 cm (ref 2.8–4)
TDI MEDIAL E': 0.1 m/s

## 2023-06-02 LAB — COMPREHENSIVE METABOLIC PANEL
~~LOC~~ BKR ALBUMIN: 3.7 g/dL (ref 3.5–5.0)
~~LOC~~ BKR ALK PHOSPHATASE: 249 U/L — ABNORMAL HIGH (ref 25–110)
~~LOC~~ BKR ALT: 214 U/L — ABNORMAL HIGH (ref 7–56)
~~LOC~~ BKR ANION GAP: 10 10*3/uL (ref 3–12)
~~LOC~~ BKR AST: 98 U/L — ABNORMAL HIGH (ref 7–40)
~~LOC~~ BKR BLD UREA NITROGEN: 11 mg/dL (ref 7–25)
~~LOC~~ BKR CALCIUM: 8.6 mg/dL (ref 8.5–10.6)
~~LOC~~ BKR CHLORIDE: 104 mmol/L (ref 98–110)
~~LOC~~ BKR CO2: 22 mmol/L (ref 21–30)
~~LOC~~ BKR CREATININE: 0.8 mg/dL (ref 0.40–1.24)
~~LOC~~ BKR GLOMERULAR FILTRATION RATE (GFR): >60 mL/min (ref >60–4.80)
~~LOC~~ BKR POTASSIUM: 3.5 mmol/L (ref 3.5–5.1)
~~LOC~~ BKR TOTAL BILIRUBIN: 4.9 mg/dL — ABNORMAL HIGH (ref 0.3–1.2)
~~LOC~~ BKR TOTAL PROTEIN: 6.6 g/dL (ref 6.0–8.0)

## 2023-06-02 LAB — ECG 12-LEAD
P AXIS: 72 degrees (ref 4.2–5.8)
P-R INTERVAL: 166 ms
Q-T INTERVAL: 386 ms (ref 21–61)
QRS DURATION: 118 ms (ref 62–150)
QTC CALCULATION (BAZETT): 456 ms (ref 0.6–1)
R AXIS: -40 degrees (ref 2.5–4)
VENTRICULAR RATE: 84 {beats}/min

## 2023-06-02 LAB — POC GLUCOSE
~~LOC~~ BKR POC GLUCOSE: 105 mg/dL — ABNORMAL HIGH (ref 70–100)
~~LOC~~ BKR POC GLUCOSE: 110 mg/dL — ABNORMAL HIGH (ref 70–100)
~~LOC~~ BKR POC GLUCOSE: 138 mg/dL — ABNORMAL HIGH (ref 70–100)
~~LOC~~ BKR POC GLUCOSE: 99 mg/dL (ref 70–100)
~~LOC~~ BKR POC GLUCOSE: 87 mg/dL (ref 70–100)

## 2023-06-02 LAB — CBC AND DIFF
~~LOC~~ BKR ABSOLUTE BASO COUNT: 0.1 10*3/uL (ref 0.00–0.20)
~~LOC~~ BKR ABSOLUTE EOS COUNT: 1.2 10*3/uL — ABNORMAL HIGH (ref 0.00–0.45)
~~LOC~~ BKR ABSOLUTE MONO COUNT: 1.3 10*3/uL — ABNORMAL HIGH (ref 0.00–0.80)
~~LOC~~ BKR MCV: 88 fL — ABNORMAL HIGH (ref 80.0–100.0)
~~LOC~~ BKR RBC COUNT: 5 10*6/uL — ABNORMAL LOW (ref 4.40–5.50)
~~LOC~~ BKR WBC COUNT: 9.5 10*3/uL (ref 4.50–11.00)

## 2023-06-02 LAB — C REACTIVE PROTEIN (CRP): ~~LOC~~ BKR C-REACTIVE PROTEIN: 1 mg/dL — ABNORMAL HIGH (ref <1.00)

## 2023-06-02 LAB — HEPATITIS PANEL, ACUTE

## 2023-06-02 LAB — IRON + BINDING CAPACITY + %SAT+ FERRITIN: ~~LOC~~ BKR IRON: 118 g/dL (ref 50–185)

## 2023-06-02 LAB — NT-PRO-BNP: ~~LOC~~ BKR NT-PRO-BNP: 61 pg/mL (ref <125)

## 2023-06-02 LAB — SED RATE: ~~LOC~~ BKR ESR: 23 mm/h — ABNORMAL HIGH (ref <=15)

## 2023-06-02 MED ORDER — INSULIN GLARGINE 100 UNIT/ML (3 ML) SC INJ PEN
15 [IU] | Freq: Two times a day (BID) | SUBCUTANEOUS | 0 refills | Status: DC
Start: 2023-06-02 — End: 2023-06-06
  Administered 2023-06-02: 15:00:00 15 [IU] via SUBCUTANEOUS

## 2023-06-02 MED ORDER — POLYETHYLENE GLYCOL 3350 17 GRAM PO PWPK
1 | Freq: Every day | ORAL | 0 refills | Status: DC | PRN
Start: 2023-06-02 — End: 2023-06-12
  Administered 2023-06-04 – 2023-06-08 (×3): 17 g via ORAL

## 2023-06-02 MED ORDER — ALPRAZOLAM 0.5 MG PO TAB
.5 mg | Freq: Three times a day (TID) | ORAL | 0 refills | Status: DC | PRN
Start: 2023-06-02 — End: 2023-06-05
  Administered 2023-06-02 – 2023-06-05 (×4): 0.5 mg via ORAL

## 2023-06-02 MED ORDER — LEVOTHYROXINE 25 MCG PO TAB
25 ug | Freq: Every day | ORAL | 0 refills | Status: DC
Start: 2023-06-02 — End: 2023-06-17
  Administered 2023-06-02 – 2023-06-17 (×16): 25 ug via ORAL

## 2023-06-02 MED ORDER — FLUTICASONE PROPIONATE 50 MCG/ACTUATION NA SPSN
2 | Freq: Two times a day (BID) | NASAL | 0 refills | Status: DC | PRN
Start: 2023-06-02 — End: 2023-06-17

## 2023-06-02 MED ORDER — MORPHINE 15 MG PO TAB
15 mg | ORAL | 0 refills | Status: DC | PRN
Start: 2023-06-02 — End: 2023-06-17
  Administered 2023-06-02 – 2023-06-15 (×14): 15 mg via ORAL

## 2023-06-02 MED ORDER — DIPHENHYDRAMINE HCL 25 MG PO CAP
25 mg | ORAL | 0 refills | Status: DC | PRN
Start: 2023-06-02 — End: 2023-06-02
  Administered 2023-06-02: 08:00:00 25 mg via ORAL

## 2023-06-02 MED ORDER — METHYLPREDNISOLONE SOD SUC(PF) 125 MG/2 ML IJ SOLR
62.5 mg | Freq: Once | INTRAVENOUS | 0 refills | Status: CP
Start: 2023-06-02 — End: ?
  Administered 2023-06-03: 02:00:00 62.5 mg via INTRAVENOUS

## 2023-06-02 MED ORDER — ENOXAPARIN 40 MG/0.4 ML SC SYRG
40 mg | Freq: Every day | SUBCUTANEOUS | 0 refills | Status: DC
Start: 2023-06-02 — End: 2023-06-02

## 2023-06-02 MED ORDER — ROSUVASTATIN 20 MG PO TAB
40 mg | Freq: Every day | ORAL | 0 refills | Status: DC
Start: 2023-06-02 — End: 2023-06-02

## 2023-06-02 MED ORDER — SENNOSIDES-DOCUSATE SODIUM 8.6-50 MG PO TAB
1 | Freq: Every day | ORAL | 0 refills | Status: DC | PRN
Start: 2023-06-02 — End: 2023-06-12
  Administered 2023-06-04 – 2023-06-05 (×2): 1 via ORAL

## 2023-06-02 MED ORDER — KETOROLAC 15 MG/ML IJ SOLN
15 mg | Freq: Once | INTRAVENOUS | 0 refills | Status: CP
Start: 2023-06-02 — End: ?
  Administered 2023-06-02: 13:00:00 15 mg via INTRAVENOUS

## 2023-06-02 MED ORDER — SODIUM CHLORIDE 0.9 % IR SOLN
0 refills | Status: DC
Start: 2023-06-02 — End: 2023-06-03

## 2023-06-02 MED ORDER — LIPASE-PROTEASE-AMYLASE (ZENPEP 40) 40,000-126,000- 168,000 UNIT PO CPDR
1 | Freq: Three times a day (TID) | ORAL | 0 refills | Status: DC
Start: 2023-06-02 — End: 2023-06-04
  Administered 2023-06-03 – 2023-06-04 (×3): 1 via ORAL

## 2023-06-02 MED ORDER — IOHEXOL 300 MG IODINE/ML IV SOLN
0 refills | Status: DC
Start: 2023-06-02 — End: 2023-06-03

## 2023-06-02 MED ORDER — SODIUM CHLORIDE 0.9 % IJ SOLN
10 mL | Freq: Once | INTRAVENOUS | 0 refills | Status: CP
Start: 2023-06-02 — End: ?
  Administered 2023-06-02: 21:00:00 10 mL via INTRAVENOUS

## 2023-06-02 MED ORDER — ACETAMINOPHEN 500 MG PO TAB
1000 mg | Freq: Once | ORAL | 0 refills | Status: DC | PRN
Start: 2023-06-02 — End: 2023-06-03

## 2023-06-02 MED ORDER — ONDANSETRON 4 MG PO TBDI
4 mg | ORAL | 0 refills | Status: DC | PRN
Start: 2023-06-02 — End: 2023-06-17
  Administered 2023-06-05: 4 mg via ORAL

## 2023-06-02 MED ORDER — NORTRIPTYLINE 25 MG PO CAP
50 mg | Freq: Every evening | ORAL | 0 refills | Status: DC
Start: 2023-06-02 — End: 2023-06-17
  Administered 2023-06-03 – 2023-06-17 (×15): 50 mg via ORAL

## 2023-06-02 MED ORDER — DIPHENHYDRAMINE HCL 50 MG/ML IJ SOLN
25 mg | Freq: Once | INTRAVENOUS | 0 refills | Status: CP
Start: 2023-06-02 — End: ?
  Administered 2023-06-03: 02:00:00 25 mg via INTRAVENOUS

## 2023-06-02 MED ORDER — ONDANSETRON HCL (PF) 4 MG/2 ML IJ SOLN
4 mg | INTRAVENOUS | 0 refills | Status: DC | PRN
Start: 2023-06-02 — End: 2023-06-17
  Administered 2023-06-13 – 2023-06-16 (×3): 4 mg via INTRAVENOUS

## 2023-06-02 MED ORDER — DIPHENHYDRAMINE HCL 50 MG/ML IJ SOLN
25 mg | Freq: Once | INTRAVENOUS | 0 refills | Status: DC | PRN
Start: 2023-06-02 — End: 2023-06-03

## 2023-06-02 MED ORDER — PERFLUTREN LIPID MICROSPHERES 1.1 MG/ML IV SUSP
1-10 mL | Freq: Once | INTRAVENOUS | 0 refills | Status: CP | PRN
Start: 2023-06-02 — End: ?
  Administered 2023-06-02: 21:00:00 1.5 mL via INTRAVENOUS

## 2023-06-02 MED ORDER — FENTANYL CITRATE (PF) 50 MCG/ML IJ SOLN
25 ug | INTRAVENOUS | 0 refills | Status: AC | PRN
Start: 2023-06-02 — End: ?
  Administered 2023-06-02 (×3): 25 ug via INTRAVENOUS

## 2023-06-02 MED ORDER — HYDROXYZINE HCL 10 MG PO TAB
10 mg | Freq: Three times a day (TID) | ORAL | 0 refills | Status: DC | PRN
Start: 2023-06-02 — End: 2023-06-07
  Administered 2023-06-02 – 2023-06-07 (×11): 10 mg via ORAL

## 2023-06-02 MED ORDER — LACTATED RINGERS IV SOLP
INTRAVENOUS | 0 refills | Status: CN
Start: 2023-06-02 — End: ?

## 2023-06-02 MED ORDER — INSULIN ASPART 100 UNIT/ML SC FLEXPEN
0-6 [IU] | Freq: Before meals | SUBCUTANEOUS | 0 refills | Status: DC
Start: 2023-06-02 — End: 2023-06-14
  Administered 2023-06-04: 1 [IU] via SUBCUTANEOUS

## 2023-06-02 MED ORDER — FENTANYL CITRATE (PF) 50 MCG/ML IJ SOLN
50 ug | INTRAVENOUS | 0 refills | Status: DC | PRN
Start: 2023-06-02 — End: 2023-06-03
  Administered 2023-06-03: 03:00:00 50 ug via INTRAVENOUS

## 2023-06-02 MED ORDER — FAMOTIDINE (PF) 20 MG/2 ML IV SOLN
20 mg | Freq: Once | INTRAVENOUS | 0 refills | Status: CP
Start: 2023-06-02 — End: ?
  Administered 2023-06-03: 02:00:00 20 mg via INTRAVENOUS

## 2023-06-02 MED ORDER — GABAPENTIN 300 MG PO CAP
300 mg | Freq: Three times a day (TID) | ORAL | 0 refills | Status: DC
Start: 2023-06-02 — End: 2023-06-17
  Administered 2023-06-02 – 2023-06-17 (×43): 300 mg via ORAL

## 2023-06-02 MED ORDER — HALOPERIDOL LACTATE 5 MG/ML IJ SOLN
1 mg | Freq: Once | INTRAVENOUS | 0 refills | Status: CP | PRN
Start: 2023-06-02 — End: ?
  Administered 2023-06-03: 03:00:00 1 mg via INTRAVENOUS

## 2023-06-02 MED ORDER — DEXTROSE 50 % IN WATER (D50W) IV SYRG
12.5-25 g | INTRAVENOUS | 0 refills | Status: DC | PRN
Start: 2023-06-02 — End: 2023-06-17

## 2023-06-02 MED ORDER — MELATONIN 5 MG PO TAB
5 mg | Freq: Every evening | ORAL | 0 refills | Status: DC | PRN
Start: 2023-06-02 — End: 2023-06-17
  Administered 2023-06-02 – 2023-06-17 (×10): 5 mg via ORAL

## 2023-06-02 NOTE — Procedures
VAT consulted for PIV. Upon arrival to room, patient off unit for procedure. Consult completed.

## 2023-06-02 NOTE — Progress Notes
General Progress Note    Admission Date: 06/02/2023       LOS: 0 days    Date of Service: 06/02/2023    Assessment/Plan:      Alan [name] is a 42 y.o. male. With past medical history of crohn's disease, diabetes, GAD, fibromyalgia presented to [place] as a transfer from outside hospital for elevated LFTs and itching.      Acute Liver Injury, improving  Pruritus  -AST of 132, ALT 320, ALP 325, TB of 5.1 at OSH  -Hepatitis panel negative  -CT Abdomen at OSH shows mild esophagitis, small pericardial effusion, mild atrophic pancreas with faint calcifications due to chronic pancreatitis   -RUQ U/S: Prior cholecystectomy with dilated CBD.  Mild diffuse parenchymal hepatic disease, mild splenomegaly  -Ddx: DILI d/t Augmentin vs biliary pathology (I.e. PSC) 2/2 Crohn's Disease  Plan  -Consult Hepatology  -ERCP today  -D/c Augmentin  -Obtain further lab work: BNP, EBV panel, HSV serum PCR, CMV IgG/IgM with serum PCR, iron profile, alpha-1 antitrypsin, ANA, AMA, ASMA, acute hepatitis panel, CRP  -Obtain fecal calprotectin if able (patient lives further away and outpatient GI will have available for upcoming appointment)     Small Pericardial Effusion  -Noted incidentally on CT A/P at OSH  -No chest pain, has diffuse pain due to fibromyalgia history  Plan  -Obtain TTE     Chronic Pancreatitis  -Continue home pancreatic enzymes supplements     Upper Respiratory Tract Symptoms for the past one week/Sinusitis  -Started on Augmentin, holding due to ALI as above  Plan  -Augmentin, received 5 days of treatment so probably does not need anymore anyway  -Obtain chest x-ray due to continued cough     Crohn's disease history  -Follows up with GI. Denies current black/bloody bowel movements  Plan  -Currently on Stelera every 4 weeks  -Outpatient GI follow up     T2DM  -Last A1c 9.1 (06/05/2021)   -PTA on Tresiba 30 units BID, 4 units TIDWM  Plan  -Down-titrated insulin to glargine 15 units BID, aspart LDCF given NPO status and poor PO intake  -Re-uptitrate insulin back to PTA doses as able  -Update A1c    Hypothyroidism  -Continue home levothyroxine     Anxiety Disorder  -Continue home Xanax PRN  -Continue nor tryptialine    FEN: NPO for ERCP  DVT ppx: Holding for ERCP  Code Status: FULL CODE    I spent >50 minutes total time in patient care today, including reviewing the patient's chart, interviewing and examining the patient, counseling the patient on the plan, coordinating the patient's care, and documenting the patient encounter.    Mahlon Gammon, MD  Internal Medicine - Hospitalist   Available on [place] and Pager [phone])  Please use the Med Private First Call for all patient-related communications. Personal Voaltes and pagers are not answered at all hours.  ________________________________________________________________________    Subjective  Overnight, there were no acute events. This morning, reports allover pain.  Denies any black or bloody bowel movements.  Pain appears chronic due to his fibromyalgia.  Denies out right chest pain.  No shortness of breath.    Medications  Scheduled Meds:gabapentin (NEURONTIN) capsule 300 mg, 300 mg, Oral, TID  insulin aspart (U-100) (NOVOLOG FLEXPEN U-100 INSULIN) injection PEN 0-6 Units, 0-6 Units, Subcutaneous, ACHS (22)  insulin glargine (LANTUS SOLOSTAR U-100 INSULIN) injection PEN 15 Units, 15 Units, Subcutaneous, BID  levothyroxine (SYNTHROID) tablet 25 mcg, 25 mcg, Oral, QDAY 30 min before  breakfast  lipase-protease-amylase (ZENPEP 40) capsule 1 capsule, 1 capsule, Oral, TID w/ meals  nortriptyline (PAMELOR) capsule 50 mg, 50 mg, Oral, QHS    Continuous Infusions:  PRN and Respiratory Meds:ALPRAZolam TID PRN, dextrose 50% (D50) IV PRN, diphenhydrAMINE HCL Q6H PRN, fentaNYL citrate PF Q3H PRN, fluticasone propionate BID PRN, melatonin QHS PRN, ondansetron Q6H PRN **OR** ondansetron (ZOFRAN) IV Q6H PRN, polyethylene glycol 3350 QDAY PRN, sennosides-docusate sodium QDAY PRN      Review of Systems:  All other systems reviewed and are negative.    Objective:                          Vital Signs: Last Filed                 Vital Signs: 24 Hour Range   BP: 117/83 (12/04 0433)  Temp: 36.6 ?C (97.9 ?F) (12/04 2956)  Pulse: 85 (12/04 0433)  Respirations: 18 PER MINUTE (12/04 0433)  SpO2: 98 % (12/04 0433)  O2 Device: None (Room air) (12/04 0433)  Height: 175.3 cm (5' 9) (12/04 0100) BP: (111-117)/(83-85)   Temp:  [36.5 ?C (97.7 ?F)-36.6 ?C (97.9 ?F)]   Pulse:  [79-85]   Respirations:  [18 PER MINUTE]   SpO2:  [98 %]   O2 Device: None (Room air)     Vitals:    06/02/23 0100   Weight: 93 kg (205 lb 0.4 oz)           Physical Exam    General: awake & oriented, no acute distress  Head: normocephalic, atraumatic  Eyes: non-icteric, clear conjunctivae  Mouth: moist mucus membranes, clear posterior oropharynx  CV: regular rhythm, no murmur/rub/gallop  Lungs: clear to ausculation bilaterally, no wheezes/rhonchi/crackles  Abdomen: soft, non-tender, non-distended, normo-active bowel sounds  Extremities: no clubbing/cyanosis/edema  Neuro: no focal deficits    Lab Review  Pertinent labs reviewed    Point of Care Testing  (Last 24 hours)  Glucose: (!) 150 (06/02/23 0212)  POC Glucose (Download): (!) 138 (06/02/23 0729)    Radiology and other Diagnostics Review:    Pertinent radiology reviewed.

## 2023-06-02 NOTE — Consults
Hepatology Consult Note  ==========================      Patient Name:Alan [name]         XBM:[id number]  Admission Date: 06/02/2023  1:09 AM    Admission diagnosis: Elevated LFTs [R79.89]     Reason for consult:     elevated lfts/crohn's disease         Principal Problem:    Elevated LFTs        Assessment/ Plan:    Elevated LFTs, cholestatic pattern  mild terminal ileal Crohn's disease.   on Stelara every 4 weeks   BMI 30  T2DM, HLD      R factor of 1.7 indicating cholestatic injury.  Differential most likely drug-induced liver injury especially from Augmentin.  Other differential in the setting of IBD includes PSC.  Other differentials less likely: Infectious hepatitis (including CMV, EBV, HSV, VZV) especially in the setting of mild immunosuppression with Stelara, heart failure and congestive hepatopathy, autoimmune hepatitis  May have underlying metabolic associated fatty liver disease.  Has a history of pancreatitis 3 to 4 years ago.  Cholecystectomy 3 to 4 years ago.  On abd u/s, The common duct measures 10 mm at the porta hepatis     Current plan is for EUS +/- ERCP to assess if there is a stricture or retained stone    Recommendations:  --Hold further Augmentin  --Continue to trend CMP daily  --Obtain further lab work: BNP, EBV panel, HSV serum PCR, CMV IgG/IgM with serum PCR, iron profile, alpha-1 antitrypsin, ANA, AMA, ASMA, acute hepatitis panel, CRP  --Will follow-up TTE  --Depending on lab results, could consider liver biopsy  --Plan for EUS +/- ERCP if retained stone or dilation needed  -- Obtain fecal calprotectin if able (patient lives further away and outpatient GI will have available for upcoming appointment)    Patient should continue his outpatient follow-up with GI for his Crohn's disease in February 2024    Patient seen/discussed with Dr. Shea Evans.  For any questions/concerns, please reach out to GI fellow on-call.          History of Present Illness:  Alan [name] is a 42 y.o. male with history of mild terminal ileal Crohn's disease, SIBO, HLD, GERD, GAD, fibromyalgia, diabetes who was admitted to [facility] on 06/02/2023 with cc of itching and elevated LFTs    Patient is a transfer from outside facility.  Patient recently had URI and was started on Augmentin on Wednesday, 05/26/2023.  Started to develop pruritus and change in urine color.  Denies travel history.  No alcohol over the last 6 months (then had 1 drink at that time, the last time prior to that was a year previous).  Has a history of pancreatitis a few years ago along with cholecystectomy a few years ago.  No sick contacts.  Has right upper quadrant pain intermittently unsure if it is associated with eating.      Prior Endoscopic studies:  Previous Colonoscopy:   02/2022  Findings:       A few scattered small aphthous erosions were found in the distal ileum.        Biopsies were taken with a cold forceps for histology. There was no        narrowing or stenosis.        Non-bleeding external hemorrhoids were found during retroflexion. The        hemorrhoids were small.        The exam was otherwise without abnormality on direct  and retroflexion        views.        Biopsies were taken with a cold forceps in the entire colon for        histology.   A. Duodenal mucosa, duodenal bxs r/o celiac, biopsy:   No diagnostic abnormalities.      B. Gastric mucosa, gastric bxs r/o h pylori, biopsy:   Antral and fundic type gastric mucosa with reactive changes.   No H. pylori-like organisms are identified on H and E sections.      C. Squamous mucosa, esophageal bxs r/o eoe, biopsy:   Squamocolumnar junctional mucosa with reactive epithelial changes.   No increased intraepithelial eosinophils present.     D. Colonic mucosa, terminal ileum bxs h/o Crohn's, biopsy:   Ileal mucosa with focal active ileitis.   Negative for dysplasia.     E. Colonic mucosa, random colon bxs r/o inflammation, h/o Crohn's,   biopsy:   No diagnostic abnormalities.       Previous EGD:   05/12/2023  Indications:                  Dysphagia. Dysphagia, history of Crohn's                                 Ileitis; EGD 09 20 23 GE junction stricture                                 dilated to 18 mm; now has recurrent dysphagia;                                 smokes; intermittent reflux; BID PPI; last                                 dilation worked well but now has recurrent                                 symptoms. Struggling with getting solid food                                 down, has to wash it down with liquids.   Findings:       The examined esophagus was normal.        The Z-line was regular and was found 40 cm from the incisors. I did not        see a definite stricture.        A large amount of food (residue) was found in the gastric fundus and in        the gastric body at which point the procedure was terminated.        duodenum was not examined.       --------------------------------------------------------------------------------------------------------------------------------------  PMH:  Past Medical History:   Diagnosis Date    Accidental fall     Acid reflux     Allergy In file    Anxiety Jan 2022    Constipation All the time    Crohn's disease [facility])     Depression Jan 2022    Depressive disorder, not  elsewhere classified 06/30/2019    Essential hypertension 2019    Fibromyalgia     Generalized headaches 2019    Heartburn     Hyperlipidemia     Hypothyroid     Joint pain March 2018    Im guessing    Nausea Multiple times    Polycythemia, secondary     Psychiatric illness     Stomach disorder     Type II diabetes mellitus (HCC)     Ulcer 2019    Vision decreased        Current medications:  No current facility-administered medications on file prior to encounter.     Current Outpatient Medications on File Prior to Encounter   Medication Sig Dispense Refill    ALPRAZolam (XANAX) 1 mg tablet Take one tablet by mouth as Needed.      BD ULTRA-FINE SHORT PEN NEEDLE 31 gauge x 5/16 pen needle USE TO INJECT INSULIN 4 TIMES A DAY      chlordiazePOXIDE-clidinium (LIBRAX (WITH CLIDINIUM)) 2.5/5 mg capsule TAKE 1 CAPSULE BY MOUTH THREE TIMES A DAY 270 capsule 2    diazePAM (VALIUM) 10 mg tablet Take one tablet by mouth twice daily.      dicyclomine (BENTYL) 20 mg tablet TAKE ONE TABLET BY MOUTH FOUR TIMES DAILY AS NEEDED 360 tablet 1    diphenoxylate-atropine (LOMOTIL) 2.5-0.025 mg tablet TAKE 1 TABLET BY MOUTH FOUR TIMES A DAY AS NEEDED FOR DIARRHEA 60 tablet 0    ERGOcalciferoL (vitamin D2) (DRISDOL) 1,250 mcg (50,000 unit) capsule TAKE 1 CAPSULE BY MOUTH EVERY 7 DAYS FOR VITAMIN D DEFICIENCY 12 capsule 0    famotidine (PEPCID) 20 mg tablet Take one tablet by mouth daily.      FARXIGA 10 mg tablet Take one tablet by mouth every morning.      gabapentin (NEURONTIN) 300 mg capsule Take one capsule by mouth three times daily.      HUMALOG KWIKPEN INSULIN 100 unit/mL subcutaneous PEN Inject four units under the skin three times daily with meals. Correct for blood sugar over 220. BG 221-260: give 2 units. BG 261-300 give 4 units. BG 301-350: give 6 units. 351-400: 8 units. >400: 10 units 15 mL 0    hydrOXYzine HCL (ATARAX) 25 mg tablet Take one tablet by mouth every 6 hours as needed. Indications: anxious 30 tablet 0    L-Methylfolate (DEPLIN 15) 15 mg tablet Take one tablet by mouth daily.      levothyroxine (SYNTHROID) 25 mcg tablet Take one tablet by mouth daily 30 minutes before breakfast.      lipase-protease-amylase (PANCREAZE) 37,000-97,300- 149,900 unit capsule Take one capsule by mouth three times daily. Please use these instructions: Take two capsules WITH each meal, and one capsule with each snack that contains fat or protein. May have three snacks daily. 270 capsule 3    lubiprostone (AMITIZA) 24 mcg capsule Take one capsule by mouth twice daily with meals. Take with food and water. 60 capsule 0    NIFEDIPINE/LIDOCAINE 0.3%/1.5% IN PETROLATUM OINTMENT (BATCHED COMPOUND) Apply to anal area three times daily as needed 45 g 3    nortriptyline (PAMELOR) 50 mg capsule Take one capsule by mouth at bedtime daily. 30 capsule 0    ONETOUCH DELICA PLUS LANCET 33 gauge USE TO TEST BLOOD SUGAR 4 TIMES PER DAY.      pantoprazole DR (PROTONIX) 40 mg tablet TAKE 1 TABLET BY MOUTH TWICE A DAY 180 tablet 3    pramoxine-hydrocortisone (ANALPRAM-HC) 1-1 %  rectal cream Insert or Apply one g to rectal area as directed twice daily. 30 g 1    rosuvastatin (CRESTOR) 40 mg tablet Take one tablet by mouth daily.      STELARA 90 mg/mL syringe INJECT 1 SYRINGE SUBCUTANEOUSLY  EVERY 4 WEEKS 1 mL 3    TRESIBA FLEXTOUCH U-200 200 unit/mL (3 mL) injection PEN INJECT 50 UNITS SUBCUTANEOUSLY DAILY      VASCEPA 1 gram capsule TAKE 2 CAPSULES BY MOUTH TWICE A DAY         PSH:  Surgical History:   Procedure Laterality Date    HX CHOLECYSTECTOMY  05/2019    Colonoscopy N/A 11/13/2019    Performed by Buckles, Vinnie Level, MD at [facility] OR    ESOPHAGOGASTRODUODENOSCOPY WITH BIOPSY - FLEXIBLE N/A 11/13/2019    Performed by Buckles, Vinnie Level, MD at [facility] OR    Colonoscopy N/A 12/31/2020    Performed by Buckles, Vinnie Level, MD at [facility] OR    ESOPHAGOGASTRODUODENOSCOPY WITH BIOPSY - FLEXIBLE N/A 12/31/2020    Performed by Buckles, Vinnie Level, MD at [facility] OR    BREATH HYDROGEN/ METHANE TESTING - LACTULOSE N/A 03/16/2022    Performed by Tommie Sams, MD at [facility] ENDO    ESOPHAGEAL MOTILITY STUDY N/A 03/16/2022    Performed by Tommie Sams, MD at [facility] ENDO    ESOPHAGOGASTRODUODENOSCOPY WITH DILATION GASTRIC/ DUODENAL STRICTURE - FLEXIBLE N/A 03/18/2022    Performed by Buckles, Vinnie Level, MD at [facility] ENDO    COLONOSCOPY DIAGNOSTIC WITH SPECIMEN COLLECTION BY BRUSHING/ WASHING - FLEXIBLE N/A 03/18/2022    Performed by Buckles, Vinnie Level, MD at [facility] ENDO    ESOPHAGOGASTRODUODENOSCOPY WITH BIOPSY - FLEXIBLE N/A 03/18/2022    Performed by Buckles, Vinnie Level, MD at [facility] ENDO    COLONOSCOPY WITH BIOPSY - FLEXIBLE 03/18/2022    Performed by Buckles, Vinnie Level, MD at [facility] ENDO    SIGMOIDOSCOPY DIAGNOSTIC WITH COLLECTION SPECIMEN BY BRUSHING/ WASHING - FLEXIBLE N/A 09/11/2022    Performed by Buckles, Vinnie Level, MD at [facility] OR    ESOPHAGOGASTRODUODENOSCOPY WITH BIOPSY - FLEXIBLE N/A 05/12/2023    Performed by Tommie Sams, MD at [facility] ENDO    COLONOSCOPY      HX SHOULDER SURGERY      HX VASECTOMY         SH:  Social History     Socioeconomic History    Marital status: Married   Tobacco Use    Smoking status: Every Day     Current packs/day: 0.50     Average packs/day: 0.9 packs/day for 25.0 years (22.5 ttl pk-yrs)     Types: Cigarettes    Smokeless tobacco: Former     Types: Snuff, Chew     Quit date: 06/29/1997   Vaping Use    Vaping status: Never Used   Substance and Sexual Activity    Alcohol use: Not Currently     Comment: Havent had a drink in three years    Drug use: Yes     Types: Marijuana    Sexual activity: Yes     Partners: Female     Birth control/protection: None       FH:  Family History   Problem Relation Name Age of Onset    None Reported Mother      Cancer-Lung Maternal Uncle Uncle     Cancer Paternal Uncle      Cancer-Prostate Paternal Carloyn Jaeger     Cancer  Maternal Grandmother Both grandfathers     Alcohol liver disease Maternal Grandfather Grandpa     Cancer Maternal Grandfather Grandpa     Cancer Paternal Grandfather All men on Utt side of family            Physical Exam:  Vitals:    06/02/23 0100 06/02/23 0433   BP: 111/85 117/83   BP Source: Arm, Left Upper Arm, Left Upper   Pulse: 79 85   Temp: 36.5 ?C (97.7 ?F) 36.6 ?C (97.9 ?F)   SpO2: 98% 98%   O2 Device: None (Room air) None (Room air)   Weight: 93 kg (205 lb 0.4 oz)    Height: 175.3 cm (5' 9)        General: in no acute distress.  HEENT: Anicteric sclera.  Neck: Supple, trachea midline.   Heart: Appears well perfused  Lungs: Breathing is non-labored on room air.  Abdomen: Soft, non-distended. Non-tender to palpation. No guarding, peritoneal signs, or rebound tenderness.   Extremities:  Minimal edema.   Skin: non-jaundiced  Neuro: No focal neurologic deficit  Psych: Appropriate affect.    Laboratory:  Lab Results   Component Value Date    WBC 9.50 06/02/2023    HGB 15.4 06/02/2023    HCT 44.6 06/02/2023    MCV 88.2 06/02/2023     No components found for: SEDRATE  Lab Results   Component Value Date    CRP 0.92 12/16/2021     No components found for: RETICCTPCT  No results found for: IRON, TIBC, IRONSAT, FERRITINNo components found for: VITAMINB12No components found for: FOLATENo results found for: Palma Holter, TTIGG  Lab Results   Component Value Date    HPYLORIAG NEGATIVE 09/11/2021     Lab Results   Component Value Date    NA 136 (L) 06/02/2023    K 3.5 06/02/2023    CO2 22 06/02/2023    CL 104 06/02/2023    BUN 11 06/02/2023     No results found for: AMYLASENo results found for: LIPASE  Lab Results   Component Value Date    TOTBILI 4.9 (H) 06/02/2023    ALBUMIN 3.7 06/02/2023    AST 98 (H) 06/02/2023    ALT 214 (H) 06/02/2023     Lab Results   Component Value Date    INR 0.9 12/17/2020     No results found for: HEPAIGM, HEPBCORAB, HEPBCIGM, HEPBSAB, HEPBSAG, HEPCVAB, HCECPCR  No results found for: AMASC, AMATR, ANASCR, ANATITER, ASMSC, ASMTR, LIVERKIDNEY, SLAAUTOAB, TOTIGG, SLAAGAB      Imaging:   TILT TABLE EVALUATION  Head up tilt table test negative for pre syncope or syncope.  BP and HR response with in normal limits.  Leg heaviness and weakness reported with no objective findings.  Nitroglycerin challenge was not given.    No evidence of orthostatic hypotension  No evidence of postural orthostatic tachycardia

## 2023-06-02 NOTE — Progress Notes
BH 53 END OF SHIFT/ JHFRAT NOTE    Admission Date: 06/02/2023  Length of Stay: LOS: 0 days    Acute events, interventions, provider communication:     Patient Interventions and Education  Fall Risk/JHFRAT Interventions and Education: (Charting when applicable)   Total Fall Risk Score: Total Fall Risk Score: 7.   Elimination Interventions : Place male/male urinal within reach   Medications : Stay within arm's reach during toileting/showering (i.e., dizziness, orthostasis) , Educate patient on medication side effects, and Bed/chair alarm (i.e., change in mental status)   Patient Care Equipment: N/A  Mobility: Assist x1 and Ensure the use of corrective lens and/or hearing aides are in place prior to ambulation   Cognition: N/A  Risk for Moderate/Major Injury: N/A  2. BMAT: Bedside Mobility Assessment Tool (BMAT)  Sit and Shake: Pass  Extend and Point: Pass  Stand: Pass  Step: Pass (Green: walk without assistive device)    Restraints:  No       See Docflowsheet for restraint documentation, interventions, education, etc.    Activity:  Mobility: good    If other, explain:     Hygiene:                 Urinary Catheter / Perineal Care: Self    Intake and Output:           Last Bowel Movement Date:  (PTA),           Date 06/01/23 0701 - 06/02/23 0700(Not Admitted) 06/02/23 0701 - 06/03/23 0700   Shift 0701-1900 1901-0700 24 Hour Total 0701-1900 1901-0700 24 Hour Total   INTAKE   P.O.  0 0      Shift Total(mL/kg)  0(0) 0(0)      OUTPUT   Urine  240(0.2) 240(0.1)        Urine  240 240      Other           Stool Occurrence  0 x 0 x        Urine Occurrence   2 x 2 x      Shift Total(mL/kg)  240(2.6) 240(2.6)      NET  -240 -240      Weight (kg)  93 93 93 93 93

## 2023-06-02 NOTE — Case Management (ED)
Discharge Planning Screen      Primary Case Management Team:   Social Work: Scherrie Merritts (on [place])  Nurse Case Manager: Lilli Few, Phone [phone] (or Amie Critchley)  After-Hours: Please dial hospital operator and request On-Call Case Manager    Initial Huddle Date or Communication with Physician Team: 06/02/2023  Expected Discharge Date: 06/04/2023   Is Patient Medically Stable: No, Please explain: ongoing workup for elevated LFTs.  -------------------------------------------------------------------------------------------------------------------------    Patient was admitted for elevated LFTs on 06/02/2023.    Using sources of EMR, RN staff, and/or huddle discussion and the criteria below, patient was screened for potential discharge planning needs:    Team anticipates post-acute care needs that will exceed patient/caregiver capacity to arrange or manage independently: No  If Yes, full CM assessment is required.    Prior to this episode of care (including at outside hospital), patient's residence was: Family home    Patient is alert and oriented, and likely to remain that way at discharge: Yes   If No, patient  (a) Is at cognitive baseline, (b) has an established caregiver, and (c) home routine is unlikely to change at discharge: N/A    If any answer to (a), (b), or (c) is No, full assessment is required.    Patient is independent in functioning, and likely to remain that way at discharge: Yes   If No, patient   (a) Is at functional baseline, (b) has an established caregiver or adequate assistance, and (c) home routine is unlikely to change at discharge: N/A  If any answer to (a), (b), or (c) is No, full assessment is required.    PSYCHOSOCIAL NEEDS:  If the patient has unstable psychosocial needs and has not had full CM assessment in the past 30 days, full assessment is required.    CM spoke with or attempted to speak with patient today to determine willingness to address behavioral/social/domestic need: N/A  Outcomes of intervention:   The patient is open to addressing these needs during this admission: No.   Further results were: N/A.    Other Case Management Notes: Patient presented as a transfer from OSH for elevated LFTs. Hepatology following and recommending follow up on TTE. Possible liver biopsy and EUS/ERCP. No anticipated discharge NCM needs. Will continue to follow.    Plan: Likely discharge to home without Case Management needs. Case Management Team will continue to monitor daily for development of needs. Should Case Management needs arise, please call or Voalte Case Management Team noted above. If after hours or on the weekend, please dial hospital operator and request the on-call Case Manager.    Lilli Few, BSN, RN  Med Private R Nurse Case Manager  [phone] and available on [place]

## 2023-06-02 NOTE — Care Coordination-Inpatient
Med Private Night 8 - [id number] will take calls on this patient until 8:00 AM.        Lilia Argue, M.D.  AOD

## 2023-06-03 ENCOUNTER — Inpatient Hospital Stay: Admit: 2023-06-03 | Discharge: 2023-06-03 | Payer: Private Health Insurance - Indemnity

## 2023-06-03 LAB — POC GLUCOSE
~~LOC~~ BKR POC GLUCOSE: 96 mg/dL (ref 70–100)
~~LOC~~ BKR POC GLUCOSE: 103 mg/dL — ABNORMAL HIGH (ref 70–100)
~~LOC~~ BKR POC GLUCOSE: 158 mg/dL — ABNORMAL HIGH (ref 70–100)
~~LOC~~ BKR POC GLUCOSE: 188 mg/dL — ABNORMAL HIGH (ref 70–100)

## 2023-06-03 LAB — ENDOSCOPIC ULTRASOUND REPORT

## 2023-06-03 LAB — HEMOGLOBIN A1C: ~~LOC~~ BKR HEMOGLOBIN A1C: 9.8 % — ABNORMAL HIGH (ref 4.0–5.7)

## 2023-06-03 LAB — EPSTEIN BARR  PANEL(EBV)
~~LOC~~ BKR EBV EARLY AG, IGG: NEGATIVE U/mL
~~LOC~~ BKR EBV NUCLEAR AG, IGG: POSITIVE U/mL — AB

## 2023-06-03 LAB — COMPREHENSIVE METABOLIC PANEL
~~LOC~~ BKR CHLORIDE: 99 mmol/L (ref 98–110)
~~LOC~~ BKR POTASSIUM: 4.2 mmol/L (ref 3.5–5.1)

## 2023-06-03 LAB — CBC AND DIFF: ~~LOC~~ BKR RDW: 15 % — ABNORMAL HIGH (ref 11.0–15.0)

## 2023-06-03 LAB — CMV QUANT PCR-BLOOD

## 2023-06-03 LAB — ANTI-NUCLEAR ANTIBODY(ANA): ~~LOC~~ BKR ANTI-NUCLR AG,SCREEN: <80 mg/dL (ref <80)

## 2023-06-03 MED ORDER — ENOXAPARIN 40 MG/0.4 ML SC SYRG
40 mg | Freq: Every day | SUBCUTANEOUS | 0 refills | Status: DC
Start: 2023-06-03 — End: 2023-06-17
  Administered 2023-06-04 – 2023-06-16 (×11): 40 mg via SUBCUTANEOUS

## 2023-06-03 MED ADMIN — PROPOFOL 10 MG/ML IV EMUL [11150]: 30 ug/kg/min | INTRAVENOUS | @ 02:00:00 | Stop: 2023-06-03 | NDC 00409469953

## 2023-06-03 NOTE — Progress Notes
BH 53 END OF SHIFT/ JHFRAT NOTE    Admission Date: 06/02/2023  Length of Stay: LOS: 1 day    Acute events, interventions, provider communication: No  acute events throughout shift or new concerns at this time. VSS throughout shift. After reassessment and discussion with patient, patient now scored low Fall risk so bed alarm has been disarmed.     Patient Interventions and Education  Fall Risk/JHFRAT Interventions and Education: (Charting when applicable)   Total Fall Risk Score: Total Fall Risk Score: 5.   Elimination Interventions : N/A  Medications : Educate patient on medication side effects and Bed/chair alarm (i.e., change in mental status)   Patient Care Equipment: N/A  Mobility: Assist x1  Cognition: N/A  Risk for Moderate/Major Injury: Surgery/procedure requiring anesthesia within past 24 hours  2. BMAT: Bedside Mobility Assessment Tool (BMAT)  Sit and Shake: Pass  Extend and Point: Pass  Stand: Pass  Step: Fail (Yellow: stand, transfer and/or walk assisted), stop assessment    Restraints:  No       See Docflowsheet for restraint documentation, interventions, education, etc.    Activity:  Mobility: good    If other, explain:     Hygiene:     Bath/Shower: Soap and water shower/bath     Oral Care: Brush, Moisturize     Urinary Catheter / Perineal Care: Self    Intake and Output:           Last Bowel Movement Date:  (PTA),           Date 06/02/23 1901 - 06/03/23 0700 06/03/23 0701 - 06/04/23 0700   Shift 1901-0700 24 Hour Total 0701-1900 1901-0700 24 Hour Total   INTAKE   P.O. 840 940      Shift Total(mL/kg) 840(9) 940(10.1)      OUTPUT   Urine(mL/kg/hr)  100        Urine  100      Other          Stool Occurrence 0 x 0 x 0 x  0 x     Urine Occurrence  2 x 2 x 1 x  1 x   Shift Total(mL/kg)  100(1.1)      NET 840 840      Weight (kg) 93 93 93 93 93

## 2023-06-03 NOTE — Care Plan
Brief hepatology note:    Patient underwent EUS and ERCP yesterday.  Did have mild stricture at GE junction which was dilated.  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  ERCP was relatively normal without filling defect and a 5 mm CBD.  There were no stones or sludge.    This a.m., did have mild increase in total bilirubin and alkaline phosphatase, but relatively unchanged AST and ALT.this was likely in the setting of ERCP.  Hemoglobin A1c of 9.8.  BNP normal, TTE unremarkable.  CMV, EBV unremarkable.  CRP of 1.0 from previous 0.92.  Iron profile unremarkable.    Recommendations:  --Hold further Augmentin  --Continue to trend CMP daily  -- follow-up alpha-1 antitrypsin, ANA, ASMA, AMA, HSV quant  --No plans for liver biopsy currently, will follow-up path from lymph node biopsy  -- Diet per primary  -- Obtain fecal calprotectin if able (patient lives further away and outpatient GI will have available for upcoming appointment)

## 2023-06-03 NOTE — Progress Notes
General Progress Note    Admission Date: 06/02/2023       LOS: 1 day    Date of Service: 06/03/2023    Assessment/Plan:      Alan [name] is a 42 y.o. male. With past medical history of crohn's disease, diabetes, GAD, fibromyalgia presented to [facility] as a transfer from outside hospital for elevated LFTs and itching.      Acute Liver Injury, improving  Pruritus  -AST of 132, ALT 320, ALP 325, TB of 5.1 at OSH  -Hepatitis panel negative  -CT Abdomen at OSH shows mild esophagitis, small pericardial effusion, mild atrophic pancreas with faint calcifications due to chronic pancreatitis   -RUQ U/S: Prior cholecystectomy with dilated CBD.  Mild diffuse parenchymal hepatic disease, mild splenomegaly  -Ddx: DILI d/t Augmentin vs biliary pathology (I.e. PSC) 2/2 Crohn's Disease  Plan  -Consult Hepatology  -ERCP without significant abnormalities  -D/c Augmentin  -Obtain further lab work: BNP, EBV panel, HSV serum PCR, CMV IgG/IgM with serum PCR, iron profile, alpha-1 antitrypsin, ANA, AMA, ASMA, acute hepatitis panel, CRP  -Obtain fecal calprotectin if able (patient lives further away and outpatient GI will have available for upcoming appointment)  -LFTs with slight bump, to be expected with ERCP, will continue to monitor daily.     Small Pericardial Effusion  -Noted incidentally on CT A/P at OSH  -No chest pain, has diffuse pain due to fibromyalgia history  Plan  -Obtain TTE, overall unremarkable exam     Chronic Pancreatitis  -Continue home pancreatic enzymes supplements     Upper Respiratory Tract Symptoms for the past one week/Sinusitis  -Started on Augmentin, holding due to ALI as above  Plan  -Augmentin, received 5 days of treatment so probably does not need anymore anyway  -Obtain chest x-ray due to continued cough, no acute cardiopulmonary abnomalities     Crohn's disease history  -Follows up with GI. Denies current black/bloody bowel movements  Plan  -Currently on Stelera every 4 weeks  -Outpatient GI follow up T2DM  -Last A1c 9.1 (06/05/2021)   -PTA on Tresiba 30 units BID, 4 units TIDWM  Plan  -Down-titrated insulin to glargine 15 units BID, aspart LDCF given NPO status and poor PO intake  -Re-uptitrate insulin back to PTA doses as able  -Update A1c, result pending    Hypothyroidism  -Continue home levothyroxine     Anxiety Disorder  -Continue home Xanax PRN  -Continue nor tryptialine    FEN: diabetic diet  DVT ppx: SCDs, restart lovenox  Code Status: FULL CODE    I spent >50 minutes total time in patient care today, including reviewing the patient's chart, interviewing and examining the patient, counseling the patient on the plan, coordinating the patient's care, and documenting the patient encounter.    [name]  Internal Medicine  [facility]    ________________________________________________________________________    Subjective  Overnight, there were no acute events. Reports that his appetite is worse today, continues to have itching. Happy to hear updates and plans to continue following LFTs. No other acute concerns or complaints at this time.    Medications  Scheduled Meds:gabapentin (NEURONTIN) capsule 300 mg, 300 mg, Oral, TID  insulin aspart (U-100) (NOVOLOG FLEXPEN U-100 INSULIN) injection PEN 0-6 Units, 0-6 Units, Subcutaneous, ACHS (22)  insulin glargine (LANTUS SOLOSTAR U-100 INSULIN) injection PEN 15 Units, 15 Units, Subcutaneous, BID  levothyroxine (SYNTHROID) tablet 25 mcg, 25 mcg, Oral, QDAY 30 min before breakfast  lipase-protease-amylase (ZENPEP 40) capsule 1  capsule, 1 capsule, Oral, TID w/ meals  nortriptyline (PAMELOR) capsule 50 mg, 50 mg, Oral, QHS    Continuous Infusions:  PRN and Respiratory Meds:ALPRAZolam TID PRN, dextrose 50% (D50) IV PRN, fluticasone propionate BID PRN, hydrOXYzine HCL TID PRN, melatonin QHS PRN, morphine Q6H PRN, ondansetron Q6H PRN **OR** ondansetron (ZOFRAN) IV Q6H PRN, polyethylene glycol 3350 QDAY PRN, sennosides-docusate sodium QDAY PRN      Review of Systems:  All other systems reviewed and are negative.    Objective:                          Vital Signs: Last Filed                 Vital Signs: 24 Hour Range   BP: 113/78 (12/05 1111)  Temp: 36.8 ?C (98.2 ?F) (12/05 1111)  Pulse: 93 (12/05 1111)  Respirations: 18 PER MINUTE (12/05 1111)  SpO2: 97 % (12/05 1111)  O2 Device: None (Room air) (12/05 1111) BP: (105-125)/(73-99)   Temp:  [36.3 ?C (97.4 ?F)-36.8 ?C (98.2 ?F)]   Pulse:  [93-108]   Respirations:  [15 PER MINUTE-28 PER MINUTE]   SpO2:  [92 %-99 %]   O2 Device: None (Room air)   Intensity Pain Scale (Self Report): 5 (06/03/23 0014) Vitals:    06/02/23 0100 06/02/23 1113   Weight: 93 kg (205 lb 0.4 oz) 93 kg (205 lb 0.4 oz)           Physical Exam    General: awake & oriented, no acute distress  Head: normocephalic, atraumatic  CV: regular rhythm, no murmur/rub/gallop  Lungs: clear to ausculation bilaterally, no wheezes/rhonchi/crackles  Abdomen: soft, non-tender, non-distended, normo-active bowel sounds  Extremities: no clubbing/cyanosis/edema  Neuro: no focal deficits    Lab Review  Pertinent labs reviewed    Point of Care Testing  (Last 24 hours)  Glucose: (!) 226 (06/03/23 0559)  POC Glucose (Download): (!) 158 (06/03/23 1111)    Radiology and other Diagnostics Review:    Pertinent radiology reviewed.

## 2023-06-03 NOTE — Progress Notes
[facility] END OF SHIFT/ JHFRAT NOTE    Admission Date: 06/02/2023  Length of Stay: LOS: 1 day    Acute events, interventions, provider communication:     Patient Interventions and Education  Fall Risk/JHFRAT Interventions and Education: (Charting when applicable)   Total Fall Risk Score: Total Fall Risk Score: 9.   Elimination Interventions : Place male/male urinal within reach   Medications : Stay within arm's reach during toileting/showering (i.e., dizziness, orthostasis) , Educate patient on medication side effects, and Bed/chair alarm (i.e., change in mental status)   Patient Care Equipment: Does not need assistance with patient care equipment when ambulating and Ensure environment is free of clutter and walkways are clear from tripping hazards  Mobility: Assist x1 and Ensure the use of corrective lens and/or hearing aides are in place prior to ambulation   Cognition: Bed/Chair Alarm   Risk for Moderate/Major Injury: Surgery/procedure requiring anesthesia within past 24 hours  2. BMAT: Bedside Mobility Assessment Tool (BMAT)  Sit and Shake: Pass  Extend and Point: Pass  Stand: Pass  Step: Pass (Green: walk without assistive device)    Restraints:  No       See Docflowsheet for restraint documentation, interventions, education, etc.    Activity:  Mobility: good    If other, explain:     Hygiene:                 Urinary Catheter / Perineal Care: Self    Intake and Output:           Last Bowel Movement Date:  (PTA),           Date 06/02/23 0701 - 06/03/23 0700 06/03/23 0701 - 06/04/23 0700   Shift 0701-1900 1901-0700 24 Hour Total 0701-1900 1901-0700 24 Hour Total   INTAKE   P.O. 100 840 940      Shift Total(mL/kg) 100(1.1) 840(9) 940(10.1)      OUTPUT   Urine(mL/kg/hr) 100(0.1)  100        Urine 100  100      Other           Stool Occurrence  0 x 0 x        Urine Occurrence   2 x 2 x      Shift Total(mL/kg) 100(1.1)  100(1.1)      NET 0 840 840      Weight (kg) 93 93 93 93 93 93

## 2023-06-03 NOTE — Anesthesia Pain Rounding
Anesthesia Follow-Up Evaluation: Post-Procedure Day One    Name: Alan [name]     MRN: [id number]     DOB: [birth year]     Age: 42 y.o.     Sex: male   __________________________________________________________________________     Procedure Date: 06/02/2023   Procedure: Procedure(s):  ESOPHAGOGASTRODUODENOSCOPY WITH TRANSENDOSCOPIC ULTRASOUND GUIDED INTRAMURAL/ TRANSMURAL FINE NEEDLE ASPIRATION/ BIOPSY - FLEXIBLE  ENDOSCOPIC RETROGRADE CHOLANGIOPANCREATOGRAPHY WITH SPHINCTEROTOMY/ PAPILLOTOMY  ESOPHAGOGASTRODUODENOSCOPY, WITH BALLOON DILATION OF LESS THAN 30 MILLIMETERS    Physical Assessment  Height: 175.3 cm (5' 9.02)  Weight: 93 kg (205 lb 0.4 oz)    Vital Signs (Last Filed in 24 hours)  BP: 124/75 (12/05 0336)  Temp: 36.6 ?C (97.8 ?F) (12/05 3086)  Pulse: 100 (12/05 0336)  Respirations: 17 PER MINUTE (12/05 0336)  SpO2: 97 % (12/05 0336)  O2 Device: None (Room air) (12/05 0336)  Height: 175.3 cm (5' 9.02) (12/04 1113)    Patient History   Allergies  Allergies   Allergen Reactions    Iodinated Contrast Media FLUSHING (SKIN) and REDNESS    Iodine And Iodide Containing Products RASH    Azathioprine RASH    Chlorhexidine RASH    Chlorphen-Phenyleph-Hydrocodon ITCHING    Gadolinium-Containing Contrast Media FLUSHING (SKIN)     Pt reported has had reaction in past x2 at other location. Pt reports was given benadryl with 2nd reaction.       Oxycodone ITCHING        Medications  Scheduled Meds:gabapentin (NEURONTIN) capsule 300 mg, 300 mg, Oral, TID  insulin aspart (U-100) (NOVOLOG FLEXPEN U-100 INSULIN) injection PEN 0-6 Units, 0-6 Units, Subcutaneous, ACHS (22)  insulin glargine (LANTUS SOLOSTAR U-100 INSULIN) injection PEN 15 Units, 15 Units, Subcutaneous, BID  levothyroxine (SYNTHROID) tablet 25 mcg, 25 mcg, Oral, QDAY 30 min before breakfast  lipase-protease-amylase (ZENPEP 40) capsule 1 capsule, 1 capsule, Oral, TID w/ meals  nortriptyline (PAMELOR) capsule 50 mg, 50 mg, Oral, QHS    Continuous Infusions:  PRN and Respiratory Meds:ALPRAZolam TID PRN, dextrose 50% (D50) IV PRN, fluticasone propionate BID PRN, hydrOXYzine HCL TID PRN, melatonin QHS PRN, morphine Q6H PRN, ondansetron Q6H PRN **OR** ondansetron (ZOFRAN) IV Q6H PRN, polyethylene glycol 3350 QDAY PRN, sennosides-docusate sodium QDAY PRN      Diagnostic Tests  Hematology:   Lab Results   Component Value Date    HGB 15.4 06/03/2023    HGB 16.8 01/08/2023    HCT 45.7 06/03/2023    HCT 48.5 01/08/2023    PLTCT 285 06/03/2023    PLTCT 330 01/08/2023    WBC 13.00 06/03/2023    WBC 17.94 01/08/2023    NEUT 87 06/03/2023    NEUT 69.7 01/08/2023    ANC 11.30 06/03/2023    ANC 12.50 01/08/2023    ALC 0.90 06/03/2023    ALC 3.74 01/08/2023    MONA 5 06/03/2023    MONA 7.2 01/08/2023    AMC 0.60 06/03/2023    AMC 1.30 01/08/2023    EOSA 1 06/03/2023    EOSA 4 06/03/2021    ABC 0.10 06/03/2023    ABC 0.10 01/08/2023    MCV 87.9 06/03/2023    MCV 86.3 01/08/2023    MCH 29.6 06/03/2023    MCH 29.9 01/08/2023    MCHC 33.7 06/03/2023    MCHC 34.6 01/08/2023    MPV 7.5 06/03/2023    MPV 8.6 01/08/2023    RDW 15.1 06/03/2023    RDW 14.7 01/08/2023  General Chemistry:   Lab Results   Component Value Date    NA 133 06/03/2023    NA 134 12/16/2021    K 4.2 06/03/2023    K 4.0 12/16/2021    CL 99 06/03/2023    CL 102 12/16/2021    CO2 23 06/03/2023    CO2 23 12/16/2021    GAP 11 06/03/2023    GAP 9 12/16/2021    BUN 12 06/03/2023    BUN 14 12/16/2021    CR 0.81 06/03/2023    CR 0.81 12/16/2021    GLU 226 06/03/2023    GLU 274 12/16/2021    CA 8.7 06/03/2023    CA 9.1 12/16/2021    ALBUMIN 3.7 06/03/2023    ALBUMIN 4.1 12/16/2021    MG 1.9 12/17/2020    TOTBILI 6.5 06/03/2023    TOTBILI 0.3 12/16/2021      Coagulation:   Lab Results   Component Value Date    PT 10.5 12/17/2020    PTT 31.4 12/17/2020    INR 0.9 12/17/2020         Follow-Up Assessment  Patient location during evaluation: floor      Anesthetic Complications:   Anesthetic complications: The patient did not experience any anesthestic complications.      Pain:    Management:adequate     Level of Consciousness: awake and alert   Hydration:acceptable     Airway Patency: patent   Respiratory Status: acceptable     Cardiovascular Status:acceptable   Regional/Neuroaxial:

## 2023-06-04 ENCOUNTER — Encounter: Admit: 2023-06-04 | Discharge: 2023-06-04 | Payer: Private Health Insurance - Indemnity

## 2023-06-04 LAB — ALPHA 1 ANTITRYPSIN PHENOTYPE

## 2023-06-04 LAB — POC GLUCOSE
~~LOC~~ BKR POC GLUCOSE: 203 mg/dL — ABNORMAL HIGH (ref 70–100)
~~LOC~~ BKR POC GLUCOSE: 216 mg/dL — ABNORMAL HIGH (ref 70–100)
~~LOC~~ BKR POC GLUCOSE: 134 mg/dL — ABNORMAL HIGH (ref 70–100)
~~LOC~~ BKR POC GLUCOSE: 151 mg/dL — ABNORMAL HIGH (ref 70–100)

## 2023-06-04 LAB — HERPES SIMPLEX VIRUS 1 & 2 QUANTITATIVE REAL-TIME PCR. PLASMA

## 2023-06-04 MED ORDER — LIPASE-PROTEASE-AMYLASE (ZENPEP 40) 40,000-126,000- 168,000 UNIT PO CPDR
2 | Freq: Three times a day (TID) | ORAL | 0 refills | Status: DC
Start: 2023-06-04 — End: 2023-06-17
  Administered 2023-06-05 – 2023-06-17 (×22): 2 via ORAL

## 2023-06-04 MED ORDER — LIPASE-PROTEASE-AMYLASE (ZENPEP 40) 40,000-126,000- 168,000 UNIT PO CPDR
1 | ORAL | 0 refills | Status: DC
Start: 2023-06-04 — End: 2023-06-17
  Administered 2023-06-04 – 2023-06-17 (×9): 1 via ORAL

## 2023-06-04 MED ORDER — MAGNESIUM HYDROXIDE 400 MG/5 ML PO SUSP
30 mL | Freq: Once | ORAL | 0 refills | Status: CP
Start: 2023-06-04 — End: ?
  Administered 2023-06-05: 02:00:00 30 mL via ORAL

## 2023-06-04 NOTE — Progress Notes
[facility] 53 END OF SHIFT/ JHFRAT NOTE    Admission Date: 06/02/2023  Length of Stay: LOS: 2 days    Acute events, interventions, provider communication: No acute events throughout shift or new concerns at this time. Vital signs stable throughout shift.     Patient Interventions and Education  Fall Risk/JHFRAT Interventions and Education: (Charting when applicable)   Total Fall Risk Score: Total Fall Risk Score: 5.   Elimination Interventions : N/A  Medications : N/A  Patient Care Equipment: N/A  Mobility: N/A  Cognition: N/A  Risk for Moderate/Major Injury: N/A  2. BMAT: Bedside Mobility Assessment Tool (BMAT)  Sit and Shake: Pass  Extend and Point: Pass  Stand: Pass  Step: Pass (Green: walk without assistive device)    Restraints:  No       See Docflowsheet for restraint documentation, interventions, education, etc.    Activity:  Mobility: good    If other, explain:     Hygiene:     Bath/Shower: Soap and water shower/bath     Oral Care: Brush, Moisturize     Urinary Catheter / Perineal Care: Self    Intake and Output:           Last Bowel Movement Date:  (PTA),           Date 06/03/23 0701 - 06/04/23 0700 06/04/23 0701 - 06/05/23 0700   Shift 0701-1900 1901-0700 24 Hour Total 0701-1900 1901-0700 24 Hour Total   INTAKE   P.O.  50 50      Shift Total(mL/kg)  50(0.5) 50(0.5)      OUTPUT   Other           Stool Occurrence 0 x 0 x 0 x        Urine Occurrence  1 x 0 x 1 x      Shift Total(mL/kg)         NET  50 50      Weight (kg) 93 93 93 93 93 93

## 2023-06-04 NOTE — Progress Notes
[facility] END OF SHIFT/ JHFRAT NOTE    Admission Date: 06/02/2023  Length of Stay: LOS: 2 days    Acute events, interventions, provider communication: No acute events throughout shift. VSS. No new concerns at this time. Care ongoing.     Patient Interventions and Education  Fall Risk/JHFRAT Interventions and Education: (Charting when applicable)   Total Fall Risk Score: Total Fall Risk Score: 5.   Elimination Interventions : N/A  Medications : N/A  Patient Care Equipment: Ensure environment is free of clutter and walkways are clear from tripping hazards  Mobility: N/A  Cognition: N/A  Risk for Moderate/Major Injury: N/A  2. BMAT: Bedside Mobility Assessment Tool (BMAT)  Sit and Shake: Pass  Extend and Point: Pass  Stand: Pass  Step: Pass (Green: walk without assistive device)    Restraints:  No       See Docflowsheet for restraint documentation, interventions, education, etc.    Activity:  Mobility: good    If other, explain:     Hygiene:     Bath/Shower: Soap and water shower/bath     Oral Care: Brush, Moisturize     Urinary Catheter / Perineal Care: Self    Intake and Output:           Last Bowel Movement Date:  (PTA),           Date 06/03/23 0701 - 06/04/23 0700 06/04/23 0701 - 06/05/23 0700   Shift 0701-1900 1901-0700 24 Hour Total 0701-1900 1901-0700 24 Hour Total   INTAKE   P.O.  50 50      Shift Total(mL/kg)  50(0.5) 50(0.5)      OUTPUT   Other           Stool Occurrence 0 x 0 x 0 x 0 x  0 x     Urine Occurrence  1 x 0 x 1 x 4 x  4 x   Shift Total(mL/kg)         NET  50 50      Weight (kg) 93 93 93 93 93 93

## 2023-06-04 NOTE — Progress Notes
General Progress Note    Admission Date: 06/02/2023       LOS: 2 days    Date of Service: 06/04/2023    Assessment/Plan:      Alan [name] is a 42 y.o. male. With past medical history of crohn's disease, diabetes, GAD, fibromyalgia presented to [facility] as a transfer from outside hospital for elevated LFTs and itching.      Acute Liver Injury, improving  Pruritus  -AST of 132, ALT 320, ALP 325, TB of 5.1 at OSH  -Hepatitis panel negative  -CT Abdomen at OSH shows mild esophagitis, small pericardial effusion, mild atrophic pancreas with faint calcifications due to chronic pancreatitis   -RUQ U/S: Prior cholecystectomy with dilated CBD.  Mild diffuse parenchymal hepatic disease, mild splenomegaly  -Ddx: DILI d/t Augmentin vs biliary pathology (I.e. PSC) 2/2 Crohn's Disease.  -EBV panel w/ positive IgG and negative IgM. CMV negative. HSV not detected. Iron panel WNL. Acute hepatitis panel negative.     Plan  -Consulted Hepatology, they have now signed off.   -ERCP without significant abnormalities  -Obtain further lab work:  alpha-1 antitrypsin, ANA, AMA, ASMA.  -Obtain fecal calprotectin if able (patient lives further away and outpatient GI will have available for upcoming appointment). Discussed w/ patient.   -LFTs with slight bump, to be expected with ERCP, will continue to monitor daily.  -Will f/u path from FNA.      Trivial Pericardial Effusion  -Noted incidentally on CT A/P at OSH  -No chest pain, has diffuse pain due to fibromyalgia history  -TTE w/ trivial pericardial effusion     Chronic Pancreatitis  -Continue home pancreatic enzymes supplements     Upper Respiratory Tract Symptoms for the past one week/Sinusitis  -S/p 5 day course of Augmentin, CXR negative.      Crohn's disease history  -Follows up with GI. Denies current black/bloody bowel movements  Plan  -Currently on Stelera every 4 weeks  -Outpatient GI follow up     T2DM  -Last A1c 9.1 (06/05/2021); repeat 9.8% 06/03/23  -PTA on Tresiba 30 units BID, 4 units TIDWM  Plan  -Down-titrated insulin to glargine 15 units BID, aspart LDCF given NPO status and poor PO intake  -Re-uptitrate insulin back to PTA doses as able    Hypothyroidism  -Continue home levothyroxine     Anxiety Disorder  -Continue home Xanax PRN  -Continue nor tryptialine    FEN: diabetic diet  DVT ppx: SCDs, restart lovenox  Code Status: FULL CODE    I spent >50 minutes total time in patient care today, including reviewing the patient's chart, interviewing and examining the patient, counseling the patient on the plan, coordinating the patient's care, and documenting the patient encounter.    [name], MD      ________________________________________________________________________    Subjective  Overnight, there were no acute events.  We reviewed the results of his labs, and discussed that I would like to see his numbers improving prior to discharge.  He says that he continues to have a poor appetite, though plans to eat dinner tonight.  I encouraged him to work on increasing oral intake, and keeping up on fluid intake.  He denies new pain, and says that his chronic pancreatitis pain is stable and has not worsened.  He says that he has his chronic fibromyalgia pain as well.  He denies dyspnea at this time.  He says his urine is dark, in the setting of elevated bilirubin.  We discussed plan for continued lab monitoring, and all questions and concerns were addressed.  Discussed he still has a few labs pending at this time.    Medications  Scheduled Meds:enoxaparin (LOVENOX) syringe 40 mg, 40 mg, Subcutaneous, QDAY(21)  gabapentin (NEURONTIN) capsule 300 mg, 300 mg, Oral, TID  insulin aspart (U-100) (NOVOLOG FLEXPEN U-100 INSULIN) injection PEN 0-6 Units, 0-6 Units, Subcutaneous, ACHS (22)  insulin glargine (LANTUS SOLOSTAR U-100 INSULIN) injection PEN 15 Units, 15 Units, Subcutaneous, BID  levothyroxine (SYNTHROID) tablet 25 mcg, 25 mcg, Oral, QDAY 30 min before breakfast  lipase-protease-amylase (ZENPEP 40) capsule 1 capsule, 1 capsule, Oral, TID w/ meals  nortriptyline (PAMELOR) capsule 50 mg, 50 mg, Oral, QHS    Continuous Infusions:  PRN and Respiratory Meds:ALPRAZolam TID PRN, dextrose 50% (D50) IV PRN, fluticasone propionate BID PRN, hydrOXYzine HCL TID PRN, melatonin QHS PRN, morphine Q6H PRN, ondansetron Q6H PRN **OR** ondansetron (ZOFRAN) IV Q6H PRN, polyethylene glycol 3350 QDAY PRN, sennosides-docusate sodium QDAY PRN      Objective:                          Vital Signs: Last Filed                 Vital Signs: 24 Hour Range   BP: 100/62 (12/06 0745)  Temp: 36.8 ?C (98.2 ?F) (12/06 0745)  Pulse: 89 (12/06 0745)  Respirations: 16 PER MINUTE (12/06 0745)  SpO2: 97 % (12/06 0745)  O2 Device: None (Room air) (12/06 0745) BP: (100-117)/(62-78)   Temp:  [36.5 ?C (97.7 ?F)-37 ?C (98.6 ?F)]   Pulse:  [89-95]   Respirations:  [16 PER MINUTE-20 PER MINUTE]   SpO2:  [97 %-99 %]   O2 Device: None (Room air)     Vitals:    06/02/23 0100 06/02/23 1113   Weight: 93 kg (205 lb 0.4 oz) 93 kg (205 lb 0.4 oz)           Physical Exam    General: awake & oriented, no acute distress  Head: normocephalic, atraumatic  CV: regular rhythm, no murmur/rub/gallop  Lungs: clear to ausculation bilaterally, no wheezes/rhonchi/crackles  Abdomen: soft, non-tender, non-distended, normo-active bowel sounds  Extremities: no clubbing/cyanosis/edema  Neuro: no focal deficits    Lab Review  Pertinent labs reviewed    Point of Care Testing  (Last 24 hours)  Glucose: (!) 158 (06/04/23 0630)  POC Glucose (Download): (!) 151 (06/04/23 0827)    Radiology and other Diagnostics Review:    Pertinent radiology reviewed.

## 2023-06-04 NOTE — Care Plan
Brief hepatology note:    Patient underwent EUS and ERCP 12/4.  Did have mild stricture at GE junction which was dilated.  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  ERCP was relatively normal without filling defect and a 5 mm CBD.  There were no stones or sludge.     Hemoglobin A1c of 9.8.  BNP normal, TTE unremarkable.  CMV, EBV unremarkable.  CRP of 1.0 from previous 0.92.  Iron profile unremarkable  This morning LFTs similar to previous with alkaline phosphatase and AL T improving.  T. bili mildly up but is likely lagging and improvement over time.      Recommendations:  --Hold further Augmentin  --Continue to trend CMP daily  -- follow-up alpha-1 antitrypsin, ANA, ASMA, AMA  --No plans for liver biopsy currently, will follow-up path from lymph node biopsy  -- Diet per primary  -- Obtain fecal calprotectin if able (patient lives further away and outpatient GI will have available for upcoming appointment)    At this time, hepatology will sign off.  Recommend PCP obtaining CMP a week after discharge.  If elevated liver enzymes or autoimmune come back positive, recommend referral to hepatology.

## 2023-06-04 NOTE — Case Management (ED)
Case Management Progress Note    NAME:Alan [name]                          MRN: [id number]              DOB:June 08, 1981          AGE: 42 y.o.  ADMISSION DATE: 06/02/2023             DAYS ADMITTED: LOS: 2 days      Today's Date: 06/04/2023    PLAN: Patient anticipated to discharge home over the weekend with no identified NCM needs pending improvement in LFTs.    Expected Discharge Date: 06/05/2023   Is Patient Medically Stable: No, Please explain: monitoring LFTs.  Are there Barriers to Discharge? No    INTERVENTION/DISPOSITION:  Discharge Planning              Discharge Planning: No Needs Identified  NCM reviewed EMR.  NCM attended and participated in [facility] huddle.  Patient anticipated to be stable for discharge home over the weekend pending improvement in LFTs.  No identified NCM needs upon discharge.  Transportation                 Support              Support: Huddle/team update, No needs identified  Info or Referral              Information or Referral to [facility]: No Needs Identified  Positive SDOH Domains and Potential Barriers     Medication Needs              Medication Needs: No Needs Identified  Financial              Financial: No Needs Identified  Legal              Legal: No Needs Identified  Other              Other/None: No needs identified  Discharge Disposition                                           Selected Continued Care - Admitted Since 06/02/2023    No services have been selected for the patient.       Lilli Few, BSN, RN  Med [facility] R Nurse Case Manager  [phone] and available on [place]

## 2023-06-05 LAB — POC GLUCOSE
~~LOC~~ BKR POC GLUCOSE: 172 mg/dL — ABNORMAL HIGH (ref 70–100)
~~LOC~~ BKR POC GLUCOSE: 247 mg/dL — ABNORMAL HIGH (ref 70–100)
~~LOC~~ BKR POC GLUCOSE: 164 mg/dL — ABNORMAL HIGH (ref 70–100)
~~LOC~~ BKR POC GLUCOSE: 173 mg/dL — ABNORMAL HIGH (ref 70–100)
~~LOC~~ BKR POC GLUCOSE: 220 mg/dL — ABNORMAL HIGH (ref 70–100)

## 2023-06-05 LAB — ALPHA-1-ANTITRYPSIN, TOTAL: ~~LOC~~ BKR ALPHA 1 ANTITRYPSIN: 160 mg/dL (ref 90–200)

## 2023-06-05 LAB — MITOCHONDRIAL M2 ANTIBODY, IGG ELISA (AMAR): ~~LOC~~ BKR MITOCHON M2 AB IGG: 32 U — ABNORMAL HIGH (ref 0.0–24.9)

## 2023-06-05 LAB — F-ACTIN (SMOOTH MUSCLE) ANTIBODY, IGG BY ELISA W/ REFLEX TO SMA, IGG TITER (FAAB): ~~LOC~~ BKR F-ACTIN AB W/REFLEX: 8 U (ref 0–19)

## 2023-06-05 MED ORDER — CHOLESTYRAMINE-ASPARTAME 4 GRAM PO PWPK
4 g | Freq: Every day | ORAL | 0 refills | Status: DC
Start: 2023-06-05 — End: 2023-06-06
  Administered 2023-06-06: 04:00:00 4 g via ORAL

## 2023-06-05 MED ORDER — INSULIN ASPART 100 UNIT/ML SC FLEXPEN
3 [IU] | Freq: Three times a day (TID) | SUBCUTANEOUS | 0 refills | Status: DC
Start: 2023-06-05 — End: 2023-06-07

## 2023-06-05 MED ORDER — INSULIN ASPART 100 UNIT/ML SC FLEXPEN
4 [IU] | Freq: Three times a day (TID) | SUBCUTANEOUS | 0 refills | Status: DC
Start: 2023-06-05 — End: 2023-06-05

## 2023-06-05 MED ORDER — HYDROCORTISONE 1 % TP CREA
Freq: Two times a day (BID) | TOPICAL | 0 refills | Status: DC
Start: 2023-06-05 — End: 2023-06-07
  Administered 2023-06-05: 20:00:00 via TOPICAL

## 2023-06-05 MED ORDER — INSULIN GLARGINE 100 UNIT/ML (3 ML) SC INJ PEN
16 [IU] | Freq: Two times a day (BID) | SUBCUTANEOUS | 0 refills | Status: DC
Start: 2023-06-05 — End: 2023-06-09

## 2023-06-05 MED ORDER — PEG-ELECTROLYTE SOLN 420 GRAM PO SOLR
4 L | Freq: Once | ORAL | 0 refills | Status: CP
Start: 2023-06-05 — End: ?
  Administered 2023-06-05: 20:00:00 4 L via ORAL

## 2023-06-05 MED ORDER — CETIRIZINE 10 MG PO TAB
10 mg | Freq: Every day | ORAL | 0 refills | Status: DC
Start: 2023-06-05 — End: 2023-06-07
  Administered 2023-06-05 – 2023-06-07 (×3): 10 mg via ORAL

## 2023-06-05 MED ORDER — BISACODYL 10 MG RE SUPP
10 mg | Freq: Once | RECTAL | 0 refills | Status: AC
Start: 2023-06-05 — End: ?

## 2023-06-05 MED ORDER — ALPRAZOLAM 0.5 MG PO TAB
1 mg | Freq: Three times a day (TID) | ORAL | 0 refills | Status: DC | PRN
Start: 2023-06-05 — End: 2023-06-12
  Administered 2023-06-05 – 2023-06-12 (×11): 1 mg via ORAL

## 2023-06-05 NOTE — Progress Notes
[facility] END OF SHIFT/ JHFRAT NOTE    Admission Date: 06/02/2023  Length of Stay: LOS: 3 days    Acute events, interventions, provider communication: Pt complaining of constipation, laxative given.     Patient Interventions and Education  Fall Risk/JHFRAT Interventions and Education: (Charting when applicable)   Total Fall Risk Score: Total Fall Risk Score: 5.   Elimination Interventions : N/A  Medications : N/A  Patient Care Equipment: N/A  Mobility: N/A  Cognition: N/A  Risk for Moderate/Major Injury: N/A  2. BMAT: Bedside Mobility Assessment Tool (BMAT)  Sit and Shake: Pass  Extend and Point: Pass  Stand: Pass  Step: Pass (Green: walk without assistive device)    Restraints:  No       See Docflowsheet for restraint documentation, interventions, education, etc.    Activity:  Mobility: good    If other, explain:     Hygiene:     Bath/Shower: Soap and water shower/bath     Oral Care: Brush, Moisturize     Urinary Catheter / Perineal Care: Self    Intake and Output:           Last Bowel Movement Date:  (PTA),           Date 06/04/23 0701 - 06/05/23 0700 06/05/23 0701 - 06/06/23 0700   Shift 0701-1900 1901-0700 24 Hour Total 0701-1900 1901-0700 24 Hour Total   INTAKE   P.O.  222 222      Shift Total(mL/kg)  222(2.4) 222(2.4)      OUTPUT   Other           Stool Occurrence 0 x 0 x 0 x        Urine Occurrence  4 x 4 x 8 x      Shift Total(mL/kg)         NET  222 222      Weight (kg) 93 93 93 93 93 93

## 2023-06-05 NOTE — Progress Notes
General Progress Note    Admission Date: 06/02/2023       LOS: 3 days    Date of Service: 06/05/2023    Assessment/Plan:      [name] is a 42 y.o. male. With past medical history of crohn's disease, diabetes, GAD, fibromyalgia presented to [name] as a transfer from outside hospital for elevated LFTs and itching.      Acute Liver Injury, improving  Pruritus  -AST of 132, ALT 320, ALP 325, TB of 5.1 at OSH  -Hepatitis panel negative  -CT Abdomen at OSH shows mild esophagitis, small pericardial effusion, mild atrophic pancreas with faint calcifications due to chronic pancreatitis   -RUQ U/S: Prior cholecystectomy with dilated CBD.  Mild diffuse parenchymal hepatic disease, mild splenomegaly  -Ddx: DILI d/t Augmentin vs biliary pathology (I.e. PSC) 2/2 Crohn's Disease.  -EBV panel w/ positive IgG and negative IgM. CMV negative. HSV not detected. Iron panel WNL. Acute hepatitis panel negative.     Plan  -Consulted Hepatology, they have now signed off.   -ERCP without significant abnormalities  -Obtain further lab work:  alpha-1 antitrypsin, ANA, AMA, ASMA.  -Obtain fecal calprotectin if able (patient lives further away and outpatient GI will have available for upcoming appointment). Discussed w/ patient.   -LFTs with slight bump, to be expected with ERCP, will continue to monitor daily. Hepatology believes bilirubin is lagging behind AST, ALT.   -Will f/u path from FNA.      Trivial Pericardial Effusion  -Noted incidentally on CT A/P at OSH  -No chest pain, has diffuse pain due to fibromyalgia history  -TTE w/ trivial pericardial effusion     Chronic Pancreatitis  -Continue home pancreatic enzymes supplements     Upper Respiratory Tract Symptoms for the past one week/Sinusitis  -S/p 5 day course of Augmentin, CXR negative.      Crohn's disease history  -Follows up with GI. Denies current black/bloody bowel movements  Plan  -Currently on Stelera every 4 weeks  -Outpatient GI follow up     T2DM  -Last A1c 9.1 (06/05/2021); repeat 9.8% 06/03/23  -PTA on Tresiba 30 units BID, 4 units TIDWM  Plan  -Down-titrated insulin to glargine 16 units BID, aspart LDCF , added back home meal-time insulin at reduced dose.   -Re-uptitrate insulin back to PTA doses as able    Hypothyroidism  -Continue home levothyroxine     Anxiety Disorder  -Continue home Xanax PRN  -Continue nor tryptialine    Constipation  -Pt requesting Nulytely to assist with constipation, rather than suppository or enema.     FEN: diabetic diet  DVT ppx: SCDs, restart lovenox  Code Status: FULL CODE    I spent >35 minutes total time in patient care today, including reviewing the patient's chart, interviewing and examining the patient, counseling the patient on the plan, coordinating the patient's care, and documenting the patient encounter.    [name], MD      ________________________________________________________________________    Subjective  Overnight, there were no acute events.  His wife was present at bedside today. We reviewed results of his labs, and Hepatology recs from yesterday. Pt denies any new symptoms, and says his chronic pain is stable. He denies dyspnea. He says he has pruritus, likely from the bilirubin. We discussed some strategies to treat this.     He would like to try Nulytely for assisting in having a bowel movement today, so will order this for him to sip on.  We discussed plan to continue to closely monitor labs for improvement, and all questions/concerns were addressed.     Medications  Scheduled Meds:enoxaparin (LOVENOX) syringe 40 mg, 40 mg, Subcutaneous, QDAY(21)  gabapentin (NEURONTIN) capsule 300 mg, 300 mg, Oral, TID  insulin aspart (U-100) (NOVOLOG FLEXPEN U-100 INSULIN) injection PEN 0-6 Units, 0-6 Units, Subcutaneous, ACHS (22)  insulin glargine (LANTUS SOLOSTAR U-100 INSULIN) injection PEN 15 Units, 15 Units, Subcutaneous, BID  levothyroxine (SYNTHROID) tablet 25 mcg, 25 mcg, Oral, QDAY 30 min before breakfast  lipase-protease-amylase (ZENPEP 40) capsule 1 capsule, 1 capsule, Oral, w/Snacks  lipase-protease-amylase (ZENPEP 40) capsule 2 capsule, 2 capsule, Oral, TID w/ meals  nortriptyline (PAMELOR) capsule 50 mg, 50 mg, Oral, QHS    Continuous Infusions:  PRN and Respiratory Meds:ALPRAZolam TID PRN, dextrose 50% (D50) IV PRN, fluticasone propionate BID PRN, hydrOXYzine HCL TID PRN, melatonin QHS PRN, morphine Q6H PRN, ondansetron Q6H PRN **OR** ondansetron (ZOFRAN) IV Q6H PRN, polyethylene glycol 3350 QDAY PRN, sennosides-docusate sodium QDAY PRN      Objective:                          Vital Signs: Last Filed                 Vital Signs: 24 Hour Range   BP: 117/77 (12/06 1937)  Temp: 36.9 ?C (98.5 ?F) (12/06 1937)  Pulse: 95 (12/06 1937)  Respirations: 16 PER MINUTE (12/06 1937)  SpO2: 95 % (12/06 1937)  O2 Device: None (Room air) (12/06 1937) BP: (97-118)/(62-78)   Temp:  [36.6 ?C (97.9 ?F)-37.1 ?C (98.8 ?F)]   Pulse:  [89-95]   Respirations:  [16 PER MINUTE-18 PER MINUTE]   SpO2:  [95 %-98 %]   O2 Device: None (Room air)   Intensity Pain Scale (Self Report): Asleep (06/05/23 0406) Vitals:    06/02/23 0100 06/02/23 1113   Weight: 93 kg (205 lb 0.4 oz) 93 kg (205 lb 0.4 oz)           Physical Exam    General: awake & oriented, no acute distress  Head: normocephalic, atraumatic  CV: regular rhythm, no murmur/rub/gallop  Lungs: clear to ausculation bilaterally, no wheezes/rhonchi/crackles  Abdomen: soft, non-tender, non-distended, normo-active bowel sounds  Extremities: no clubbing/cyanosis/edema  Neuro: no focal deficits    Lab Review  Pertinent labs reviewed    Point of Care Testing  (Last 24 hours)  POC Glucose (Download): (!) 247 (06/04/23 2111)    Radiology and other Diagnostics Review:    Pertinent radiology reviewed.

## 2023-06-06 ENCOUNTER — Inpatient Hospital Stay: Admit: 2023-06-06 | Discharge: 2023-06-06 | Payer: Private Health Insurance - Indemnity

## 2023-06-06 LAB — POC GLUCOSE
~~LOC~~ BKR POC GLUCOSE: 254 mg/dL — ABNORMAL HIGH (ref 70–100)
~~LOC~~ BKR POC GLUCOSE: 168 mg/dL — ABNORMAL HIGH (ref 70–100)
~~LOC~~ BKR POC GLUCOSE: 171 mg/dL — ABNORMAL HIGH (ref 70–100)
~~LOC~~ BKR POC GLUCOSE: 184 mg/dL — ABNORMAL HIGH (ref 70–100)

## 2023-06-06 LAB — LIPASE: ~~LOC~~ BKR LIPASE: <3 U/L — ABNORMAL LOW (ref 11–82)

## 2023-06-06 MED ORDER — CHOLESTYRAMINE-ASPARTAME 4 GRAM PO PWPK
4 g | Freq: Two times a day (BID) | ORAL | 0 refills | Status: DC
Start: 2023-06-06 — End: 2023-06-09
  Administered 2023-06-07 – 2023-06-09 (×6): 4 g via ORAL

## 2023-06-06 NOTE — Progress Notes
General Progress Note    Admission Date: 06/02/2023       LOS: 4 days    Date of Service: 06/06/2023    Assessment/Plan:      [name] is a 42 y.o. male. With past medical history of crohn's disease, diabetes, GAD, fibromyalgia presented to [facility] as a transfer from outside hospital for elevated LFTs and itching.      Elevated AST, ALT- improving  Elevated Bilirubin, Alk Phos- increasing  Pruritus  -AST of 132, ALT 320, ALP 325, TB of 5.1 at OSH  -Hepatitis panel negative  -CT Abdomen at OSH shows mild esophagitis, small pericardial effusion, mild atrophic pancreas with faint calcifications due to chronic pancreatitis   -RUQ U/S: Prior cholecystectomy with dilated CBD.  Mild diffuse parenchymal hepatic disease, mild splenomegaly  -Ddx: DILI d/t Augmentin vs biliary pathology (I.e. PSC) 2/2 Crohn's Disease.  -EBV panel w/ positive IgG and negative IgM. CMV negative. HSV not detected. Iron panel WNL. Acute hepatitis panel negative. A1AT WNL. ANA WNL. ASMA was WNL.   -AMA was positive.   -ERCP without significant abnormalities    Plan  -Consulted Hepatology, they have now signed off. I have paged Dr Shea Evans regarding up-trending bilirubin 12/8 AM.   -Will obtain RUQ Ultrasound to further assess.   -Obtain fecal calprotectin if able (patient lives further away and outpatient GI will have available for upcoming appointment). Discussed w/ patient.   -Will f/u path from FNA.   -Lipase ordered due to abdominal discomfort 12/8, resulted as <3.     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 He follows with Hematology as an outpatient to address this.      Trivial Pericardial Effusion  -Noted incidentally on CT A/P at OSH  -No chest pain, has diffuse pain due to fibromyalgia history  -TTE w/ trivial pericardial effusion     Chronic Pancreatitis  -Continue home pancreatic enzymes supplements.  -Lipase 12/8 <3.     Upper Respiratory Tract Symptoms for the past one week/Sinusitis  -S/p 5 day course of Augmentin, CXR negative.      Crohn's disease history  -Follows up with GI. Denies current black/bloody bowel movements  Plan  -Currently on Stelera every 4 weeks  -Outpatient GI follow up     T2DM  -Last A1c 9.1 (06/05/2021); repeat 9.8% 06/03/23  -PTA on Tresiba 30 units BID, 4 units TIDWM  Plan  -Down-titrated insulin to glargine 16 units BID, aspart LDCF , added back home meal-time insulin at reduced dose.   -Re-uptitrate insulin back to PTA doses as able    Hypothyroidism  -Continue home levothyroxine     Anxiety Disorder  -Continue home Xanax PRN  -Continue nor tryptialine    Constipation-improved  -Last BM 12/8 AM    FEN: diabetic diet  DVT ppx: SCDs, restart lovenox  Code Status: FULL CODE    I spent >50 minutes total time in patient care today, including reviewing the patient's chart, interviewing and examining the patient, counseling the patient on the plan, coordinating the patient's care, and documenting the patient encounter.    I reached out to GI team, and paged Hepatology team regarding this case.     [name], MD      ________________________________________________________________________    Subjective  Overnight, there were no acute events.  Patient denies any acute concerns at this time.  He reports he continues to have pruritus.  He says so far the  treatment for this has not helped.  We reviewed the results of his labs, and discussed that his AST and ALTs are improving, though his bilirubin is increased again today.  Discussed that I have paged the hepatology team to discuss this, and while awaiting a response will check an ultrasound to ensure that he does not have any stones, sludge, or other abnormalities since his ERCP a few days prior.  We discussed the results of his leukocytosis and eosinophilia.  Patient confirms he follows with the hematology team regarding this.  Discussed workup is ongoing as an outpatient, and he will see him in clinic again soon.    Discussed that from my standpoint I would like to have his bilirubin improvement prior to discharge.  Will order ultrasound, lipase, and direct bilirubin to further evaluate, while awaiting hepatology response.    Medications  Scheduled Meds:bisacodyL (DULCOLAX) rectal suppository 10 mg, 10 mg, Rectal, ONCE  cetirizine (ZyrTEC) tablet 10 mg, 10 mg, Oral, QDAY  cholestyramine-aspartame (QUESTRAN LIGHT) 4 gram packet 4 g, 4 g, Oral, QDAY  enoxaparin (LOVENOX) syringe 40 mg, 40 mg, Subcutaneous, QDAY(21)  gabapentin (NEURONTIN) capsule 300 mg, 300 mg, Oral, TID  hydrocortisone (CORTIZONE-10) 1 % topical cream, , Topical, BID  insulin aspart (U-100) (NOVOLOG FLEXPEN U-100 INSULIN) injection PEN 0-6 Units, 0-6 Units, Subcutaneous, ACHS (22)  insulin aspart (U-100) (NOVOLOG FLEXPEN U-100 INSULIN) injection PEN 3 Units, 3 Units, Subcutaneous, TID w/ meals  insulin glargine (LANTUS SOLOSTAR U-100 INSULIN) injection PEN 16 Units, 16 Units, Subcutaneous, BID  levothyroxine (SYNTHROID) tablet 25 mcg, 25 mcg, Oral, QDAY 30 min before breakfast  lipase-protease-amylase (ZENPEP 40) capsule 1 capsule, 1 capsule, Oral, w/Snacks  lipase-protease-amylase (ZENPEP 40) capsule 2 capsule, 2 capsule, Oral, TID w/ meals  nortriptyline (PAMELOR) capsule 50 mg, 50 mg, Oral, QHS    Continuous Infusions:  PRN and Respiratory Meds:ALPRAZolam TID PRN, dextrose 50% (D50) IV PRN, fluticasone propionate BID PRN, hydrOXYzine HCL TID PRN, melatonin QHS PRN, morphine Q6H PRN, ondansetron Q6H PRN **OR** ondansetron (ZOFRAN) IV Q6H PRN, polyethylene glycol 3350 QDAY PRN, sennosides-docusate sodium QDAY PRN      Objective:                          Vital Signs: Last Filed                 Vital Signs: 24 Hour Range   BP: 101/65 (12/08 0557)  Temp: 36.6 ?C (97.9 ?F) (12/08 4540)  Pulse: 85 (12/08 0557)  Respirations: 18 PER MINUTE (12/08 0557)  SpO2: 96 % (12/08 0557)  O2 Device: None (Room air) (12/08 0557) BP: (90-132)/(48-83)   Temp: [36.6 ?C (97.9 ?F)-37 ?C (98.6 ?F)]   Pulse:  [78-89]   Respirations:  [16 PER MINUTE-18 PER MINUTE]   SpO2:  [93 %-99 %]   O2 Device: None (Room air)   Intensity Pain Scale (Self Report): 5 (06/05/23 0825) Vitals:    06/02/23 0100 06/02/23 1113   Weight: 93 kg (205 lb 0.4 oz) 93 kg (205 lb 0.4 oz)           Physical Exam    General: awake & oriented, no acute distress  Head: normocephalic, atraumatic  CV: regular rhythm, no murmur/rub/gallop  Lungs: clear to ausculation bilaterally, no wheezes/rhonchi/crackles  Abdomen: soft, non-tender, non-distended, normo-active bowel sounds  Extremities: no clubbing/cyanosis/edema  Neuro: no focal deficits    Lab Review  Pertinent labs reviewed    Point of Care  Testing  (Last 24 hours)  Glucose: (!) 166 (06/05/23 0626)  POC Glucose (Download): (!) 254 (06/05/23 2127)    Radiology and other Diagnostics Review:    Pertinent radiology reviewed.

## 2023-06-06 NOTE — Progress Notes
[facility] END OF SHIFT/ JHFRAT NOTE    Admission Date: 06/02/2023  Length of Stay: LOS: 4 days    Acute events, interventions, provider communication: no acute interventions this shift.     Patient Interventions and Education  Fall Risk/JHFRAT Interventions and Education: (Charting when applicable)   Total Fall Risk Score: Total Fall Risk Score: 5.   Elimination Interventions : N/A  Medications : Educate patient on medication side effects  Patient Care Equipment: N/A  Mobility: N/A  Cognition: N/A  Risk for Moderate/Major Injury: N/A  2. BMAT: Bedside Mobility Assessment Tool (BMAT)  Sit and Shake: Pass  Extend and Point: Pass  Stand: Pass  Step: Pass (Green: walk without assistive device)    Restraints:  No       See Docflowsheet for restraint documentation, interventions, education, etc.    Activity:  Mobility: good    If other, explain:     Hygiene:     Bath/Shower: Soap and water shower/bath     Oral Care: Brush, Moisturize     Urinary Catheter / Perineal Care: Self    Intake and Output:           Last Bowel Movement Date:  (PTA),           Date 06/05/23 0701 - 06/06/23 0700 06/06/23 0701 - 06/07/23 0700   Shift 0701-1900 1901-0700 24 Hour Total 0701-1900 1901-0700 24 Hour Total   INTAKE   P.O. 0 1075 1075      Shift Total(mL/kg) 0(0) 1075(11.6) 1075(11.6)      OUTPUT   Other           Stool Occurrence 0 x 0 x 0 x        Urine Occurrence  1 x 3 x 4 x      Shift Total(mL/kg)         NET 0 1075 1075      Weight (kg) 93 93 93 93 93 93

## 2023-06-06 NOTE — Progress Notes
[facility] END OF SHIFT/ JHFRAT NOTE    Admission Date: 06/02/2023  Length of Stay: LOS: 3 days    Acute events, interventions, provider communication: NA    Patient Interventions and Education  Fall Risk/JHFRAT Interventions and Education: (Charting when applicable)   Total Fall Risk Score: Total Fall Risk Score: 5.   Elimination Interventions : N/A  Medications : N/A  Patient Care Equipment: N/A  Mobility: N/A  Cognition: N/A  Risk for Moderate/Major Injury: N/A  2. BMAT: Bedside Mobility Assessment Tool (BMAT)  Sit and Shake: Pass  Extend and Point: Pass  Stand: Pass  Step: Pass (Green: walk without assistive device)    Restraints:  No       See Docflowsheet for restraint documentation, interventions, education, etc.    Activity:  Mobility: good    If other, explain:     Hygiene:     Bath/Shower: Soap and water shower/bath     Oral Care: Brush, Moisturize     Urinary Catheter / Perineal Care: Self    Intake and Output:           Last Bowel Movement Date:  (PTA),           Date 06/04/23 0701 - 06/05/23 0700 06/05/23 0701 - 06/06/23 0700   Shift 0701-1900 1901-0700 24 Hour Total 0701-1900 1901-0700 24 Hour Total   INTAKE   P.O.  222 222 0  0   Shift Total(mL/kg)  222(2.4) 222(2.4) 0(0)  0(0)   OUTPUT   Other           Stool Occurrence 0 x 0 x 0 x 0 x  0 x     Urine Occurrence  4 x 4 x 8 x 1 x  1 x   Shift Total(mL/kg)         NET  222 222 0  0   Weight (kg) 93 93 93 93 93 93

## 2023-06-06 NOTE — Care Plan
Problem: Harm to self, low risk of suicide  Goal: Absence of harm to self, low risk of suicide  Outcome: Goal Ongoing     Problem: Tobacco Use  Goal: Knowledge of tobacco-use cessation methods  Outcome: Goal Ongoing     Problem: Discharge Planning  Goal: Participation in plan of care  Outcome: Goal Ongoing  Goal: Knowledge regarding plan of care  Outcome: Goal Ongoing  Goal: Prepared for discharge  Outcome: Goal Ongoing

## 2023-06-07 LAB — MANUAL DIFF
~~LOC~~ BKR BASOPHILS - RELATIVE: 2 % (ref 0–2)
~~LOC~~ BKR EOSINOPHILS - RELATIVE: 6 % — ABNORMAL HIGH (ref 0–5)
~~LOC~~ BKR LYMPHOCYTES - RELATIVE: 35 % (ref 24–44)
~~LOC~~ BKR MONOCYTES - RELATIVE: 9 % (ref 4–12)
~~LOC~~ BKR NEUT+BANDS - ABSOLUTE: 7.1 10*3/uL — ABNORMAL HIGH (ref 1.8–7.0)
~~LOC~~ BKR NEUTROPHILS - RELATIVE: 48 % (ref 41–77)

## 2023-06-07 LAB — IMMUNOGLOBULINS-IGA,IGG,IGM
~~LOC~~ BKR IGA: 375 mg/dL (ref 70–390)
~~LOC~~ BKR IGG: 986 mg/dL (ref 762–1488)
~~LOC~~ BKR IGM: 76 mg/dL (ref 38–328)

## 2023-06-07 LAB — POC GLUCOSE
~~LOC~~ BKR POC GLUCOSE: 204 mg/dL — ABNORMAL HIGH (ref 70–100)
~~LOC~~ BKR POC GLUCOSE: 149 mg/dL — ABNORMAL HIGH (ref 70–100)
~~LOC~~ BKR POC GLUCOSE: 152 mg/dL — ABNORMAL HIGH (ref 70–100)
~~LOC~~ BKR POC GLUCOSE: 164 mg/dL — ABNORMAL HIGH (ref 70–100)

## 2023-06-07 MED ORDER — INSULIN ASPART 100 UNIT/ML SC FLEXPEN
5 [IU] | Freq: Three times a day (TID) | SUBCUTANEOUS | 0 refills | Status: DC
Start: 2023-06-07 — End: 2023-06-09

## 2023-06-07 MED ORDER — URSODIOL 250 MG PO TAB
500 mg | Freq: Two times a day (BID) | ORAL | 0 refills | Status: DC
Start: 2023-06-07 — End: 2023-06-17
  Administered 2023-06-08 – 2023-06-17 (×20): 500 mg via ORAL

## 2023-06-07 MED ORDER — TRIAMCINOLONE ACETONIDE 0.1 % TP OINT
Freq: Two times a day (BID) | TOPICAL | 0 refills | Status: DC
Start: 2023-06-07 — End: 2023-06-17
  Administered 2023-06-08: 14:00:00 via TOPICAL

## 2023-06-07 NOTE — Progress Notes
General Progress Note    Admission Date: 06/02/2023       LOS: 5 days    Date of Service: 06/07/2023    Assessment/Plan:      [name] is a 42 y.o. male. With past medical history of crohn's disease, diabetes, GAD, fibromyalgia presented to [facility] as a transfer from outside hospital for elevated LFTs and itching.      Elevated AST, ALT- improving  Elevated Bilirubin, Alk Phos- increasing  Pruritus  -AST of 132, ALT 320, ALP 325, TB of 5.1 at OSH  -Hepatitis panel negative  -CT Abdomen at OSH shows mild esophagitis, small pericardial effusion, mild atrophic pancreas with faint calcifications due to chronic pancreatitis   -RUQ U/S: Prior cholecystectomy with dilated CBD.  Mild diffuse parenchymal hepatic disease, mild splenomegaly  -Ddx: DILI d/t Augmentin vs biliary pathology (I.e. PSC) 2/2 Crohn's Disease.  -EBV panel w/ positive IgG and negative IgM. CMV negative. HSV not detected. Iron panel WNL. Acute hepatitis panel negative. A1AT WNL. ANA WNL. ASMA was WNL.   -AMA was positive. Total immunoglobulins WNL.   -ERCP without significant abnormalities.  --RUQ [place] w/ prior cholecystectomy. Bile duct dilation has resolved. Mild hepatosplenomegaly. Direct bilirubin significantly elevated. Lipase <3.     Plan  -Consulted Hepatology -follow-up pathology, fecal calprotectin.   -Continue Cholestyramine 4g BID.   -Start Ursodeoxycholic acid 600mg  BID (initiated 500mg  as 600mg  was not an option).  -Consulted Dermatology with pruritus, leukocytosis and eosinophilia. They obtained biopsy. Suture removal due 06/20/23.   -TSH, HIV for additional pruritus work-up.   -Initiated Tramcinolone BID, defer anti-histamines at this time.     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 He follows with Hematology as an outpatient to address this.      Trivial Pericardial Effusion  -Noted incidentally on CT A/P at OSH  -No chest pain, has diffuse pain due to fibromyalgia history  -TTE w/ trivial pericardial effusion     Chronic Pancreatitis  -Continue home pancreatic enzymes supplements.  -Lipase 12/8 <3.     Upper Respiratory Tract Symptoms for the past one week/Sinusitis  -S/p 5 day course of Augmentin, CXR negative.      Crohn's disease history  -Follows up with GI. Denies current black/bloody bowel movements  Plan  -Currently on Stelera every 4 weeks  -Outpatient GI follow up     T2DM  -Last A1c 9.1 (06/05/2021); repeat 9.8% 06/03/23  -PTA on Tresiba 30 units BID, 4 units TIDWM  Plan  -Down-titrated insulin to glargine 16 units BID, aspart LDCF , added back home meal-time insulin at reduced dose.   -Re-uptitrate insulin back to PTA doses as able    Hypothyroidism  -Continue home levothyroxine     Anxiety Disorder  -Continue home Xanax PRN  -Continue nor tryptialine    Constipation-improved  -Last BM 12/8 AM    FEN: diabetic diet  DVT ppx: SCDs, restart lovenox  Code Status: FULL CODE    I spent >35 minutes total time in patient care today, including reviewing the patient's chart, interviewing and examining the patient, counseling the patient on the plan, coordinating the patient's care, and documenting the patient encounter.      [name], MD      ________________________________________________________________________    Subjective  Overnight, there were no acute events.  Patient denies any acute concerns at this time.  He reports he continues to have pruritus.  He says treatment has not  been helpful so far.  Discussed I will consult dermatology due to eosinophilia, leukocytosis, and pruritus with rash.  Discussed hepatology team will plan to see him today in regards to his hyperbilirubinemia.  Discussed his bilirubin levels have stabilized.    Will continue to closely monitor lab work while awaiting downtrending of his bilirubin levels.    All questions and concerns were addressed.    Medications  Scheduled Meds:cetirizine (ZyrTEC) tablet 10 mg, 10 mg, Oral, QDAY  cholestyramine-aspartame (QUESTRAN LIGHT) 4 gram packet 4 g, 4 g, Oral, BID(10-22)  enoxaparin (LOVENOX) syringe 40 mg, 40 mg, Subcutaneous, QDAY(21)  gabapentin (NEURONTIN) capsule 300 mg, 300 mg, Oral, TID  hydrocortisone (CORTIZONE-10) 1 % topical cream, , Topical, BID  insulin aspart (U-100) (NOVOLOG FLEXPEN U-100 INSULIN) injection PEN 0-6 Units, 0-6 Units, Subcutaneous, ACHS (22)  insulin aspart (U-100) (NOVOLOG FLEXPEN U-100 INSULIN) injection PEN 3 Units, 3 Units, Subcutaneous, TID w/ meals  insulin glargine (LANTUS SOLOSTAR U-100 INSULIN) injection PEN 16 Units, 16 Units, Subcutaneous, BID  levothyroxine (SYNTHROID) tablet 25 mcg, 25 mcg, Oral, QDAY 30 min before breakfast  lipase-protease-amylase (ZENPEP 40) capsule 1 capsule, 1 capsule, Oral, w/Snacks  lipase-protease-amylase (ZENPEP 40) capsule 2 capsule, 2 capsule, Oral, TID w/ meals  nortriptyline (PAMELOR) capsule 50 mg, 50 mg, Oral, QHS    Continuous Infusions:  PRN and Respiratory Meds:ALPRAZolam TID PRN, dextrose 50% (D50) IV PRN, fluticasone propionate BID PRN, hydrOXYzine HCL TID PRN, melatonin QHS PRN, morphine Q6H PRN, ondansetron Q6H PRN **OR** ondansetron (ZOFRAN) IV Q6H PRN, polyethylene glycol 3350 QDAY PRN, sennosides-docusate sodium QDAY PRN      Objective:                          Vital Signs: Last Filed                 Vital Signs: 24 Hour Range   BP: 95/58 (12/09 0346)  Temp: 36.6 ?C (97.9 ?F) (12/09 1610)  Pulse: 79 (12/09 0346)  Respirations: 16 PER MINUTE (12/09 0346)  SpO2: 96 % (12/09 0346)  O2 Device: None (Room air) (12/09 0346) BP: (87-121)/(55-86)   Temp:  [36.5 ?C (97.7 ?F)-37.2 ?C (99 ?F)]   Pulse:  [79-85]   Respirations:  [16 PER MINUTE-18 PER MINUTE]   SpO2:  [94 %-99 %]   O2 Device: None (Room air)   Intensity Pain Scale (Self Report): Asleep (06/07/23 0410) Vitals:    06/02/23 0100 06/02/23 1113   Weight: 93 kg (205 lb 0.4 oz) 93 kg (205 lb 0.4 oz)           Physical Exam    General: awake & oriented, no acute distress  Head: normocephalic, atraumatic  CV: regular rhythm, no murmur/rub/gallop  Lungs: clear to ausculation bilaterally, no wheezes/rhonchi/crackles  Abdomen: soft, non-tender, non-distended, normo-active bowel sounds  Extremities: no clubbing/cyanosis/edema  Neuro: no focal deficits  Derm: multiple excoriation lesions noted    Lab Review  Pertinent labs reviewed    Point of Care Testing  (Last 24 hours)  POC Glucose (Download): (!) 204 (06/06/23 2106)    Radiology and other Diagnostics Review:    Pertinent radiology reviewed.

## 2023-06-07 NOTE — Progress Notes
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   on Stelara every 4 weeks   BMI 30  T2DM, HLD      R factor of 1.7 indicating cholestatic injury.  Differential most likely drug-induced liver injury especially from Augmentin.  Other differential in the setting of IBD includes PSC.  Other differentials less likely: Infectious hepatitis (including CMV, EBV, HSV, VZV) especially in the setting of mild immunosuppression with Stelara, heart failure and congestive hepatopathy, autoimmune hepatitis  May have underlying metabolic associated fatty liver disease.  Has a history of pancreatitis 3 to 4 years ago.  Cholecystectomy 3 to 4 years ago.  On abd u/s, The common duct measures 10 mm at the porta hepatis     Interval updates:  Underwent EUS and ERCP 12/4 that was mostly unremarkable except for lymph nodes that were enlarged and biopsy of the porta hepatis and prepancreatic lymph nodes.  CBD of 3.5 mm.    ANA was negative, antimitochondrial antibody was positive.  Total immunoglobulins normal.  Acute hepatitis panel negative.  CMV IgG/IgM/PCR negative.  Iron profile unremarkable.  HSV PCR negative.  EBV panel remarkable for previous infection  Has significant fatigue and pruritus    Recommendations:  --Hold further Augmentin  --Continue to trend CMP daily  -- Continue bile acid binder (on cholestyramine 4 g twice daily, started evening 12/8)  --Start Urso deoxy cholic acid 600 mg twice daily  -- Follow-up lymph node biopsies  -- Follow-up in hepatology clinic in 4 to 8 weeks after discharge  -- Have follow-up labs (CMP), in 1 week from discharge    Patient should continue his outpatient follow-up with GI for his Crohn's disease in February 2024.  Has fecal calprotectin pending    Patient seen/discussed with Dr. [place]  For any questions/concerns, please reach out to Hepatology fellow on-call.    --------------------------------------------------------------------------------------------------------------------------------------  Interval history/subjective:  -Eating okay, still having significant pruritus  -Had right arm biopsy with dermatology for rash and peripheral eosinophilia    Physical Exam:  Vitals:    06/06/23 2008 06/07/23 0346 06/07/23 0757 06/07/23 1212   BP: 118/77 95/58 90/58  109/73   BP Source: Arm, Left Upper Arm, Left Upper Arm, Left Upper Arm, Right Lower   Pulse: 84 79 80 86   Temp: 37.2 ?C (99 ?F) 36.6 ?C (97.9 ?F) 36.6 ?C (97.8 ?F) 36.7 ?C (98 ?F)   SpO2: 94% 96% 95% 96%   O2 Device: None (Room air) None (Room air) None (Room air) None (Room air)   Weight:       Height:           General: in no acute distress.  HEENT: icteric sclera.  Neck: Supple, trachea midline.   Heart: Appears well perfused  Lungs: Breathing is non-labored on room air.  Abdomen: Soft, non-distended. Non-tender to palpation.  Extremities:  Minimal edema.   Skin: jaundiced  Neuro: No focal neurologic deficit  Psych: Appropriate  affect.    Laboratory:  Lab Results   Component Value Date    WBC 14.70 (H) 06/07/2023    HGB 14.9 06/07/2023    HCT 43.1 06/07/2023    MCV 87.9 06/07/2023     No components found for: SEDRATE  Lab Results   Component Value Date    CRP 1.00 (H) 06/02/2023     No components found for: RETICCTPCT  Lab Results   Component Value Date    IRON 118 06/02/2023    TIBC 325 06/02/2023    FERRITIN 53 06/02/2023   No components found for: VITAMINB12No components found for: FOLATENo results found for: CELIACIGA, Alonza Smoker, TTIGG  Lab Results   Component Value Date    HPYLORIAG NEGATIVE 09/11/2021     Lab Results   Component Value Date    NA 132 (L) 06/07/2023    K 4.3 06/07/2023    CO2 28 06/07/2023    CL 98 06/07/2023    BUN 11 06/07/2023     No results found for: AMYLASE  Lab Results   Component Value Date    LIPASE <3 (L) 06/06/2023     Lab Results   Component Value Date    TOTBILI 10.4 (H) 06/07/2023    DBILI 6.6 (H) 06/06/2023    ALBUMIN 3.4 (L) 06/07/2023    AST 73 (H) 06/07/2023    ALT 114 (H) 06/07/2023     Lab Results   Component Value Date    INR 0.9 12/17/2020     Lab Results   Component Value Date    HEPAIGM Non-reactive 06/02/2023    HEPBCIGM Non-reactive 06/02/2023    HEPBSAG Non-reactive 06/02/2023     No results found for: AMASC, AMATR, ANASCR, ANATITER, ASMSC, ASMTR, LIVERKIDNEY, SLAAUTOAB, TOTIGG, SLAAGAB

## 2023-06-07 NOTE — Progress Notes
[facility] END OF SHIFT/ JHFRAT NOTE    Admission Date: 06/02/2023  Length of Stay: LOS: 5 days    Acute events, interventions, provider communication: no acute events throughout shift. VSS. No questions or concerns at this time. Care Ongoing     Patient Interventions and Education  Fall Risk/JHFRAT Interventions and Education: (Charting when applicable)   Total Fall Risk Score: Total Fall Risk Score: 5.   Elimination Interventions : N/A  Medications : Educate patient on medication side effects  Patient Care Equipment: N/A  Mobility: N/A  Cognition: N/A  Risk for Moderate/Major Injury: N/A  2. BMAT: Bedside Mobility Assessment Tool (BMAT)  Sit and Shake: Pass  Extend and Point: Pass  Stand: Pass  Step: Pass (Green: walk without assistive device)    Restraints:  No       See Docflowsheet for restraint documentation, interventions, education, etc.    Activity:  Mobility: good    If other, explain:     Hygiene:     Bath/Shower: Soap and water shower/bath     Oral Care: Brush, Moisturize     Urinary Catheter / Perineal Care: Self    Intake and Output:           Last Bowel Movement Date: 06/06/23,           Date 06/06/23 0701 - 06/07/23 0700 06/07/23 0701 - 06/08/23 0700   Shift 0701-1900 1901-0700 24 Hour Total 0701-1900 1901-0700 24 Hour Total   INTAKE   P.O. [phone]      Shift Total(mL/kg) 960(10.3) 475(5.1) 1435(15.4)      OUTPUT   Other           Stool Occurrence 2 x 0 x 2 x        Urine Occurrence  2 x 1 x 3 x      Shift Total(mL/kg)         NET [phone]      Weight (kg) 93 93 93 93 93 93

## 2023-06-07 NOTE — Progress Notes
[facility] END OF SHIFT/ JHFRAT NOTE    Admission Date: 06/02/2023  Length of Stay: LOS: 4 days    Acute events, interventions, provider communication: Pt able to have BM today    Patient Interventions and Education  Fall Risk/JHFRAT Interventions and Education: (Charting when applicable)   Total Fall Risk Score: Total Fall Risk Score: 5.   Elimination Interventions : N/A  Medications : N/A  Patient Care Equipment: N/A  Mobility: N/A  Cognition: N/A  Risk for Moderate/Major Injury: N/A  2. BMAT: Bedside Mobility Assessment Tool (BMAT)  Sit and Shake: Pass  Extend and Point: Pass  Stand: Pass  Step: Pass (Green: walk without assistive device)    Restraints:  No       See Docflowsheet for restraint documentation, interventions, education, etc.    Activity:  Mobility: good    If other, explain:     Hygiene:     Bath/Shower: Soap and water shower/bath     Oral Care: Brush, Moisturize     Urinary Catheter / Perineal Care: Self    Intake and Output:           Last Bowel Movement Date: 06/06/23,           Date 06/05/23 0701 - 06/06/23 0700 06/06/23 0701 - 06/07/23 0700   Shift 0701-1900 1901-0700 24 Hour Total 0701-1900 1901-0700 24 Hour Total   INTAKE   P.O. 0 1075 1075 960  960   Shift Total(mL/kg) 0(0) 1075(11.6) 1075(11.6) 960(10.3)  960(10.3)   OUTPUT   Other           Stool Occurrence 0 x 0 x 0 x 2 x  2 x     Urine Occurrence  1 x 3 x 4 x 2 x  2 x   Shift Total(mL/kg)         NET 0 1075 1075 960  960   Weight (kg) 93 93 93 93 93 93

## 2023-06-07 NOTE — Progress Notes
[facility] END OF SHIFT/ JHFRAT NOTE    Admission Date: 06/02/2023  Length of Stay: LOS: 5 days    Acute events, interventions, provider communication: n/a     Patient Interventions and Education  Fall Risk/JHFRAT Interventions and Education: (Charting when applicable)   Total Fall Risk Score: Total Fall Risk Score: 5.   Elimination Interventions : N/A  Medications : N/A  Patient Care Equipment: N/A  Mobility: N/A  Cognition: N/A  Risk for Moderate/Major Injury: N/A  2. BMAT: Bedside Mobility Assessment Tool (BMAT)  Sit and Shake: Pass  Extend and Point: Pass  Stand: Pass  Step: Pass (Green: walk without assistive device)    Restraints:  No       See Docflowsheet for restraint documentation, interventions, education, etc.    Activity:  Mobility: good    If other, explain:     Hygiene:     Bath/Shower: Soap and water shower/bath     Oral Care: Brush, Moisturize     Urinary Catheter / Perineal Care: Self    Intake and Output:           Last Bowel Movement Date: 06/06/23,           Date 06/06/23 0701 - 06/07/23 0700 06/07/23 0701 - 06/08/23 0700   Shift 0701-1900 1901-0700 24 Hour Total 0701-1900 1901-0700 24 Hour Total   INTAKE   P.O. [phone]      Shift Total(mL/kg) 960(10.3) 475(5.1) 1435(15.4)      OUTPUT   Other           Stool Occurrence 2 x 0 x 2 x        Urine Occurrence  2 x 1 x 3 x      Shift Total(mL/kg)         NET [phone]      Weight (kg) 93 93 93 93 93 93

## 2023-06-07 NOTE — Consults
Dermatology Inpatient Consult Note    Date of Consult:  06/07/2023    Date of Admission:  06/02/2023    Reason for Consult: Generalized pruritis, rash, worsening eosinophilia. Please provide recs for further work-up and treatment.     Assessment/Plan:  Dermatosis  History of IBD on stelara  Elevated LFTs  Chronic leukocytosis and eosinophilia   - Patient with 1.5 weeks of generalized pruritus, jaundice, and hyperbilirubinemia following taking amoxicillin for URI. Hepatology previously consulted and felt consistent with drug-induced liver injury, however anti-mitochondrial antibodies were positive and there is consideration of PSC with his history of IBD. Of note, he is undergoing workup with oncology for chronic leukocytosis and eosinophilia with plans for bone marrow biopsy. 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 Recent URI does raise concern for PLEVA/PLC as well as lymphomatoid papulosis. Low on the differential would eosinophilic dermatosis of hematologic malignancy given his ongoing heme/onc workup for leukocytosis and chronic eosinophilia. Do not favor scabies given absence of findings over webspaces or groin.     Recommendations   - Punch biopsy of right upper arm obtained today and sent for H&E; Vaseline and Band-aid to biopsy site to be changed daily; pt will need suture removal in 14 days (due 06/20/23))  - Consider additional pruritus workup; TSH (last in 2022 was wnl), HIV   - Acute hepatitis panel negative on this admission. Serologies negative   - Start Triamcinolone 0.1% topical ointment BID to areas of rash on body, do NOT use on face/groin/underarms  - Will defer antihistamines given concomitant liver injury  - Continue treatment of hepatic dysfunction per primary/GI; expect pruritus to improve with reduction in hyperbilirubinemia  - Updated photodocumentation obtained today and placed in chart     Patient discussed with Dr. Judi Saa    Thank you for this consultation.     Orlin Hilding, MD      HPI: 42 year old male with history of IBD on Stelara, chronic pancreatitis, cholecystectomy, chronic leukocytosis/eosinophilia admitted as transfer for elevated LFTs and pruritus on 06/02/23. Dermatology consulted on 06/07/23 for pruritus. He was in his usual state of health until shortly before Thanksgiving when he developed a URI and went to urgent care where received augmentin and an inhaler. He then developed itching starting 05/29/23. He also noticed change in urine color and yellowish discoloration to skin. He has otherwise been feeling okay. This pruritus continued until he went to outside hospital and was transferred to [facility] on 06/02/23. At bedside, he describes continued generalized pruritus and zits that pop up on his arms, chest, and back that he endorses scratching. This has only happened once before when he was given oxycodone medication which he has not received recently. No other reported new medications.       ROS: 10-point ROS completed and negative except for as stated in HPI.     Meds:    Current Facility-Administered Medications:     ALPRAZolam (XANAX) tablet 1 mg, 1 mg, Oral, TID PRN, [name], MD, 1 mg at 06/06/23 1837    cholestyramine-aspartame (QUESTRAN LIGHT) 4 gram packet 4 g, 4 g, Oral, BID(10-22), [name], MD, 4 g at 06/07/23 1102    dextrose 50% (D50) syringe 25-50 mL, 12.5-25 g, Intravenous, PRN, Iona Beard, MD    enoxaparin (LOVENOX) syringe 40 mg, 40 mg, Subcutaneous, QDAY(21), Resa Miner, MD, 40 mg at 06/06/23 2025    fluticasone propionate (FLONASE) nasal spray 2 spray, 2 spray, Each Nostril, BID PRN,  Iona Beard, MD    gabapentin (NEURONTIN) capsule 300 mg, 300 mg, Oral, TID, Iona Beard, MD, 300 mg at 06/07/23 1503    insulin aspart (U-100) (NOVOLOG FLEXPEN U-100 INSULIN) injection PEN 0-6 Units, 0-6 Units, Subcutaneous, ACHS (22), Iona Beard, MD, 1 Units at 06/06/23 1833    insulin aspart (U-100) (NOVOLOG FLEXPEN U-100 INSULIN) injection PEN 5 Units, 5 Units, Subcutaneous, TID w/ meals, [name], MD, 5 Units at 06/07/23 1351    insulin glargine (LANTUS SOLOSTAR U-100 INSULIN) injection PEN 16 Units, 16 Units, Subcutaneous, BID, [name], MD, 16 Units at 06/07/23 0912    levothyroxine (SYNTHROID) tablet 25 mcg, 25 mcg, Oral, QDAY 30 min before breakfast, Iona Beard, MD, 25 mcg at 06/07/23 0602    lipase-protease-amylase (ZENPEP 40) capsule 1 capsule, 1 capsule, Oral, w/Snacks, [name], MD, 1 capsule at 06/05/23 2009    lipase-protease-amylase (ZENPEP 40) capsule 2 capsule, 2 capsule, Oral, TID w/ meals, [name], MD, 2 capsule at 06/07/23 1349    melatonin tablet 5 mg, 5 mg, Oral, QHS PRN, Iona Beard, MD, 5 mg at 06/03/23 2126    morphine (MSIR) IR tablet 15 mg, 15 mg, Oral, Q6H PRN, Jonelle Sidle, MD, 15 mg at 06/06/23 1837    nortriptyline (PAMELOR) capsule 50 mg, 50 mg, Oral, QHS, Iona Beard, MD, 50 mg at 06/06/23 2025    ondansetron (ZOFRAN ODT) rapid dissolve tablet 4 mg, 4 mg, Oral, Q6H PRN, 4 mg at 06/04/23 1809 **OR** ondansetron HCL (PF) (ZOFRAN (PF)) injection 4 mg, 4 mg, Intravenous, Q6H PRN, Iona Beard, MD    polyethylene glycol 3350 (MIRALAX) packet 17 g, 1 packet, Oral, QDAY PRN, Iona Beard, MD, 17 g at 06/05/23 0825    sennosides-docusate sodium (SENOKOT-S) tablet 1 tablet, 1 tablet, Oral, QDAY PRN, Iona Beard, MD, 1 tablet at 06/05/23 0825    triamcinolone acetonide (KENALOG) 0.1 % topical ointment, , Topical, BID, [name], MD      Labs:  - CBC with leukocytosis and eosinophilia  - CMP with elevated LFTs, TBili  - Acute hepatitis panel negative 12/4  - CMV negative  - EBV positive for IgG, negative IgM  - HSV negative  - ANA negative, A1AT negative,  - Anti mitcohondrial Ab elevated to 32.8    PE:  Areas Examined (all normal unless noted below):  Head/Face  Neck  Chest/breasts/axillae  Back  Abdomen  Buttocks/groin/genitalia   R upper ext  L upper ext  R lower ext  L lower ext    Pertinent findings include:  - Scattered excoriated small 3-4 mm papules scattered over upper arms, chest, upper back, and lower legs.  - Diffuse jaundice with scleral icterus  - No burrows in webspace area. No nodules in groin.       Procedure Note  Punch Biopsy Procedure Note    Risk and benefits of the above procedure including bleeding, pain, dyspigmentation, scar, infection, recurrence, or nerve damage with loss of muscle function and/or skin sensation were discussed with the patient (or legal guardian) in detail, who afterwards decided to proceed with the procedure.    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  Body Site: see progress note              Preparation:  Alcohol   Anesthesia:  1% lidocaine with epinephrine  Instrument:  4 mm punch  Excison:  Lesion fully excised to mid fat  or fascia  Hemostasis:  Pressure  Closure: simple    Epidermal: 3-0 ethilon  Wound dressing: Vaseline  Wound care instructions given  Suture removal in 14 days  Complications: None  Tolerated well:  Yes  Pathology sent to:  [facility]  Duration of procedure:  >5 minutes      Procedure Time Out Check List:  Prior to the start of the procedure, I personally confirmed the following:    Site Marking Verified: Yes, as appropriate  Patient Identity (name & date of birth): Yes  Procedure: Yes  Site: Yes  Body Part:  see above    The risks of the procedure, including infection, bleeding, pain and skin changes, were discussed with the patient.

## 2023-06-08 ENCOUNTER — Inpatient Hospital Stay: Admit: 2023-06-08 | Discharge: 2023-06-08 | Payer: Private Health Insurance - Indemnity

## 2023-06-08 LAB — POC GLUCOSE
~~LOC~~ BKR POC GLUCOSE: 199 mg/dL — ABNORMAL HIGH (ref 70–100)
~~LOC~~ BKR POC GLUCOSE: 175 mg/dL — ABNORMAL HIGH (ref 70–100)
~~LOC~~ BKR POC GLUCOSE: 200 mg/dL — ABNORMAL HIGH (ref 70–100)
~~LOC~~ BKR POC GLUCOSE: 211 mg/dL — ABNORMAL HIGH (ref 70–100)
~~LOC~~ BKR POC GLUCOSE: 236 mg/dL — ABNORMAL HIGH (ref 70–100)

## 2023-06-08 LAB — TSH WITH FREE T4 REFLEX: ~~LOC~~ BKR TSH: 0.9 [IU]/mL (ref 0.35–5.00)

## 2023-06-08 LAB — HIV 1 & 2 AG-AB SCRN W REFLEX TO HIV CONFIRMATION

## 2023-06-08 MED ORDER — INSULIN ASPART 100 UNIT/ML SC FLEXPEN
6 [IU] | Freq: Three times a day (TID) | SUBCUTANEOUS | 0 refills | Status: DC
Start: 2023-06-08 — End: 2023-06-17

## 2023-06-08 MED ORDER — CALCIUM CARBONATE 200 MG CALCIUM (500 MG) PO CHEW
500 mg | ORAL | 0 refills | Status: DC | PRN
Start: 2023-06-08 — End: 2023-06-17

## 2023-06-08 MED ORDER — INSULIN GLARGINE 100 UNIT/ML (3 ML) SC INJ PEN
18 [IU] | Freq: Two times a day (BID) | SUBCUTANEOUS | 0 refills | Status: DC
Start: 2023-06-08 — End: 2023-06-10

## 2023-06-08 NOTE — Progress Notes
[facility] END OF SHIFT/ JHFRAT NOTE    Admission Date: 06/02/2023  Length of Stay: LOS: 6 days    Acute events, interventions, provider communication: N/a    Patient Interventions and Education  Fall Risk/JHFRAT Interventions and Education: (Charting when applicable)   Total Fall Risk Score: Total Fall Risk Score: 5.   Elimination Interventions : N/A  Medications : N/A  Patient Care Equipment: N/A  Mobility: N/A  Cognition: N/A  Risk for Moderate/Major Injury: N/A  2. BMAT: Bedside Mobility Assessment Tool (BMAT)  Sit and Shake: Pass  Extend and Point: Pass  Stand: Pass  Step: Pass (Green: walk without assistive device)    Restraints:  No       See Docflowsheet for restraint documentation, interventions, education, etc.    Activity:  Mobility: good    If other, explain:     Hygiene:     Bath/Shower: Soap and water shower/bath     Oral Care: Brush, Moisturize     Urinary Catheter / Perineal Care: Self    Intake and Output:           Last Bowel Movement Date: 06/06/23,           Date 06/07/23 0701 - 06/08/23 0700 06/08/23 0701 - 06/09/23 0700   Shift 0701-1900 1901-0700 24 Hour Total 0701-1900 1901-0700 24 Hour Total   INTAKE   P.O.  100 100      Shift Total(mL/kg)  100(1.1) 100(1.1)      OUTPUT   Other           Stool Occurrence 1 x 0 x 1 x        Urine Occurrence  3 x 2 x 5 x      Shift Total(mL/kg)         NET  100 100      Weight (kg) 93 93 93 93 93 93

## 2023-06-08 NOTE — Progress Notes
General Progress Note    Admission Date: 06/02/2023       LOS: 6 days    Date of Service: 06/08/2023    Assessment/Plan:      Alan [name] is a 42 y.o. male. With past medical history of crohn's disease, diabetes, GAD, fibromyalgia presented to [facility] as a transfer from outside hospital for elevated LFTs and itching.      Elevated AST, ALT- improving  Elevated Bilirubin, Alk Phos- increasing  Pruritus  -AST of 132, ALT 320, ALP 325, TB of 5.1 at OSH  -Hepatitis panel negative  -CT Abdomen at OSH shows mild esophagitis, small pericardial effusion, mild atrophic pancreas with faint calcifications due to chronic pancreatitis   -RUQ U/S: Prior cholecystectomy with dilated CBD.  Mild diffuse parenchymal hepatic disease, mild splenomegaly  -Ddx: DILI d/t Augmentin vs biliary pathology (I.e. PSC) 2/2 Crohn's Disease.  -EBV panel w/ positive IgG and negative IgM. CMV negative. HSV not detected. Iron panel WNL. Acute hepatitis panel negative. A1AT WNL. ANA WNL. ASMA was WNL.   -AMA was positive. Total immunoglobulins WNL.   -ERCP without significant abnormalities.  --RUQ [place] w/ prior cholecystectomy. Bile duct dilation has resolved. Mild hepatosplenomegaly. Direct bilirubin significantly elevated. Lipase <3.   -TSH, HIV for additional pruritus work-up. WNL/Negative.     Plan  -Consulted Hepatology -follow-up pathology, fecal calprotectin.   -Continue Cholestyramine 4g BID.   -Start Ursodeoxycholic acid 600mg  BID (initiated 500mg  as 600mg  was not an option).  -Consulted Dermatology with pruritus, leukocytosis and eosinophilia. They obtained biopsy. Suture removal due 06/20/23.   -Initiated Tramcinolone BID, defer anti-histamines at this time.   -Discussed uptrending bilirubin with hepatology 12/10, will continue to monitor with daily labs.    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 He follows with Hematology as an outpatient to address this.   -He denies infectious symptoms at this time.   -Dermatology consulted for rash, eosinophilia.   -Chest x-ray remains negative on 12/10.     Trivial Pericardial Effusion  -Noted incidentally on CT A/P at OSH  -No chest pain, has diffuse pain due to fibromyalgia history  -TTE w/ trivial pericardial effusion     Chronic Pancreatitis  -Continue home pancreatic enzymes supplements.  -Lipase 12/8 <3.     Upper Respiratory Tract Symptoms for the past one week/Sinusitis  -S/p 5 day course of Augmentin, CXR negative x2.   -Continue supportive care.      Crohn's disease history  -Follows up with GI. Denies current black/bloody bowel movements  Plan  -Currently on Stelera every 4 weeks  -Outpatient GI follow up     T2DM  -Last A1c 9.1 (06/05/2021); repeat 9.8% 06/03/23  -PTA on Tresiba 30 units BID, 4 units TIDWM  Plan  -Down-titrated insulin to glargine 18 units BID, aspart LDCF , added back home meal-time insulin.  -Re-uptitrate insulin back to PTA doses as able    Hypothyroidism  -Continue home levothyroxine     Anxiety Disorder  -Continue home Xanax PRN  -Continue nor tryptialine    Constipation-resolved    FEN: diabetic diet  DVT ppx: SCDs, restart lovenox  Code Status: FULL CODE    I spent >35 minutes total time in patient care today, including reviewing the patient's chart, interviewing and examining the patient, counseling the patient on the plan, coordinating the patient's care, and documenting the patient encounter.      [name], MD  ________________________________________________________________________    Subjective  Overnight, there were no acute events.  Patient denies any acute concerns at this time.  He reports he continues to have pruritus.  He says treatment has not been helpful so far.  We reviewed dermatology's recommendations, and discussed he will need suture removal in 2 weeks.    We discussed his bilirubin levels went up again.  Discussed I reached out to pathology regarding this.  We will continue to closely monitor with daily labs.    All questions and concerns were addressed.    Medications  Scheduled Meds:cholestyramine-aspartame (QUESTRAN LIGHT) 4 gram packet 4 g, 4 g, Oral, BID(10-22)  enoxaparin (LOVENOX) syringe 40 mg, 40 mg, Subcutaneous, QDAY(21)  gabapentin (NEURONTIN) capsule 300 mg, 300 mg, Oral, TID  insulin aspart (U-100) (NOVOLOG FLEXPEN U-100 INSULIN) injection PEN 0-6 Units, 0-6 Units, Subcutaneous, ACHS (22)  insulin aspart (U-100) (NOVOLOG FLEXPEN U-100 INSULIN) injection PEN 5 Units, 5 Units, Subcutaneous, TID w/ meals  insulin glargine (LANTUS SOLOSTAR U-100 INSULIN) injection PEN 16 Units, 16 Units, Subcutaneous, BID  levothyroxine (SYNTHROID) tablet 25 mcg, 25 mcg, Oral, QDAY 30 min before breakfast  lipase-protease-amylase (ZENPEP 40) capsule 1 capsule, 1 capsule, Oral, w/Snacks  lipase-protease-amylase (ZENPEP 40) capsule 2 capsule, 2 capsule, Oral, TID w/ meals  nortriptyline (PAMELOR) capsule 50 mg, 50 mg, Oral, QHS  triamcinolone acetonide (KENALOG) 0.1 % topical ointment, , Topical, BID  ursodioL (URSO 250) tablet 500 mg, 500 mg, Oral, BID w/meals    Continuous Infusions:  PRN and Respiratory Meds:ALPRAZolam TID PRN, dextrose 50% (D50) IV PRN, fluticasone propionate BID PRN, melatonin QHS PRN, morphine Q6H PRN, ondansetron Q6H PRN **OR** ondansetron (ZOFRAN) IV Q6H PRN, polyethylene glycol 3350 QDAY PRN, sennosides-docusate sodium QDAY PRN      Objective:                          Vital Signs: Last Filed                 Vital Signs: 24 Hour Range   BP: 99/57 (12/09 2344)  Temp: 36.6 ?C (97.9 ?F) (12/09 2344)  Pulse: 84 (12/09 2344)  Respirations: 17 PER MINUTE (12/09 2344)  SpO2: 96 % (12/09 2344)  O2 Device: None (Room air) (12/09 2344) BP: (90-115)/(57-73)   Temp:  [36.3 ?C (97.3 ?F)-36.8 ?C (98.2 ?F)]   Pulse:  [80-86]   Respirations:  [16 PER MINUTE-18 PER MINUTE]   SpO2:  [95 %-99 %]   O2 Device: None (Room air)   Intensity Pain Scale (Self Report): Asleep (06/08/23 0400) Vitals:    06/02/23 0100 06/02/23 1113   Weight: 93 kg (205 lb 0.4 oz) 93 kg (205 lb 0.4 oz)           Physical Exam    General: awake & oriented, no acute distress  Head: normocephalic, atraumatic  CV: regular rhythm, no murmur/rub/gallop  Lungs: clear to ausculation bilaterally, no wheezes/rhonchi/crackles  Abdomen: soft, non-tender, non-distended, normo-active bowel sounds  Extremities: no clubbing/cyanosis/edema  Neuro: no focal deficits  Derm: multiple excoriation lesions noted    Lab Review  Pertinent labs reviewed    Point of Care Testing  (Last 24 hours)  POC Glucose (Download): (!) 199 (06/07/23 2106)    Radiology and other Diagnostics Review:    Pertinent radiology reviewed.

## 2023-06-09 LAB — CYTOLOGY FNA

## 2023-06-09 LAB — SURGICAL PATHOLOGY: ~~LOC~~ BKR CLINICAL INFORMATION: 0

## 2023-06-09 LAB — MANUAL DIFF
~~LOC~~ BKR BASOPHILS - RELATIVE: 1 % (ref 0–2)
~~LOC~~ BKR EOSINOPHILS - RELATIVE: 7 % — ABNORMAL HIGH (ref 0–5)
~~LOC~~ BKR LYMPHOCYTES - RELATIVE: 40 % (ref 24–44)
~~LOC~~ BKR MONOCYTES - RELATIVE: 12 % (ref 4–12)
~~LOC~~ BKR NEUT+BANDS - ABSOLUTE: 5.5 10*3/uL (ref 1.8–7.0)
~~LOC~~ BKR NEUTROPHILS - RELATIVE: 40 % — ABNORMAL LOW (ref 41–77)

## 2023-06-09 LAB — POC GLUCOSE
~~LOC~~ BKR POC GLUCOSE: 177 mg/dL — ABNORMAL HIGH (ref 70–100)
~~LOC~~ BKR POC GLUCOSE: 161 mg/dL — ABNORMAL HIGH (ref 70–100)
~~LOC~~ BKR POC GLUCOSE: 199 mg/dL — ABNORMAL HIGH (ref 70–100)
~~LOC~~ BKR POC GLUCOSE: 250 mg/dL — ABNORMAL HIGH (ref 70–100)

## 2023-06-09 LAB — CALPROTECTIN, FECAL: ~~LOC~~ BKR CALPROTECTIN, FECAL: 492 ug/g — ABNORMAL HIGH (ref <=49)

## 2023-06-09 MED ORDER — TRAZODONE 50 MG PO TAB
50 mg | Freq: Every evening | ORAL | 0 refills | Status: DC | PRN
Start: 2023-06-09 — End: 2023-06-17
  Administered 2023-06-10 – 2023-06-17 (×6): 50 mg via ORAL

## 2023-06-09 MED ORDER — DIPHENHYDRAMINE HCL 25 MG PO CAP
25 mg | Freq: Once | ORAL | 0 refills | Status: CP
Start: 2023-06-09 — End: ?
  Administered 2023-06-09: 07:00:00 25 mg via ORAL

## 2023-06-09 MED ORDER — CHOLESTYRAMINE-ASPARTAME 4 GRAM PO PWPK
8 g | Freq: Two times a day (BID) | ORAL | 0 refills | Status: DC
Start: 2023-06-09 — End: 2023-06-17
  Administered 2023-06-10 – 2023-06-17 (×15): 8 g via ORAL

## 2023-06-09 MED ORDER — HYDROXYZINE HCL 25 MG PO TAB
25 mg | Freq: Once | ORAL | 0 refills | Status: DC
Start: 2023-06-09 — End: 2023-06-10

## 2023-06-09 NOTE — Progress Notes
Dermatology Inpatient Progress Note    Date of Consult:  06/09/2023    Date of Admission:  06/02/2023    Reason for Consult: Generalized pruritis, rash, worsening eosinophilia. Please provide recs for further work-up and treatment.     Assessment/Plan:  Dermatosis  Hyperbilirubinemia  History of IBD on stelara  Elevated LFTs  Chronic leukocytosis and eosinophilia   - Patient with 1.5 weeks of generalized pruritus, jaundice, and hyperbilirubinemia following taking amoxicillin for URI. Hepatology previously consulted and felt consistent with drug-induced liver injury, however anti-mitochondrial antibodies were positive and there is consideration of PSC with his history of IBD. Of note, he is undergoing workup with oncology for chronic leukocytosis and eosinophilia with plans for bone marrow biopsy. 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 Recent URI does raise concern for PLEVA/PLC as well as lymphomatoid papulosis. Low on the differential would eosinophilic dermatosis of hematologic malignancy given his ongoing heme/onc workup for leukocytosis and chronic eosinophilia. Do not favor scabies given absence of findings over webspaces or groin.     Recommendations   - S/p punch biopsy of right upper arm for H&E; Vaseline and Band-aid to biopsy site to be changed daily; pt will need suture removal in 14 days (due 06/20/23))  - Additional pruritus workup wnl: hepatitis panel, HIV, TSH  - Continue Triamcinolone 0.1% topical ointment BID to areas of rash on body, do NOT use on face/groin/underarms  - Discussed other measures to improve pruritus; ice packs, camphor/menthol lotion. Message sent to pharmacist to assist with obtaining.   - Will defer antihistamines given concomitant liver injury  - Continue treatment of hepatic dysfunction per primary/GI; expect pruritus to improve with reduction in hyperbilirubinemia  - Updated photodocumentation obtained today and placed in chart Patient discussed with Dr. Judi Saa    Thank you for this consultation.    Orlin Hilding, MD  Dermatology Resident    HPI: 42 year old male with history of IBD on Stelara, chronic pancreatitis, cholecystectomy, chronic leukocytosis/eosinophilia admitted as transfer for elevated LFTs and pruritus on 06/02/23. Dermatology consulted on 06/07/23 for pruritus. He was in his usual state of health until shortly before Thanksgiving when he developed a URI and went to urgent care where received augmentin and an inhaler. He then developed itching starting 05/29/23. He also noticed change in urine color and yellowish discoloration to skin. He has otherwise been feeling okay. This pruritus continued until he went to outside hospital and was transferred to [facility] on 06/02/23. At bedside, he describes continued generalized pruritus and zits that pop up on his arms, chest, and back that he endorses scratching. This has only happened once before when he was given oxycodone medication which he has not received recently. No other reported new medications.       ROS: 10-point ROS completed and negative except for as stated in HPI.     Meds:    Current Facility-Administered Medications:     ALPRAZolam (XANAX) tablet 1 mg, 1 mg, Oral, TID PRN, [name], MD, 1 mg at 06/09/23 0111    calcium carbonate (TUMS) chew tablet 500 mg, 500 mg, Oral, Q4H PRN, [name], MD    cholestyramine-aspartame (QUESTRAN LIGHT) 4 gram packet 4 g, 4 g, Oral, BID(10-22), [name], MD, 4 g at 06/09/23 0931    dextrose 50% (D50) syringe 25-50 mL, 12.5-25 g, Intravenous, PRN, Iona Beard, MD    enoxaparin (LOVENOX) syringe 40 mg, 40 mg, Subcutaneous, QDAY(21), Resa Miner, MD, 40 mg  at 06/08/23 2107    fluticasone propionate (FLONASE) nasal spray 2 spray, 2 spray, Each Nostril, BID PRN, Iona Beard, MD    gabapentin (NEURONTIN) capsule 300 mg, 300 mg, Oral, TID, Iona Beard, MD, 300 mg at 06/09/23 9629    hydrOXYzine HCL (ATARAX) tablet 25 mg, 25 mg, Oral, ONCE, [name], MD    insulin aspart (U-100) (NOVOLOG FLEXPEN U-100 INSULIN) injection PEN 0-6 Units, 0-6 Units, Subcutaneous, ACHS (22), Iona Beard, MD, 2 Units at 06/09/23 5284    insulin aspart (U-100) (NOVOLOG FLEXPEN U-100 INSULIN) injection PEN 6 Units, 6 Units, Subcutaneous, TID w/ meals, [name], MD, 6 Units at 06/09/23 1232    insulin glargine (LANTUS SOLOSTAR U-100 INSULIN) injection PEN 18 Units, 18 Units, Subcutaneous, BID, [name], MD, 18 Units at 06/09/23 1324    levothyroxine (SYNTHROID) tablet 25 mcg, 25 mcg, Oral, QDAY 30 min before breakfast, Iona Beard, MD, 25 mcg at 06/09/23 0541    lipase-protease-amylase (ZENPEP 40) capsule 1 capsule, 1 capsule, Oral, w/Snacks, [name], MD, 1 capsule at 06/05/23 2009    lipase-protease-amylase (ZENPEP 40) capsule 2 capsule, 2 capsule, Oral, TID w/ meals, [name], MD, 2 capsule at 06/09/23 1232    melatonin tablet 5 mg, 5 mg, Oral, QHS PRN, Iona Beard, MD, 5 mg at 06/08/23 2107    morphine (MSIR) IR tablet 15 mg, 15 mg, Oral, Q6H PRN, Jonelle Sidle, MD, 15 mg at 06/08/23 2106    nortriptyline (PAMELOR) capsule 50 mg, 50 mg, Oral, QHS, Iona Beard, MD, 50 mg at 06/08/23 2107    ondansetron (ZOFRAN ODT) rapid dissolve tablet 4 mg, 4 mg, Oral, Q6H PRN, 4 mg at 06/04/23 1809 **OR** ondansetron HCL (PF) (ZOFRAN (PF)) injection 4 mg, 4 mg, Intravenous, Q6H PRN, Iona Beard, MD    polyethylene glycol 3350 (MIRALAX) packet 17 g, 1 packet, Oral, QDAY PRN, Iona Beard, MD, 17 g at 06/07/23 1857    sennosides-docusate sodium (SENOKOT-S) tablet 1 tablet, 1 tablet, Oral, QDAY PRN, Iona Beard, MD, 1 tablet at 06/05/23 0825    triamcinolone acetonide (KENALOG) 0.1 % topical ointment, , Topical, BID, [name], MD, Given at 06/08/23 [phone]    ursodioL (URSO 250) tablet 500 mg, 500 mg, Oral, BID w/meals, [name], MD, 500 mg at 06/09/23 2725      Labs:  - CBC with leukocytosis and eosinophilia  - CMP with elevated LFTs, worsening elevated TBili  - Acute hepatitis panel negative 12/4  - CMV negative  - EBV positive for IgG, negative IgM  - HSV negative  - ANA negative, A1AT negative,  - Anti mitcohondrial Ab elevated to 32.8    PE:  Areas Examined (all normal unless noted below):  Head/Face  Neck  Chest/breasts/axillae  Back  Abdomen  Buttocks/groin/genitalia   R upper ext  L upper ext  R lower ext  L lower ext    Pertinent findings include:  - Scattered excoriated small 3-4 mm papules scattered over upper arms, chest, upper back, and lower legs.  - Diffuse jaundice with scleral icterus  - No burrows in webspace area. No nodules in groin.

## 2023-06-09 NOTE — Progress Notes
[facility] END OF SHIFT/ JHFRAT NOTE    Admission Date: 06/02/2023  Length of Stay: LOS: 7 days    Acute events, interventions, provider communication: Pt was very uncomfortable, stayed awake majority of the shift d/t itching.    Patient Interventions and Education  Fall Risk/JHFRAT Interventions and Education: (Charting when applicable)   Total Fall Risk Score: Total Fall Risk Score: 5.   Elimination Interventions : N/A  Medications : N/A  Patient Care Equipment: N/A  Mobility: N/A  Cognition: N/A  Risk for Moderate/Major Injury: Active Anticoagulation  2. BMAT: Bedside Mobility Assessment Tool (BMAT)  Sit and Shake: Pass  Extend and Point: Pass  Stand: Pass  Step: Pass (Green: walk without assistive device)    Restraints:  No       See Docflowsheet for restraint documentation, interventions, education, etc.    Activity:  Mobility: good    If other, explain:     Hygiene:     Bath/Shower: Soap and water shower/bath     Oral Care: Brush, Moisturize     Urinary Catheter / Perineal Care: Self    Intake and Output:           Last Bowel Movement Date: 06/06/23,           Date 06/08/23 0701 - 06/09/23 0700 06/09/23 0701 - 06/10/23 0700   Shift 0701-1900 1901-0700 24 Hour Total 0701-1900 1901-0700 24 Hour Total   INTAKE   P.O.  418 418      Shift Total(mL/kg)  418(4.5) 418(4.5)      OUTPUT   Other           Stool Occurrence 1 x 0 x 1 x        Urine Occurrence  3 x 2 x 5 x      Shift Total(mL/kg)         NET  418 418      Weight (kg) 93 93 93 93 93 93

## 2023-06-09 NOTE — Progress Notes
General Progress Note    Admission Date: 06/02/2023       LOS: 7 days    Date of Service: 06/09/2023    Assessment/Plan:      Alan [name] is a 42 y.o. male. With past medical history of crohn's disease, diabetes, GAD, fibromyalgia presented to [facility] as a transfer from outside hospital for elevated LFTs and itching.      Elevated AST, ALT- improving  Elevated Bilirubin, Alk Phos- increasing  Pruritus likely 2/2 hyperbilirubinemia  -AST of 132, ALT 320, ALP 325, TB of 5.1 at OSH  -Hepatitis panel negative  -CT Abdomen at OSH shows mild esophagitis, small pericardial effusion, mild atrophic pancreas with faint calcifications due to chronic pancreatitis   -RUQ U/S: Prior cholecystectomy with dilated CBD.  Mild diffuse parenchymal hepatic disease, mild splenomegaly  -Ddx: DILI d/t Augmentin vs biliary pathology (I.e. PSC) 2/2 Crohn's Disease.  -EBV panel w/ positive IgG and negative IgM. CMV negative. HSV not detected. Iron panel WNL. Acute hepatitis panel negative. A1AT WNL. ANA WNL. ASMA was WNL.   -AMA was positive. Total immunoglobulins WNL.   -ERCP without significant abnormalities.  --RUQ [place] w/ prior cholecystectomy. Bile duct dilation has resolved. Mild hepatosplenomegaly. Direct bilirubin significantly elevated. Lipase <3.   -TSH, HIV for additional pruritus work-up. WNL/Negative.   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 No carcinoma identified in sampled specimen.   -Fecal calprotectin elevated at 492.    Plan  -Consulted Hepatology, they have now signed off.   -Continue Cholestyramine 4g BID. Will increase to 8g BID 12/11.   -Start Ursodeoxycholic acid 600mg  BID (initiated 500mg  as 600mg  was not an option inpatient).  -Consulted Dermatology with pruritus, leukocytosis and eosinophilia. They obtained biopsy. Suture removal due 06/20/23.   -Initiated Tramcinolone BID, defer anti-histamines at this time.   -Discussed uptrending bilirubin with hepatology 12/10, will continue to monitor with daily labs.    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 He follows with Hematology as an outpatient to address this. He last saw them 03/2023 with plans to repeat CBC and return to clinic in 3 months.   -He denies infectious symptoms at this time.   -Dermatology consulted for rash, eosinophilia, elevated AST/ALT.   -Chest x-ray remains negative on 12/10.     Trivial Pericardial Effusion  -Noted incidentally on CT A/P at OSH  -No chest pain, has diffuse pain due to fibromyalgia history  -TTE w/ trivial pericardial effusion     Chronic Pancreatitis  -Continue home pancreatic enzymes supplements.  -Lipase 12/8 <3.     Upper Respiratory Tract Symptoms for the past one week/Sinusitis  -S/p 5 day course of Augmentin, CXR negative x2.   -Continue supportive care.      Crohn's disease history  -Follows up with GI. Denies current black/bloody bowel movements  Plan  -Currently on Stelera every 4 weeks  -Outpatient GI follow up     T2DM  -Last A1c 9.1 (06/05/2021); repeat 9.8% 06/03/23  -PTA on Tresiba 30 units BID, 4 units TIDWM  Plan  -Down-titrated insulin to glargine 18 units BID, aspart LDCF , aspart TID w/ meals  -Re-uptitrate insulin back to PTA doses as able    Hypothyroidism  -Continue home levothyroxine     Anxiety Disorder  -Continue home Xanax PRN  -Continue nor tryptialine    Constipation-resolved    Hyponatremia  -Mild, worsened 12/11 due to hyperglycemia (pseudo hyponatremia).  FEN: diabetic diet  DVT ppx: SCDs, lovenox  Code Status: FULL CODE    I spent >35 minutes total time in patient care today, including reviewing the patient's chart, interviewing and examining the patient, counseling the patient on the plan, coordinating the patient's care, and documenting the patient encounter.      [name], MD      ________________________________________________________________________    Subjective  Overnight, there were no acute events.  Patient denies any acute concerns at this time.  He reports he continues to have pruritus, and is very frustrated by this. He says this limits sleep.  He says treatment has not been helpful so far.  We reviewed dermatology's recommendations, and discussed I will increase his cholestyramine today.     We discussed his bilirubin levels went up again.  Discussed my recent conversation with Hepatology that they believe this is lagging behind AST/ALT as far as improvement, and agree with plan to monitor daily CMP until this improves.     All questions and concerns were addressed.    Medications  Scheduled Meds:cholestyramine-aspartame (QUESTRAN LIGHT) 4 gram packet 4 g, 4 g, Oral, BID(10-22)  enoxaparin (LOVENOX) syringe 40 mg, 40 mg, Subcutaneous, QDAY(21)  gabapentin (NEURONTIN) capsule 300 mg, 300 mg, Oral, TID  insulin aspart (U-100) (NOVOLOG FLEXPEN U-100 INSULIN) injection PEN 0-6 Units, 0-6 Units, Subcutaneous, ACHS (22)  insulin aspart (U-100) (NOVOLOG FLEXPEN U-100 INSULIN) injection PEN 6 Units, 6 Units, Subcutaneous, TID w/ meals  insulin glargine (LANTUS SOLOSTAR U-100 INSULIN) injection PEN 18 Units, 18 Units, Subcutaneous, BID  levothyroxine (SYNTHROID) tablet 25 mcg, 25 mcg, Oral, QDAY 30 min before breakfast  lipase-protease-amylase (ZENPEP 40) capsule 1 capsule, 1 capsule, Oral, w/Snacks  lipase-protease-amylase (ZENPEP 40) capsule 2 capsule, 2 capsule, Oral, TID w/ meals  nortriptyline (PAMELOR) capsule 50 mg, 50 mg, Oral, QHS  triamcinolone acetonide (KENALOG) 0.1 % topical ointment, , Topical, BID  ursodioL (URSO 250) tablet 500 mg, 500 mg, Oral, BID w/meals    Continuous Infusions:  PRN and Respiratory Meds:ALPRAZolam TID PRN, calcium carbonate Q4H PRN, dextrose 50% (D50) IV PRN, fluticasone propionate BID PRN, melatonin QHS PRN, morphine Q6H PRN, ondansetron Q6H PRN **OR** ondansetron (ZOFRAN) IV Q6H PRN, polyethylene glycol 3350 QDAY PRN, sennosides-docusate sodium QDAY PRN      Objective:                          Vital Signs: Last Filed                 Vital Signs: 24 Hour Range   BP: 100/69 (12/11 0724)  Temp: 36.6 ?C (97.9 ?F) (12/11 2841)  Pulse: 71 (12/11 0724)  Respirations: 16 PER MINUTE (12/11 0724)  SpO2: 96 % (12/11 0724)  O2 Device: None (Room air) (12/11 0724) BP: (92-106)/(59-73)   Temp:  [36.6 ?C (97.9 ?F)-37.1 ?C (98.7 ?F)]   Pulse:  [71-82]   Respirations:  [16 PER MINUTE-17 PER MINUTE]   SpO2:  [94 %-97 %]   O2 Device: None (Room air)     Vitals:    06/02/23 0100 06/02/23 1113   Weight: 93 kg (205 lb 0.4 oz) 93 kg (205 lb 0.4 oz)           Physical Exam    General: awake & oriented, no acute distress  Head: normocephalic, atraumatic  CV: regular rhythm, no murmur/rub/gallop  Lungs: clear to ausculation bilaterally, no wheezes/rhonchi/crackles  Abdomen: soft, non-tender, non-distended, normo-active bowel sounds  Extremities: no clubbing/cyanosis/edema  Neuro: no focal deficits  Derm: multiple excoriation lesions noted    Lab Review  Pertinent labs reviewed    Point of Care Testing  (Last 24 hours)  Glucose: (!) 249 (06/09/23 0630)  POC Glucose (Download): (!) 250 (06/09/23 1610)    Radiology and other Diagnostics Review:    Pertinent radiology reviewed.

## 2023-06-09 NOTE — Progress Notes
[facility] END OF SHIFT/ JHFRAT NOTE    Admission Date: 06/02/2023  Length of Stay: LOS: 7 days    Acute events, interventions, provider communication: Patient continues to express frustration and feelings r/t treatment plan and discomfort caused by itching. Team aware. No new concerns at this time. VSS. Care ongoing     Patient Interventions and Education  Fall Risk/JHFRAT Interventions and Education: (Charting when applicable)   Total Fall Risk Score: Total Fall Risk Score: 5.   Elimination Interventions : N/A  Medications : N/A  Patient Care Equipment: Does not need assistance with patient care equipment when ambulating and Ensure environment is free of clutter and walkways are clear from tripping hazards  Mobility: N/A  Cognition: N/A  Risk for Moderate/Major Injury: N/A  2. BMAT: Bedside Mobility Assessment Tool (BMAT)  Sit and Shake: Pass  Extend and Point: Pass  Stand: Pass  Step: Pass (Green: walk without assistive device)    Restraints:  No       See Docflowsheet for restraint documentation, interventions, education, etc.    Activity:  Mobility: good    If other, explain:     Hygiene:     Bath/Shower: Soap and water shower/bath     Oral Care: Brush, Moisturize     Urinary Catheter / Perineal Care: Self    Intake and Output:           Last Bowel Movement Date: 06/06/23,           Date 06/08/23 1901 - 06/09/23 0700 06/09/23 0701 - 06/10/23 0700   Shift 1901-0700 24 Hour Total 0701-1900 1901-0700 24 Hour Total   INTAKE   P.O. 418 418      Shift Total(mL/kg) 418(4.5) 418(4.5)      OUTPUT   Other          Stool Occurrence 0 x 1 x 1 x  1 x     Urine Occurrence  2 x 5 x 4 x  4 x   Shift Total(mL/kg)        NET 418 418      Weight (kg) 93 93 89.8 89.8 89.8

## 2023-06-10 ENCOUNTER — Encounter: Admit: 2023-06-10 | Discharge: 2023-06-10 | Payer: Private Health Insurance - Indemnity

## 2023-06-10 LAB — POC GLUCOSE
~~LOC~~ BKR POC GLUCOSE: 193 mg/dL — ABNORMAL HIGH (ref 70–100)
~~LOC~~ BKR POC GLUCOSE: 207 mg/dL — ABNORMAL HIGH (ref 70–100)
~~LOC~~ BKR POC GLUCOSE: 233 mg/dL — ABNORMAL HIGH (ref 70–100)
~~LOC~~ BKR POC GLUCOSE: 262 mg/dL — ABNORMAL HIGH (ref 70–100)

## 2023-06-10 LAB — URINALYSIS DIPSTICK
~~LOC~~ BKR LEUKOCYTES: NEGATIVE mg/dL — ABNORMAL HIGH (ref 7–25)
~~LOC~~ BKR NITRITE: NEGATIVE g/dL (ref 32.0–36.0)
~~LOC~~ BKR URINE ASCORBIC ACID, UA: NEGATIVE mg/dL (ref 0.40–1.24)
~~LOC~~ BKR URINE BILE: POSITIVE % — AB (ref 40.0–50.0)
~~LOC~~ BKR URINE BLOOD: NEGATIVE mmol/L — ABNORMAL HIGH (ref 3.5–5.1)
~~LOC~~ BKR URINE KETONE: NEGATIVE g/dL (ref 13.5–16.5)
~~LOC~~ BKR URINE PH: 5 mosm/kg (ref 5.0–8.0)
~~LOC~~ BKR URINE SPEC GRAVITY: 1 /HPF (ref 1.005–1.030)

## 2023-06-10 LAB — SODIUM-URINE RANDOM: ~~LOC~~ BKR UR SODIUM, RAN: 121 mmol/L

## 2023-06-10 LAB — MANUAL DIFF
~~LOC~~ BKR EOSINOPHILS - RELATIVE: 8 % — ABNORMAL HIGH (ref 0–5)
~~LOC~~ BKR LYMPHOCYTES - RELATIVE: 31 % (ref 24–44)
~~LOC~~ BKR MONOCYTES - RELATIVE: 8 % (ref 4–12)
~~LOC~~ BKR NEUT+BANDS - ABSOLUTE: 8.1 10*3/uL — ABNORMAL HIGH (ref 1.8–7.0)
~~LOC~~ BKR NEUTROPHILS - RELATIVE: 53 % (ref 41–77)

## 2023-06-10 LAB — URINALYSIS, MICROSCOPIC

## 2023-06-10 LAB — CORTISOL-AM: ~~LOC~~ BKR CORTISOL-AM: 12 g/dL (ref 6.7–22.6)

## 2023-06-10 MED ORDER — CAMPHOR-MENTHOL 0.5-0.5 % TP LOTN
Freq: Three times a day (TID) | TOPICAL | 0 refills | Status: DC | PRN
Start: 2023-06-10 — End: 2023-06-17
  Administered 2023-06-11: 04:00:00 222.0000 mL via TOPICAL

## 2023-06-10 MED ORDER — INSULIN GLARGINE 100 UNIT/ML (3 ML) SC INJ PEN
22 [IU] | Freq: Two times a day (BID) | SUBCUTANEOUS | 0 refills | Status: DC
Start: 2023-06-10 — End: 2023-06-11

## 2023-06-10 MED ORDER — PANTOPRAZOLE 40 MG PO TBEC
40 mg | Freq: Two times a day (BID) | ORAL | 0 refills | Status: DC
Start: 2023-06-10 — End: 2023-06-17
  Administered 2023-06-11 – 2023-06-17 (×14): 40 mg via ORAL

## 2023-06-10 NOTE — Progress Notes
BH 53 END OF SHIFT/ JHFRAT NOTE    Admission Date: 06/02/2023  Length of Stay: LOS: 8 days    Acute events, interventions, provider communication: pt is ual    Patient Interventions and Education  Fall Risk/JHFRAT Interventions and Education: (Charting when applicable)   Total Fall Risk Score: Total Fall Risk Score: 5.   Elimination Interventions : N/A  Medications : N/A  Patient Care Equipment: N/A  Mobility: N/A  Cognition: N/A  Risk for Moderate/Major Injury: N/A  2. BMAT: Bedside Mobility Assessment Tool (BMAT)  Sit and Shake: Pass  Extend and Point: Pass  Stand: Pass  Step: Pass (Green: walk without assistive device)    Restraints:  No       See Docflowsheet for restraint documentation, interventions, education, etc.    Activity:  Mobility: good    If other, explain:     Hygiene:     Bath/Shower: Soap and water shower/bath     Oral Care: Moisturize, Brush     Urinary Catheter / Perineal Care: Self    Intake and Output:           Last Bowel Movement Date: 06/08/23,           Date 06/09/23 0701 - 06/10/23 0700 06/10/23 0701 - 06/11/23 0700   Shift 0701-1900 1901-0700 24 Hour Total 0701-1900 1901-0700 24 Hour Total   INTAKE   P.O.    200  200   Shift Total(mL/kg)    200(2.2)  200(2.2)   OUTPUT   Other           Stool Occurrence 1 x 0 x 1 x 0 x  0 x     Urine Occurrence  4 x 0 x 4 x 0 x  0 x   Shift Total(mL/kg)         NET    200  200   Weight (kg) 89.8 89.8 89.8 89.8 89.8 89.8

## 2023-06-10 NOTE — Progress Notes
BH 53 END OF SHIFT/ JHFRAT NOTE    Admission Date: 06/02/2023  Length of Stay: LOS: 8 days    Acute events, interventions, provider communication: No acute changes noted.    Patient Interventions and Education  Fall Risk/JHFRAT Interventions and Education: (Charting when applicable)   Total Fall Risk Score: Total Fall Risk Score: 5.   Elimination Interventions : Place male/male urinal within reach   Medications : N/A  Patient Care Equipment: N/A  Mobility: N/A  Cognition: N/A  Risk for Moderate/Major Injury: N/A  2. BMAT: Bedside Mobility Assessment Tool (BMAT)  Sit and Shake: Pass  Extend and Point: Pass  Stand: Pass  Step: Pass (Green: walk without assistive device)    Restraints:  No       See Docflowsheet for restraint documentation, interventions, education, etc.    Activity:  Mobility: good    If other, explain:     Hygiene:     Bath/Shower: Soap and water shower/bath     Oral Care: Brush, Moisturize     Urinary Catheter / Perineal Care: Self    Intake and Output:           Last Bowel Movement Date: 06/08/23,           Date 06/09/23 0701 - 06/10/23 0700 06/10/23 0701 - 06/11/23 0700   Shift 0701-1900 1901-0700 24 Hour Total 0701-1900 1901-0700 24 Hour Total   INTAKE   Shift Total(mL/kg)         OUTPUT   Other           Stool Occurrence 1 x 0 x 1 x        Urine Occurrence  4 x 0 x 4 x      Shift Total(mL/kg)         NET         Weight (kg) 89.8 89.8 89.8 89.8 89.8 89.8

## 2023-06-10 NOTE — Care Coordination-Inpatient
INPATIENT HEPATOLOGY CARE COORDINATION    Patient: Alan [name]  MRN: [id number]  06/10/2023    Admit Date:06/02/2023  Discharge Dates: TBD  Discharge Disposition: Anticipate Home    Rounding Physician: Dr Jan Fireman (Hepatology sign off 06/07/23)    Brief narrative regarding inpatient admission: 42 y.o. male. With past medical history of crohn's disease, diabetes, GAD, fibromyalgia presented to [facility] as a transfer from outside hospital for elevated LFTs and itching.     Hepatology Follow-up: Close follow up w/ Dr Telford Nab clinic has been requested. Patient expressed frustration w/ inability to complete liver biopsy this admission given his condition is not seeming to be improving. Patient sited transportation challenges, out of pocket cost, as well as potential scheduling delays w/ the holiday approaching.     Will forward patient's concerns on to the inpatient Hepatology team for review. Will have the team follow up with him later today vs tomorrow.    Patient would be able to get lab monitoring locally at [facility] in [place].       Appointment Provider/Date/Time: TBD   Primary Hepatology CNC: TBD  Primary Hepatologist: Dr Dorothey Baseman     ~Please reach out with questions. Discharge information will be sent to the clinic hepatology team for ongoing outpatient management after DC.     Francis Dowse, RN-BSN  Inpatient Hepatology Nurse Coordinator  [employer]

## 2023-06-10 NOTE — Progress Notes
General Progress Note    Admission Date: 06/02/2023       LOS: 8 days    Date of Service: 06/10/2023    Assessment/Plan:      Alan [name] is a 42 y.o. male. With past medical history of crohn's disease, diabetes, GAD, fibromyalgia presented to [facility] as a transfer from outside hospital for elevated LFTs and itching.      Elevated AST, ALT- improving  Elevated Bilirubin, Alk Phos- increasing  Pruritus likely 2/2 hyperbilirubinemia  -AST of 132, ALT 320, ALP 325, TB of 5.1 at OSH  -Hepatitis panel negative  -CT Abdomen at OSH shows mild esophagitis, small pericardial effusion, mild atrophic pancreas with faint calcifications due to chronic pancreatitis   -RUQ U/S: Prior cholecystectomy with dilated CBD.  Mild diffuse parenchymal hepatic disease, mild splenomegaly  -Ddx: DILI d/t Augmentin vs biliary pathology (I.e. PSC) 2/2 Crohn's Disease.  -EBV panel w/ positive IgG and negative IgM. CMV negative. HSV not detected. Iron panel WNL. Acute hepatitis panel negative. A1AT WNL. ANA WNL. ASMA was WNL.   -AMA was positive. Total immunoglobulins WNL.   -ERCP without significant abnormalities.  --RUQ [place] w/ prior cholecystectomy. Bile duct dilation has resolved. Mild hepatosplenomegaly. Direct bilirubin significantly elevated. Lipase <3.   -TSH, HIV for additional pruritus work-up. WNL/Negative.   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 No carcinoma identified in sampled specimen.   -Fecal calprotectin elevated at 492.    Plan  -Consulted Hepatology, they have now signed off.   -Continue Cholestyramine 4g BID. Will increase to 8g BID 12/11.   -Start Ursodeoxycholic acid 600mg  BID (initiated 500mg  as 600mg  was not an option inpatient).  -Consulted Dermatology with pruritus, leukocytosis and eosinophilia. They obtained biopsy. Suture removal due 06/20/23.   -Initiated Tramcinolone BID, defer anti-histamines at this time.   -Discussed uptrending bilirubin with hepatology 12/10, will continue to monitor with daily labs.    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 He follows with Hematology as an outpatient to address this. He last saw them 03/2023 with plans to repeat CBC and return to clinic in 3 months.   -He denies infectious symptoms at this time.   -Dermatology consulted for rash, eosinophilia, elevated AST/ALT.   -Chest x-ray remains negative on 12/10.     Trivial Pericardial Effusion  -Noted incidentally on CT A/P at OSH  -No chest pain, has diffuse pain due to fibromyalgia history  -TTE w/ trivial pericardial effusion     Chronic Pancreatitis  -Continue home pancreatic enzymes supplements.  -Lipase 12/8 <3.     Upper Respiratory Tract Symptoms for the past one week/Sinusitis  -S/p 5 day course of Augmentin, CXR negative x2.   -Continue supportive care.      Crohn's disease history  -Follows up with GI. Denies current black/bloody bowel movements  Plan  -Currently on Stelera every 4 weeks  -Outpatient GI follow up     T2DM  -Last A1c 9.1 (06/05/2021); repeat 9.8% 06/03/23  -PTA on Tresiba 30 units BID, 4 units TIDWM  Plan  -Down-titrated insulin to glargine 18 units BID, aspart LDCF , aspart TID w/ meals  -Re-uptitrate insulin back to PTA doses as able    Hypothyroidism  -Continue home levothyroxine     Anxiety Disorder  -Continue home Xanax PRN  -Continue nor tryptialine    Constipation-resolved    Isotonic Hyponatremia  -Mild, worsened 12/11 due to hyperglycemia (pseudo hyponatremia).  -  Triglycerides mildly elevated, but probably not enough to lower sodium to this degree  -Will check SPEP    FEN: diabetic diet  DVT ppx: SCDs, lovenox  Code Status: FULL CODE    I spent >50 minutes total time in patient care today, including reviewing the patient's chart, interviewing and examining the patient, counseling the patient on the plan, coordinating the patient's care, and documenting the patient encounter.    Mahlon Gammon, MD  Internal Medicine - Hospitalist   Available on [place] and Pager [phone])  Please use the Med Private First Call for all patient-related communications. Personal Vo[employer]es and pagers are not answered at all hours.    ________________________________________________________________________    Subjective    No acute events overnight.  Slept well for the first time in a few nights.  Last bowel movement was yesterday.  Eating and drinking well.  Discussed plan as above.  No additional acute concerns this morning.    Medications  Scheduled Meds:cholestyramine-aspartame (QUESTRAN LIGHT) 4 gram packet 8 g, 8 g, Oral, BID(10-22)  enoxaparin (LOVENOX) syringe 40 mg, 40 mg, Subcutaneous, QDAY(21)  gabapentin (NEURONTIN) capsule 300 mg, 300 mg, Oral, TID  hydrOXYzine HCL (ATARAX) tablet 25 mg, 25 mg, Oral, ONCE  insulin aspart (U-100) (NOVOLOG FLEXPEN U-100 INSULIN) injection PEN 0-6 Units, 0-6 Units, Subcutaneous, ACHS (22)  insulin aspart (U-100) (NOVOLOG FLEXPEN U-100 INSULIN) injection PEN 6 Units, 6 Units, Subcutaneous, TID w/ meals  insulin glargine (LANTUS SOLOSTAR U-100 INSULIN) injection PEN 18 Units, 18 Units, Subcutaneous, BID  levothyroxine (SYNTHROID) tablet 25 mcg, 25 mcg, Oral, QDAY 30 min before breakfast  lipase-protease-amylase (ZENPEP 40) capsule 1 capsule, 1 capsule, Oral, w/Snacks  lipase-protease-amylase (ZENPEP 40) capsule 2 capsule, 2 capsule, Oral, TID w/ meals  nortriptyline (PAMELOR) capsule 50 mg, 50 mg, Oral, QHS  triamcinolone acetonide (KENALOG) 0.1 % topical ointment, , Topical, BID  ursodioL (URSO 250) tablet 500 mg, 500 mg, Oral, BID w/meals    Continuous Infusions:  PRN and Respiratory Meds:ALPRAZolam TID PRN, calcium carbonate Q4H PRN, dextrose 50% (D50) IV PRN, fluticasone propionate BID PRN, melatonin QHS PRN, morphine Q6H PRN, ondansetron Q6H PRN **OR** ondansetron (ZOFRAN) IV Q6H PRN, polyethylene glycol 3350 QDAY PRN, sennosides-docusate sodium QDAY PRN, traZODone QHS PRN      Objective:                          Vital Signs: Last Filed                 Vital Signs: 24 Hour Range   BP: 96/63 (12/12 0732)  Temp: 36.7 ?C (98.1 ?F) (12/12 0732)  Pulse: 84 (12/12 0732)  Respirations: 16 PER MINUTE (12/12 0732)  SpO2: 97 % (12/12 0732)  O2 Device: None (Room air) (12/12 0732) BP: (91-109)/(63-76)   Temp:  [36.7 ?C (98 ?F)-36.9 ?C (98.5 ?F)]   Pulse:  [77-85]   Respirations:  [16 PER MINUTE]   SpO2:  [95 %-98 %]   O2 Device: None (Room air)     Vitals:    06/02/23 0100 06/02/23 1113 06/09/23 1515   Weight: 93 kg (205 lb 0.4 oz) 93 kg (205 lb 0.4 oz) 89.8 kg (197 lb 15.6 oz)           Physical Exam    General: awake & oriented, no acute distress  Head: normocephalic, atraumatic  CV: regular rhythm, no murmur/rub/gallop  Lungs: clear to ausculation bilaterally, no wheezes/rhonchi/crackles  Abdomen: soft, non-tender, non-distended, normo-active bowel  sounds  Extremities: no clubbing/cyanosis/edema  Neuro: no focal deficits  Derm: multiple excoriation lesions noted    Lab Review  Pertinent labs reviewed    Point of Care Testing  (Last 24 hours)  Glucose: (!) 178 (06/10/23 0627)  POC Glucose (Download): (!) 193 (06/09/23 2114)    Radiology and other Diagnostics Review:    Pertinent radiology reviewed.

## 2023-06-10 NOTE — Care Plan
Problem: Harm to self, low risk of suicide  Goal: Absence of harm to self, low risk of suicide  Outcome: Goal Ongoing     Problem: Tobacco Use  Goal: Knowledge of tobacco-use cessation methods  Outcome: Goal Ongoing     Problem: Discharge Planning  Goal: Participation in plan of care  Outcome: Goal Ongoing  Goal: Knowledge regarding plan of care  Outcome: Goal Ongoing  Goal: Prepared for discharge  Outcome: Goal Ongoing

## 2023-06-11 ENCOUNTER — Encounter: Admit: 2023-06-11 | Discharge: 2023-06-11 | Payer: Private Health Insurance - Indemnity

## 2023-06-11 LAB — MANUAL DIFF
~~LOC~~ BKR LYMPHOCYTES - RELATIVE: 27 % (ref 24–44)
~~LOC~~ BKR MONOCYTES - RELATIVE: 8 % (ref 4–12)
~~LOC~~ BKR NEUTROPHILS - RELATIVE: 48 % (ref 41–77)

## 2023-06-11 LAB — POC GLUCOSE
~~LOC~~ BKR POC GLUCOSE: 182 mg/dL — ABNORMAL HIGH (ref 70–100)
~~LOC~~ BKR POC GLUCOSE: 189 mg/dL — ABNORMAL HIGH (ref 70–100)
~~LOC~~ BKR POC GLUCOSE: 240 mg/dL — ABNORMAL HIGH (ref 70–100)
~~LOC~~ BKR POC GLUCOSE: 269 mg/dL — ABNORMAL HIGH (ref 70–100)

## 2023-06-11 MED ORDER — INSULIN GLARGINE 100 UNIT/ML (3 ML) SC INJ PEN
25 [IU] | Freq: Two times a day (BID) | SUBCUTANEOUS | 0 refills | Status: DC
Start: 2023-06-11 — End: 2023-06-17
  Administered 2023-06-11: 15:00:00 25 [IU] via SUBCUTANEOUS

## 2023-06-11 NOTE — Care Plan
Problem: Harm to self, low risk of suicide  Goal: Absence of harm to self, low risk of suicide  Outcome: Goal Ongoing     Problem: Tobacco Use  Goal: Knowledge of tobacco-use cessation methods  Outcome: Goal Ongoing     Problem: Discharge Planning  Goal: Participation in plan of care  Outcome: Goal Ongoing  Goal: Knowledge regarding plan of care  Outcome: Goal Ongoing  Goal: Prepared for discharge  Outcome: Goal Ongoing

## 2023-06-11 NOTE — Case Management (ED)
Case Management Progress Note    NAME:Alan [name]                          MRN: [id number]              DOB:[birth year]          AGE: 42 y.o.  ADMISSION DATE: 06/02/2023             DAYS ADMITTED: LOS: 9 days      Today's Date: 06/11/2023    PLAN: Patient anticipated to discharge with no identified NCM needs once medically stable, likely Wednesday.    Expected Discharge Date: 06/16/2023   Is Patient Medically Stable: No, Please explain: planning for liver biopsy Monday.  Are there Barriers to Discharge? No    INTERVENTION/DISPOSITION:  Discharge Planning              Discharge Planning: No Needs Identified  NCM reviewed EMR.  NCM attended and participated in [facility] huddle.  Patient not medically stable for discharge. Patient scheduled for liver biopsy on Monday. Patient anticipated to discharge midweek pending results.  No anticipated discharge NCM needs. Will continue to follow.  Transportation                 Support              Support: Huddle/team update, No needs identified  Info or Referral              Information or Referral to [facility]: No Needs Identified  Positive SDOH Domains and Potential Barriers      Medication Needs              Medication Needs: No Needs Identified  Financial              Financial: No Needs Identified  Legal              Legal: No Needs Identified  Other              Other/None: No needs identified  Discharge Disposition                           Selected Continued Care - Admitted Since 06/02/2023    No services have been selected for the patient.       Lilli Few, BSN, RN  Med Private R Nurse Case Manager  [phone] and available on [place]

## 2023-06-11 NOTE — Progress Notes
Chaplain Note:    Rounding    Rilee was sitting up in bed when I entered. He welcomed me inn. Floris spoke of his current hospitalization and medical concerns over the years. Milton voiced his struggle with depression and anxiety. I provided a pastoral presence, active listening, reflective comments.    Admit Date: 06/02/2023                Date/Time:                      User:                                      06/11/2023 12:04 PM Haset [place] Manson Passey                   The On-Call Chaplain is available on [employer] or can be paged via the switchboard [phone]) for urgent and emergent needs.   The Spiritual Care team responds to other requests within 24-hours when submitted as a Chaplain Consult in [facility].

## 2023-06-11 NOTE — Progress Notes
BH 53 END OF SHIFT/ JHFRAT NOTE    Admission Date: 06/02/2023  Length of Stay: LOS: 9 days    Acute events, interventions, provider communication:     Pt is UAL    Patient Interventions and Education  Fall Risk/JHFRAT Interventions and Education: (Charting when applicable)   Total Fall Risk Score: Total Fall Risk Score: 5.   Elimination Interventions : N/A  Medications : N/A  Patient Care Equipment: N/A  Mobility: N/A  Cognition: N/A  Risk for Moderate/Major Injury: N/A  2. BMAT: Bedside Mobility Assessment Tool (BMAT)  Sit and Shake: Pass  Extend and Point: Pass  Stand: Pass  Step: Pass (Green: walk without assistive device)    Restraints:  No       See Docflowsheet for restraint documentation, interventions, education, etc.    Activity:  Mobility: good    If other, explain:     Hygiene:     Bath/Shower: Soap and water shower/bath     Oral Care: Moisturize, Brush     Urinary Catheter / Perineal Care: Self    Intake and Output:      Oral Supplement: Boost breeze    Last Bowel Movement Date: 06/06/23,           Date 06/10/23 0701 - 06/11/23 0700 06/11/23 0701 - 06/12/23 0700   Shift 0701-1900 1901-0700 24 Hour Total 0701-1900 1901-0700 24 Hour Total   INTAKE   P.O. 700 [phone]  1237   Shift Total(mL/kg) 700(7.8) 218(2.4) 918(10.2) 0981(19.1)  4782(95.6)   OUTPUT   Other           Stool Occurrence 0 x 0 x 0 x 1 x  1 x     Urine Occurrence  2 x 1 x 3 x 4 x  4 x   Shift Total(mL/kg)         NET 700 [phone]  1237   Weight (kg) 89.8 89.8 89.8 89.8 89.8 89.8

## 2023-06-11 NOTE — Progress Notes
BH 53 END OF SHIFT/ JHFRAT NOTE    Admission Date: 06/02/2023  Length of Stay: LOS: 9 days    Acute events, interventions, provider communication: N/a    Patient Interventions and Education  Fall Risk/JHFRAT Interventions and Education: (Charting when applicable)   Total Fall Risk Score: Total Fall Risk Score: 5.   Elimination Interventions : N/A  Medications : N/A  Patient Care Equipment: N/A  Mobility: N/A and Assist x1  Cognition: N/A  Risk for Moderate/Major Injury: N/A  2. BMAT: Bedside Mobility Assessment Tool (BMAT)  Sit and Shake: Pass  Extend and Point: Pass  Stand: Pass  Step: Pass (Green: walk without assistive device)    Restraints:  No       See Docflowsheet for restraint documentation, interventions, education, etc.    Activity:  Mobility: good    If other, explain:     Hygiene:     Bath/Shower: Soap and water shower/bath     Oral Care: Moisturize, Brush     Urinary Catheter / Perineal Care: Self    Intake and Output:           Last Bowel Movement Date: 06/06/23,           Date 06/10/23 0701 - 06/11/23 0700 06/11/23 0701 - 06/12/23 0700   Shift 0701-1900 1901-0700 24 Hour Total 0701-1900 1901-0700 24 Hour Total   INTAKE   P.O. 700 218 918      Shift Total(mL/kg) 700(7.8) 218(2.4) 918(10.2)      OUTPUT   Other           Stool Occurrence 0 x 0 x 0 x        Urine Occurrence  2 x 1 x 3 x      Shift Total(mL/kg)         NET 700 218 918      Weight (kg) 89.8 89.8 89.8 89.8 89.8 89.8

## 2023-06-11 NOTE — Progress Notes
General Progress Note    Admission Date: 06/02/2023       LOS: 9 days    Date of Service: 06/11/2023    Assessment/Plan:      Alan [name] is a 42 y.o. male. With past medical history of crohn's disease, diabetes, GAD, fibromyalgia presented to [facility] as a transfer from outside hospital for elevated LFTs and itching.      Elevated AST, ALT- improving  Elevated Bilirubin, Alk Phos- increasing  Pruritus likely 2/2 hyperbilirubinemia  -AST of 132, ALT 320, ALP 325, TB of 5.1 at OSH  -Hepatitis panel negative  -CT Abdomen at OSH shows mild esophagitis, small pericardial effusion, mild atrophic pancreas with faint calcifications due to chronic pancreatitis   -RUQ U/S: Prior cholecystectomy with dilated CBD.  Mild diffuse parenchymal hepatic disease, mild splenomegaly  -Ddx: DILI d/t Augmentin vs biliary pathology (I.e. PSC) 2/2 Crohn's Disease.  -EBV panel w/ positive IgG and negative IgM. CMV negative. HSV not detected. Iron panel WNL. Acute hepatitis panel negative. A1AT WNL. ANA WNL. ASMA was WNL.   -AMA was positive. Total immunoglobulins WNL.   -ERCP without significant abnormalities.  --RUQ [place] w/ prior cholecystectomy. Bile duct dilation has resolved. Mild hepatosplenomegaly. Direct bilirubin significantly elevated. Lipase <3.   -TSH, HIV for additional pruritus work-up. WNL/Negative.   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 No carcinoma identified in sampled specimen.   -Fecal calprotectin elevated at 492.    Plan  -Hepatology has been following peripherally.  I discussed with Hepatology 12/13, recommended that is reasonable to pursue liver biopsy - IR has scheduled for Monday 12/16  -Continue Cholestyramine 4g BID. Will increase to 8g BID 12/11.   -Start Ursodeoxycholic acid 600mg  BID (initiated 500mg  as 600mg  was not an option inpatient).  -Consulted Dermatology with pruritus, leukocytosis and eosinophilia. They obtained biopsy. Suture removal due 06/20/23. -Initiated Tramcinolone BID, defer anti-histamines at this time.   -CMP qday    Leukocytosis  Eosinophilia  -Patient has been noted to have leukocytosis and eosinophilia since 2022. He follows with Hematology as an outpatient to address this. He last saw them 03/2023 with plans to repeat CBC and return to clinic in 3 months.   -He denies infectious symptoms at this time.   -Dermatology consulted for rash, eosinophilia, elevated AST/ALT.   -Chest x-ray remains negative on 12/10.     Trivial Pericardial Effusion  -Noted incidentally on CT A/P at OSH  -No chest pain, has diffuse pain due to fibromyalgia history  -TTE w/ trivial pericardial effusion     Chronic Pancreatitis  -Continue home pancreatic enzymes supplements.  -Lipase 12/8 <3.     Upper Respiratory Tract Symptoms for the past one week/Sinusitis  -S/p 5 day course of Augmentin, CXR negative x2.   -Continue supportive care.      Crohn's disease history  -Follows up with GI. Denies current black/bloody bowel movements  Plan  -Currently on Stelera every 4 weeks  -Outpatient GI follow up     T2DM  -Last A1c 9.1 (06/05/2021); repeat 9.8% 06/03/23  -PTA on Tresiba 30 units BID, 4 units TIDWM  Plan  -Up-titrated insulin to glargine 25 units BID, aspart LDCF , aspart 6 units TID w/ meals    Hypothyroidism  -Continue home levothyroxine     Anxiety Disorder  -Continue home Xanax PRN  -Continue nor tryptialine    Constipation-resolved    Isotonic Hyponatremia  -Mild, worsened 12/11 due to hyperglycemia (pseudo hyponatremia).  -Triglycerides mildly  elevated, but probably not enough to lower sodium to this degree  -Will check SPEP    FEN: diabetic diet  DVT ppx: SCDs, lovenox  Code Status: FULL CODE    I spent >50 minutes total time in patient care today, including reviewing the patient's chart, interviewing and examining the patient, counseling the patient on the plan, coordinating the patient's care, and documenting the patient encounter.    Mahlon Gammon, MD  Internal Medicine - Hospitalist   Available on [place] and Pager [phone])  Please use the Med Private First Call for all patient-related communications. Personal Voaltes and pagers are not answered at all hours.    ________________________________________________________________________    Subjective    No acute events overnight.  This morning, he says that he feels little bit better.  The itching is what bothers him the most, he says that sleeping helps.  He denies that the lotion helps in fact he says the lotion made things worse.  No additional acute concerns this morning.  Discussed the plan as above, patient is in understanding and agreement.    Medications  Scheduled Meds:cholestyramine-aspartame (QUESTRAN LIGHT) 4 gram packet 8 g, 8 g, Oral, BID(10-22)  enoxaparin (LOVENOX) syringe 40 mg, 40 mg, Subcutaneous, QDAY(21)  gabapentin (NEURONTIN) capsule 300 mg, 300 mg, Oral, TID  insulin aspart (U-100) (NOVOLOG FLEXPEN U-100 INSULIN) injection PEN 0-6 Units, 0-6 Units, Subcutaneous, ACHS (22)  insulin aspart (U-100) (NOVOLOG FLEXPEN U-100 INSULIN) injection PEN 6 Units, 6 Units, Subcutaneous, TID w/ meals  insulin glargine (LANTUS SOLOSTAR U-100 INSULIN) injection PEN 25 Units, 25 Units, Subcutaneous, BID  levothyroxine (SYNTHROID) tablet 25 mcg, 25 mcg, Oral, QDAY 30 min before breakfast  lipase-protease-amylase (ZENPEP 40) capsule 1 capsule, 1 capsule, Oral, w/Snacks  lipase-protease-amylase (ZENPEP 40) capsule 2 capsule, 2 capsule, Oral, TID w/ meals  nortriptyline (PAMELOR) capsule 50 mg, 50 mg, Oral, QHS  pantoprazole DR (PROTONIX) tablet 40 mg, 40 mg, Oral, BID  triamcinolone acetonide (KENALOG) 0.1 % topical ointment, , Topical, BID  ursodioL (URSO 250) tablet 500 mg, 500 mg, Oral, BID w/meals    Continuous Infusions:  PRN and Respiratory Meds:ALPRAZolam TID PRN, calcium carbonate Q4H PRN, camphor-menthol TID PRN, dextrose 50% (D50) IV PRN, fluticasone propionate BID PRN, melatonin QHS PRN, morphine Q6H PRN, ondansetron Q6H PRN **OR** ondansetron (ZOFRAN) IV Q6H PRN, polyethylene glycol 3350 QDAY PRN, sennosides-docusate sodium QDAY PRN, traZODone QHS PRN      Objective:                          Vital Signs: Last Filed                 Vital Signs: 24 Hour Range   BP: 103/67 (12/13 0358)  Temp: 36.4 ?C (97.5 ?F) (12/13 0358)  Pulse: 78 (12/13 0358)  Respirations: 16 PER MINUTE (12/13 0358)  SpO2: 94 % (12/13 0358)  O2 Device: None (Room air) (12/13 0358) BP: (94-109)/(57-76)   Temp:  [36.4 ?C (97.5 ?F)-36.9 ?C (98.4 ?F)]   Pulse:  [78-84]   Respirations:  [16 PER MINUTE]   SpO2:  [94 %-98 %]   O2 Device: None (Room air)   Intensity Pain Scale (Self Report): Asleep (06/11/23 0332) Vitals:    06/02/23 0100 06/02/23 1113 06/09/23 1515   Weight: 93 kg (205 lb 0.4 oz) 93 kg (205 lb 0.4 oz) 89.8 kg (197 lb 15.6 oz)           Physical Exam  General: awake & oriented, no acute distress  Head: normocephalic, atraumatic  CV: regular rhythm, no murmur/rub/gallop  Lungs: clear to ausculation bilaterally, no wheezes/rhonchi/crackles  Abdomen: soft, non-tender, non-distended, normo-active bowel sounds  Extremities: no clubbing/cyanosis/edema  Neuro: no focal deficits  Derm: multiple excoriation lesions noted    Lab Review  Pertinent labs reviewed    Point of Care Testing  (Last 24 hours)  POC Glucose (Download): (!) 240 (06/11/23 0736)    Radiology and other Diagnostics Review:    Pertinent radiology reviewed.

## 2023-06-12 LAB — MANUAL DIFF
~~LOC~~ BKR BANDS - RELATIVE: 1 % (ref 0–10)
~~LOC~~ BKR BASOPHILS - RELATIVE: 2 % (ref 0–2)
~~LOC~~ BKR EOSINOPHILS - RELATIVE: 14 % — ABNORMAL HIGH (ref 0–5)
~~LOC~~ BKR LYMPHOCYTES - RELATIVE: 20 % — ABNORMAL LOW (ref 24–44)
~~LOC~~ BKR MONOCYTES - RELATIVE: 7 % (ref 4–12)
~~LOC~~ BKR NEUT+BANDS - ABSOLUTE: 8.8 10*3/uL — ABNORMAL HIGH (ref 1.8–7.0)
~~LOC~~ BKR NEUTROPHILS - RELATIVE: 56 % (ref 41–77)

## 2023-06-12 LAB — POC GLUCOSE
~~LOC~~ BKR POC GLUCOSE: 172 mg/dL — ABNORMAL HIGH (ref 70–100)
~~LOC~~ BKR POC GLUCOSE: 116 mg/dL — ABNORMAL HIGH (ref 70–100)
~~LOC~~ BKR POC GLUCOSE: 142 mg/dL — ABNORMAL HIGH (ref 70–100)
~~LOC~~ BKR POC GLUCOSE: 198 mg/dL — ABNORMAL HIGH (ref 70–100)

## 2023-06-12 MED ORDER — POLYETHYLENE GLYCOL 3350 17 GRAM PO PWPK
1 | Freq: Every day | ORAL | 0 refills | Status: DC
Start: 2023-06-12 — End: 2023-06-17
  Administered 2023-06-13 – 2023-06-17 (×4): 17 g via ORAL

## 2023-06-12 MED ORDER — ALPRAZOLAM 0.5 MG PO TAB
1 mg | ORAL | 0 refills | Status: DC | PRN
Start: 2023-06-12 — End: 2023-06-17
  Administered 2023-06-13 – 2023-06-17 (×4): 1 mg via ORAL

## 2023-06-12 MED ORDER — SENNOSIDES-DOCUSATE SODIUM 8.6-50 MG PO TAB
1 | Freq: Every day | ORAL | 0 refills | Status: DC
Start: 2023-06-12 — End: 2023-06-17
  Administered 2023-06-13 – 2023-06-17 (×5): 1 via ORAL

## 2023-06-12 NOTE — Progress Notes
[facility] END OF SHIFT/ JHFRAT NOTE    Admission Date: 06/02/2023  Length of Stay: LOS: 10 days    Acute events, interventions, provider communication: No acute events overnight.    Patient Interventions and Education  Fall Risk/JHFRAT Interventions and Education: (Charting when applicable)   Total Fall Risk Score: Total Fall Risk Score: 5.   Elimination Interventions : Place male/male urinal within reach   Medications : N/A  Patient Care Equipment: N/A  Mobility: N/A  Cognition: N/A  Risk for Moderate/Major Injury: N/A  2. BMAT: Bedside Mobility Assessment Tool (BMAT)  Sit and Shake: Pass  Extend and Point: Pass  Stand: Pass  Step: Pass (Green: walk without assistive device)    Restraints:  No       See Docflowsheet for restraint documentation, interventions, education, etc.    Activity:  Mobility: good    If other, explain:     Hygiene:     Bath/Shower: Soap and water shower/bath     Oral Care: Moisturize, Brush     Urinary Catheter / Perineal Care: Self    Intake and Output:      Oral Supplement: Boost breeze    Last Bowel Movement Date: 06/06/23,           Date 06/11/23 0701 - 06/12/23 0700 06/12/23 0701 - 06/13/23 0700   Shift 0701-1900 1901-0700 24 Hour Total 0701-1900 1901-0700 24 Hour Total   INTAKE   P.O. 1237 200 1437      Shift Total(mL/kg) 8469(62.9) 200(2.2) 1437(16)      OUTPUT   Other           Stool Occurrence 1 x 0 x 1 x        Urine Occurrence  4 x 0 x 4 x      Shift Total(mL/kg)         NET 1237 200 1437      Weight (kg) 89.8 89.8 89.8 89.8 89.8 89.8

## 2023-06-12 NOTE — Progress Notes
[facility] END OF SHIFT/ JHFRAT NOTE    Admission Date: 06/02/2023  Length of Stay: LOS: 10 days    Acute events, interventions, provider communication: No acute events throughout shift. VSS. Care ongoing.    Patient Interventions and Education  Fall Risk/JHFRAT Interventions and Education: (Charting when applicable)   Total Fall Risk Score: Total Fall Risk Score: 5.   Elimination Interventions : N/A  Medications : N/A  Patient Care Equipment: Ensure environment is free of clutter and walkways are clear from tripping hazards  Mobility: N/A  Cognition: N/A  Risk for Moderate/Major Injury: N/A  2. BMAT: Bedside Mobility Assessment Tool (BMAT)  Sit and Shake: Pass  Extend and Point: Pass  Stand: Pass  Step: Pass (Green: walk without assistive device)    Restraints:  No       See Docflowsheet for restraint documentation, interventions, education, etc.    Activity:  Mobility: good    If other, explain:     Hygiene:     Bath/Shower: Soap and water shower/bath     Oral Care: Moisturize, Brush     Urinary Catheter / Perineal Care: Self    Intake and Output:      Oral Supplement: Boost breeze    Last Bowel Movement Date: 06/06/23,           Date 06/11/23 0701 - 06/12/23 0700 06/12/23 0701 - 06/13/23 0700   Shift 0701-1900 1901-0700 24 Hour Total 0701-1900 1901-0700 24 Hour Total   INTAKE   P.O. 1237 200 1437      Shift Total(mL/kg) 8295(62.1) 200(2.2) 1437(16)      OUTPUT   Other           Stool Occurrence 1 x 0 x 1 x 0 x  0 x     Urine Occurrence  4 x 0 x 4 x 2 x  2 x   Shift Total(mL/kg)         NET 1237 200 1437      Weight (kg) 89.8 89.8 89.8 89.8 89.8 89.8

## 2023-06-12 NOTE — Progress Notes
Internal Medicine Progress Note    Name:  Alan [name]   [id number] Date:  06/12/2023  Admission Date: 06/02/2023  LOS: 10 days                     Assessment/Plan:    Principal Problem:    Elevated LFTs  Active Problems:    Difficult airway    Alan [name] is a 42 y.o. M with a history of Crohns disease, DMII, hypothyroidism, fibromyalgia, anxiety who was transferred from OSH for further workup/management of elevated LFTs.     Elevated AST, ALT- improving  Elevated T bili, alk phos- increasing  Pruritus likely 2/2 hyperbilirubinemia  - AST 132, ALT 320, ALP 325, TB of 5.1 at OSH  - Hepatitis panel negative  - CT abdomen at OSH shows mild esophagitis, small pericardial effusion, mild atrophic pancreas with faint calcifications due to chronic pancreatitis   - Ddx: DILI d/t Augmentin vs biliary pathology (I.e. PSC) 2/2 Crohn's Disease.  - Direct bilirubin significantly elevated. Lipase <3.    - EBV panel w/ positive IgG and negative IgM. CMV negative. HSV not detected. Iron panel wnl. Acute hepatitis panel negative. A1AT, ANA, ASMA, total immunoglobulins were wnl. HIV negative.  - AMA was positive  - ERCP 06/02/23 without significant abnormalities. FNA of porta hepatis lymph node obtained. Pathology resulted as polymorphous population of lymphocytes. No carcinoma identified in sampled specimen.   - RUQ U/S 06/02/23: Prior cholecystectomy with dilated CBD. Mild diffuse parenchymal hepatic disease, mild splenomegaly  - Repeat RUQ [place] 06/06/23: Prior cholecystectomy. Bile duct dilation has resolved. Mild hepatosplenomegaly.   - Fecal calprotectin elevated at 492.  - Hepatology has been following peripherally. After discussion with hepatology team on 12/13, plan to pursue liver biopsy in IR on Monday 12/16  - Continue cholestyramine, increased to 8g BID 12/11.   - Started ursodeoxycholic acid 600mg  BID (initiated 500mg  as 600mg  was not an option inpatient).  - CMP qday  - Holding PTA crestor 40mg  daily     Leukocytosis  Eosinophilia  - Patient has been noted to have leukocytosis and eosinophilia since 2022. He follows with Hematology as an outpatient to address this. He last saw them 03/2023 with plans to repeat CBC and return to clinic in 3 months.   - He denies infectious symptoms at this time.   - CXR negative for acute process. UA not c/w UTI  - Dermatology consulted for rash, eosinophilia, elevated AST/ALT. Performed right upper arm punch biopsy on 06/07/23; pathology demonstrated epidermal necrosis suggestive of excoriation. Will need suture removed on 06/20/23. Initiated triamcinolone BID, defer anti-histamines at this time.      Trivial pericardial effusion  - Noted incidentally on CT A/P at OSH  - No chest pain, has diffuse pain due to fibromyalgia history  - TTE 06/02/23 w/ normal EF, normal CVP, no significant valvular abnormalities, trivial pericardial effusion     Chronic pancreatitis  - Lipase 12/8 <3.  - Continue PTA pancreatic enzymes supplements, morphine IR 15mg  q6h PRN     URI symptoms for one week prior to admission/Sinusitis  - S/p 5 day course of augmentin, CXR negative x2.   - Continue supportive care.      Crohn's disease history  - Follows with GI. Denies current black/bloody bowel movements  - PTA Stelera every 4 weeks    T2DM  - Last A1c 9.1 (06/05/2021); repeat 9.8% 06/03/23  - PTA tresiba 30 units BID, humalog 4 units TIDAC +  SSI, farxiga   - Currently on glargine 25 units BID, aspart 6 units TIDAC, LDCF     Hypothyroidism  - TSH wnl  - Continue PTA levothyroxine 25 mcg daily     Anxiety disorder  - Continue PTA xanax 1mg  q6h PRN, nortriptyline 50mg  qHS    Isotonic hyponatremia  - Mild, worsened 12/11 due to hyperglycemia (pseudo hyponatremia).  - Triglycerides mildly elevated, but probably not enough to lower sodium to this degree  - Urine sodium 121, urine osm 910, serum osm 290  - AM cortisol wnl  - SPEP showed hypogammaglobulinemia   - CTM    FEN: No IVF, replace lytes PRN, diabetic diet  DVT ppx: lovenox  Code status: Full code    Dispo: Continue inpatient care    Total Time Today was 80 minutes in the following activities: Preparing to see the patient, Obtaining and/or reviewing separately obtained history, Performing a medically appropriate examination and/or evaluation, Counseling and educating the patient/family/caregiver, Ordering medications, tests, or procedures, Referring and communication with other health care professionals (when not separately reported), Documenting clinical information in the electronic or other health record, Independently interpreting results (not separately reported) and communicating results to the patient/family/caregiver, and Care coordination (not separately reported)   ________________________________________________________________________    Subjective    No acute events overnight. Patient reports no significant improvement of diffuse itching. He has made more of an effort not to scratch his skin to avoid further damage. He is tolerating oral intake. He is understandably having anxiety related to this most recent illness in addition to recent previous medical issues, this has caused loss of employment. He was hopeful that he would finally have an answer as to what is causing the issue with his liver with the liver biopsy.     Medications  Scheduled Meds:cholestyramine-aspartame (QUESTRAN LIGHT) 4 gram packet 8 g, 8 g, Oral, BID(10-22)  enoxaparin (LOVENOX) syringe 40 mg, 40 mg, Subcutaneous, QDAY(21)  gabapentin (NEURONTIN) capsule 300 mg, 300 mg, Oral, TID  insulin aspart (U-100) (NOVOLOG FLEXPEN U-100 INSULIN) injection PEN 0-6 Units, 0-6 Units, Subcutaneous, ACHS (22)  insulin aspart (U-100) (NOVOLOG FLEXPEN U-100 INSULIN) injection PEN 6 Units, 6 Units, Subcutaneous, TID w/ meals  insulin glargine (LANTUS SOLOSTAR U-100 INSULIN) injection PEN 25 Units, 25 Units, Subcutaneous, BID  levothyroxine (SYNTHROID) tablet 25 mcg, 25 mcg, Oral, QDAY 30 min before breakfast  lipase-protease-amylase (ZENPEP 40) capsule 1 capsule, 1 capsule, Oral, w/Snacks  lipase-protease-amylase (ZENPEP 40) capsule 2 capsule, 2 capsule, Oral, TID w/ meals  nortriptyline (PAMELOR) capsule 50 mg, 50 mg, Oral, QHS  pantoprazole DR (PROTONIX) tablet 40 mg, 40 mg, Oral, BID  triamcinolone acetonide (KENALOG) 0.1 % topical ointment, , Topical, BID  ursodioL (URSO 250) tablet 500 mg, 500 mg, Oral, BID w/meals    Continuous Infusions:  PRN and Respiratory Meds:ALPRAZolam TID PRN, calcium carbonate Q4H PRN, camphor-menthol TID PRN, dextrose 50% (D50) IV PRN, fluticasone propionate BID PRN, melatonin QHS PRN, morphine Q6H PRN, ondansetron Q6H PRN **OR** ondansetron (ZOFRAN) IV Q6H PRN, polyethylene glycol 3350 QDAY PRN, sennosides-docusate sodium QDAY PRN, traZODone QHS PRN      Review of Systems:  All other systems reviewed and are negative.    Objective:                          Vital Signs: Last Filed                 Vital Signs: 24 Hour Range  BP: 93/64 (12/14 1202)  Temp: 36.6 ?C (97.8 ?F) (12/14 1610)  Pulse: 81 (12/14 1202)  Respirations: 17 PER MINUTE (12/14 1202)  SpO2: 98 % (12/14 1202)  O2 Device: None (Room air) (12/14 1202) BP: (93-109)/(64-81)   Temp:  [36.6 ?C (97.8 ?F)-36.9 ?C (98.5 ?F)]   Pulse:  [78-95]   Respirations:  [17 PER MINUTE-18 PER MINUTE]   SpO2:  [97 %-99 %]   O2 Device: None (Room air)     Vitals:    06/02/23 0100 06/02/23 1113 06/09/23 1515   Weight: 93 kg (205 lb 0.4 oz) 93 kg (205 lb 0.4 oz) 89.8 kg (197 lb 15.6 oz)       Intake/Output Summary:  (Last 24 hours)    Intake/Output Summary (Last 24 hours) at 06/12/2023 1206  Last data filed at 06/12/2023 0500  Gross per 24 hour   Intake 600 ml   Output --   Net 600 ml           Physical Exam  General appearance: Alert, cooperative and no distress  Head: NC/AT  Neck: No JVD  Lungs: CTAB  Heart: RRR, no murmur  Abdomen: Non distended  Extremities: No edema BLE  Neurologic: No focal deficit  Skin: Jaundiced. Suture in place at site of right arm punch biopsy    Lab Review  24-hour labs:    Results for orders placed or performed during the hospital encounter of 06/02/23 (from the past 24 hours)   POC GLUCOSE    Collection Time: 06/11/23  4:36 PM   Result Value Ref Range    Glucose, POC 269 (H) 70 - 100 mg/dL   POC GLUCOSE    Collection Time: 06/11/23  9:05 PM   Result Value Ref Range    Glucose, POC 172 (H) 70 - 100 mg/dL   CBC AND DIFF    Collection Time: 06/12/23  7:05 AM   Result Value Ref Range    White Blood Cells 15.50 (H) 4.50 - 11.00 10*3/uL    Red Blood Cells 4.82 4.40 - 5.50 10*6/uL    Hemoglobin 14.6 13.5 - 16.5 g/dL    Hematocrit 96.0 45.4 - 50.0 %    MCV 86.7 80.0 - 100.0 fL    MCH 30.3 26.0 - 34.0 pg    MCHC 34.9 32.0 - 36.0 g/dL    RDW 09.8 (H) 11.9 - 15.0 %    Platelet Count 453 (H) 150 - 400 10*3/uL    MPV 8.0 7.0 - 11.0 fL   COMPREHENSIVE METABOLIC PANEL    Collection Time: 06/12/23  7:05 AM   Result Value Ref Range    Sodium 131 (L) 137 - 147 mmol/L    Potassium 3.6 3.5 - 5.1 mmol/L    Chloride 100 98 - 110 mmol/L    Glucose 139 (H) 70 - 100 mg/dL    Blood Urea Nitrogen 10 7 - 25 mg/dL    Creatinine 1.47 8.29 - 1.24 mg/dL    Calcium 9.0 8.5 - 56.2 mg/dL    Total Protein 6.2 6.0 - 8.0 g/dL    Total Bilirubin 13.0 (H) 0.2 - 1.3 mg/dL    Albumin 3.2 (L) 3.5 - 5.0 g/dL    Alk Phosphatase 865 (H) 25 - 110 U/L    AST 69 (H) 7 - 40 U/L    ALT 101 (H) 7 - 56 U/L    CO2 21 21 - 30 mmol/L    Anion Gap 10 3 - 12  Glomerular Filtration Rate (GFR) >60 >60 mL/min   MANUAL DIFF    Collection Time: 06/12/23  7:05 AM   Result Value Ref Range    ANISO Present     Target Present     Platelet Estimate Slight Increase     Segmented Neutrophils 56 41 - 77 %    Lymphocytes 20 (L) 24 - 44 %    Monocytes 7 4 - 12 %    Eosinophil 14 (H) 0 - 5 %    Bands 1 0 - 10 %    Basophil 2 0 - 2 %    Absolute Neutrophil Count Manual 8.8 (H) 1.8 - 7.0 10*3/uL   POC GLUCOSE    Collection Time: 06/12/23  7:33 AM   Result Value Ref Range Glucose, POC 142 (H) 70 - 100 mg/dL   POC GLUCOSE    Collection Time: 06/12/23 12:00 PM   Result Value Ref Range    Glucose, POC 116 (H) 70 - 100 mg/dL       Point of Care Testing  (Last 24 hours)  Glucose: (!) 139 (06/12/23 0705)  POC Glucose (Download): (!) 116 (06/12/23 1200)    Radiology and other Diagnostics Review:    Pertinent radiology reviewed.    [facility], DO   Pager

## 2023-06-13 LAB — MANUAL DIFF
~~LOC~~ BKR EOSINOPHILS - RELATIVE: 10 % — ABNORMAL HIGH (ref 0–5)
~~LOC~~ BKR LYMPHOCYTES - RELATIVE: 17 % — ABNORMAL LOW (ref 24–44)
~~LOC~~ BKR MONOCYTES - RELATIVE: 11 % (ref 4–12)
~~LOC~~ BKR NEUT+BANDS - ABSOLUTE: 9 10*3/uL — ABNORMAL HIGH (ref 1.8–7.0)
~~LOC~~ BKR NEUTROPHILS - RELATIVE: 62 % (ref 41–77)

## 2023-06-13 LAB — POC GLUCOSE
~~LOC~~ BKR POC GLUCOSE: 236 mg/dL — ABNORMAL HIGH (ref 70–100)
~~LOC~~ BKR POC GLUCOSE: 213 mg/dL — ABNORMAL HIGH (ref 70–100)
~~LOC~~ BKR POC GLUCOSE: 279 mg/dL — ABNORMAL HIGH (ref 70–100)
~~LOC~~ BKR POC GLUCOSE: 283 mg/dL — ABNORMAL HIGH (ref 70–100)

## 2023-06-13 MED ORDER — INSULIN ASPART 100 UNIT/ML SC FLEXPEN
0-12 [IU] | Freq: Before meals | SUBCUTANEOUS | 0 refills | Status: DC
Start: 2023-06-13 — End: 2023-06-17
  Administered 2023-06-15: 14:00:00 4 [IU] via SUBCUTANEOUS

## 2023-06-13 NOTE — Progress Notes
[facility] END OF SHIFT/ JHFRAT NOTE    Admission Date: 06/02/2023  Length of Stay: LOS: 11 days    Acute events, interventions, provider communication:   1126: Notified  team of POC glucose reading 283. No new orders  1730: Notified team of POC glucose of 279. Sliding scale insulin orders adjusted by team     Patient Interventions and Education  Fall Risk/JHFRAT Interventions and Education: (Charting when applicable)   Total Fall Risk Score: Total Fall Risk Score: 5.   Elimination Interventions : N/A  Medications : N/A  Patient Care Equipment: N/A  Mobility: N/A  Cognition: N/A  Risk for Moderate/Major Injury: N/A  2. BMAT: Bedside Mobility Assessment Tool (BMAT)  Sit and Shake: Pass  Extend and Point: Pass  Stand: Pass  Step: Pass (Green: walk without assistive device)    Restraints:  No       See Docflowsheet for restraint documentation, interventions, education, etc.    Activity:  Mobility: good    If other, explain:     Hygiene:     Bath/Shower: Soap and water shower/bath     Oral Care: Moisturize, Brush     Urinary Catheter / Perineal Care: Self    Intake and Output:      Oral Supplement: Boost breeze    Last Bowel Movement Date: 06/06/23,           Date 06/12/23 0701 - 06/13/23 0700 06/13/23 0701 - 06/14/23 0700   Shift 0701-1900 1901-0700 24 Hour Total 0701-1900 1901-0700 24 Hour Total   INTAKE   P.O. 100 675 775      Shift Total(mL/kg) 100(1.1) 675(7.5) 775(8.6)      OUTPUT   Other           Stool Occurrence 1 x 0 x 1 x 1 x  1 x     Urine Occurrence  3 x 1 x 4 x 5 x  5 x   Shift Total(mL/kg)         NET 100 675 775      Weight (kg) 89.8 89.8 89.8 89.8 89.8 89.8

## 2023-06-13 NOTE — Progress Notes
[facility] END OF SHIFT/ JHFRAT NOTE    Admission Date: 06/02/2023  Length of Stay: LOS: 11 days    Acute events, interventions, provider communication: sleep meds given    Patient Interventions and Education  Fall Risk/JHFRAT Interventions and Education: (Charting when applicable)   Total Fall Risk Score: Total Fall Risk Score: 5.   Elimination Interventions : N/A  Medications : N/A  Patient Care Equipment: N/A  Mobility: N/A  Cognition: N/A  Risk for Moderate/Major Injury: N/A  2. BMAT: Bedside Mobility Assessment Tool (BMAT)  Sit and Shake: Pass  Extend and Point: Pass  Stand: Pass  Step: Pass (Green: walk without assistive device)    Restraints:  No       See Docflowsheet for restraint documentation, interventions, education, etc.    Activity:  Mobility: good    If other, explain:     Hygiene:     Bath/Shower: Soap and water shower/bath     Oral Care: Moisturize, Brush     Urinary Catheter / Perineal Care: Self    Intake and Output:      Oral Supplement: Boost breeze    Last Bowel Movement Date: 06/12/23,           Date 06/12/23 0701 - 06/13/23 0700 06/13/23 0701 - 06/14/23 0700   Shift 0701-1900 1901-0700 24 Hour Total 0701-1900 1901-0700 24 Hour Total   INTAKE   P.O. 100 675 775      Shift Total(mL/kg) 100(1.1) 675(7.5) 775(8.6)      OUTPUT   Other           Stool Occurrence 1 x 0 x 1 x        Urine Occurrence  3 x 1 x 4 x      Shift Total(mL/kg)         NET 100 675 775      Weight (kg) 89.8 89.8 89.8 89.8 89.8 89.8

## 2023-06-13 NOTE — Progress Notes
Internal Medicine Progress Note    Name:  Alan [name]   [id number] Date:  06/13/2023  Admission Date: 06/02/2023  LOS: 11 days                     Assessment/Plan:    Principal Problem:    Elevated LFTs  Active Problems:    Difficult airway    Alan [name] is a 42 y.o. M with a history of Crohns disease, DMII, hypothyroidism, fibromyalgia, anxiety who was transferred from OSH for further workup/management of elevated LFTs.     Elevated AST, ALT- improving  Elevated T bili, alk phos- increasing  Pruritus likely 2/2 hyperbilirubinemia  - AST 132, ALT 320, ALP 325, TB of 5.1 at OSH  - Hepatitis panel negative  - CT abdomen at OSH shows mild esophagitis, small pericardial effusion, mild atrophic pancreas with faint calcifications due to chronic pancreatitis   - Ddx: DILI d/t Augmentin vs biliary pathology (I.e. PSC) 2/2 Crohn's Disease.  - Direct bilirubin significantly elevated. Lipase <3.    - EBV panel w/ positive IgG and negative IgM. CMV negative. HSV not detected. Iron panel wnl. Acute hepatitis panel negative. A1AT, ANA, ASMA, total immunoglobulins were wnl. HIV negative.  - AMA was positive  - ERCP 06/02/23 without significant abnormalities. FNA of porta hepatis lymph node obtained. Pathology resulted as polymorphous population of lymphocytes. No carcinoma identified in sampled specimen.   - RUQ U/S 06/02/23: Prior cholecystectomy with dilated CBD. Mild diffuse parenchymal hepatic disease, mild splenomegaly  - Repeat RUQ [place] 06/06/23: Prior cholecystectomy. Bile duct dilation has resolved. Mild hepatosplenomegaly.   - Fecal calprotectin elevated at 492.  - Hepatology has been following peripherally. After discussion with hepatology team on 12/13, plan to pursue liver biopsy in IR on Monday 12/16  - Continue cholestyramine, increased to 8g BID 12/11.   - Started ursodeoxycholic acid 600mg  BID (initiated 500mg  as 600mg  was not an option inpatient).  - CMP qday  - Holding PTA crestor 40mg  daily     Leukocytosis  Eosinophilia  - Patient has been noted to have leukocytosis and eosinophilia since 2022. He follows with Hematology as an outpatient to address this. He last saw them 03/2023 with plans to repeat CBC and return to clinic in 3 months.   - He denies infectious symptoms at this time.   - CXR negative for acute process. UA not c/w UTI  - Dermatology consulted for rash, eosinophilia, elevated AST/ALT. Performed right upper arm punch biopsy on 06/07/23; pathology demonstrated epidermal necrosis suggestive of excoriation. Will need suture removed on 06/20/23. Initiated triamcinolone BID, defer anti-histamines at this time.      Trivial pericardial effusion  - Noted incidentally on CT A/P at OSH  - No chest pain, has diffuse pain due to fibromyalgia history  - TTE 06/02/23 w/ normal EF, normal CVP, no significant valvular abnormalities, trivial pericardial effusion     Chronic pancreatitis  - Lipase 12/8 <3.  - Continue PTA pancreatic enzymes supplements, morphine IR 15mg  q6h PRN     URI symptoms for one week prior to admission/Sinusitis  - S/p 5 day course of augmentin, CXR negative x2.   - Continue supportive care.      Crohn's disease history  - Follows with GI. Denies current black/bloody bowel movements  - PTA Stelera every 4 weeks    T2DM  - Last A1c 9.1 (06/05/2021); repeat 9.8% 06/03/23  - PTA tresiba 30 units BID, humalog 4 units TIDAC +  SSI, farxiga   - Currently on glargine 25 units BID, aspart 6 units TIDAC, LDCF     Hypothyroidism  - TSH wnl  - Continue PTA levothyroxine 25 mcg daily     Anxiety disorder  - Continue PTA xanax 1mg  q6h PRN, nortriptyline 50mg  qHS    Isotonic hyponatremia  - Mild, worsened 12/11 due to hyperglycemia (pseudo hyponatremia).  - Triglycerides mildly elevated, but probably not enough to lower sodium to this degree  - Urine sodium 121, urine osm 910, serum osm 290  - AM cortisol wnl  - SPEP showed hypogammaglobulinemia   - CTM    FEN: No IVF, replace lytes PRN, diabetic diet - NPO at midnight for IR procedure  DVT ppx: lovenox held for liver biopsy  Code status: Full code    Dispo: Continue inpatient care    Total Time Today was 80 minutes in the following activities: Preparing to see the patient, Obtaining and/or reviewing separately obtained history, Performing a medically appropriate examination and/or evaluation, Counseling and educating the patient/family/caregiver, Ordering medications, tests, or procedures, Referring and communication with other health care professionals (when not separately reported), Documenting clinical information in the electronic or other health record, Independently interpreting results (not separately reported) and communicating results to the patient/family/caregiver, and Care coordination (not separately reported)   ________________________________________________________________________    Subjective    No acute events overnight. Patient reports some improvement in terms of intensity of itching. Wife present at bedside, thought skin appeared to be less jaundiced today. He reports decreased appetite, some nausea however has not vomited. Has been trying to increase PO intake. Did have bowel movement.     Medications  Scheduled Meds:cholestyramine-aspartame (QUESTRAN LIGHT) 4 gram packet 8 g, 8 g, Oral, BID(10-22)  enoxaparin (LOVENOX) syringe 40 mg, 40 mg, Subcutaneous, QDAY(21)  gabapentin (NEURONTIN) capsule 300 mg, 300 mg, Oral, TID  insulin aspart (U-100) (NOVOLOG FLEXPEN U-100 INSULIN) injection PEN 0-6 Units, 0-6 Units, Subcutaneous, ACHS (22)  insulin aspart (U-100) (NOVOLOG FLEXPEN U-100 INSULIN) injection PEN 6 Units, 6 Units, Subcutaneous, TID w/ meals  insulin glargine (LANTUS SOLOSTAR U-100 INSULIN) injection PEN 25 Units, 25 Units, Subcutaneous, BID  levothyroxine (SYNTHROID) tablet 25 mcg, 25 mcg, Oral, QDAY 30 min before breakfast  lipase-protease-amylase (ZENPEP 40) capsule 1 capsule, 1 capsule, Oral, w/Snacks  lipase-protease-amylase (ZENPEP 40) capsule 2 capsule, 2 capsule, Oral, TID w/ meals  nortriptyline (PAMELOR) capsule 50 mg, 50 mg, Oral, QHS  pantoprazole DR (PROTONIX) tablet 40 mg, 40 mg, Oral, BID  polyethylene glycol 3350 (MIRALAX) packet 17 g, 1 packet, Oral, QDAY  sennosides-docusate sodium (SENOKOT-S) tablet 1 tablet, 1 tablet, Oral, QDAY  triamcinolone acetonide (KENALOG) 0.1 % topical ointment, , Topical, BID  ursodioL (URSO 250) tablet 500 mg, 500 mg, Oral, BID w/meals    Continuous Infusions:  PRN and Respiratory Meds:ALPRAZolam Q6H PRN, calcium carbonate Q4H PRN, camphor-menthol TID PRN, dextrose 50% (D50) IV PRN, fluticasone propionate BID PRN, melatonin QHS PRN, morphine Q6H PRN, ondansetron Q6H PRN **OR** ondansetron (ZOFRAN) IV Q6H PRN, traZODone QHS PRN      Review of Systems:  All other systems reviewed and are negative.    Objective:                          Vital Signs: Last Filed                 Vital Signs: 24 Hour Range   BP: 112/69 (12/15 0736)  Temp: 36.6 ?  C (97.9 ?F) (12/15 0736)  Pulse: 92 (12/15 0736)  Respirations: 18 PER MINUTE (12/15 0736)  SpO2: 98 % (12/15 0736)  O2 Device: None (Room air) (12/15 0736) BP: (93-119)/(64-82)   Temp:  [36.4 ?C (97.5 ?F)-36.6 ?C (97.9 ?F)]   Pulse:  [81-92]   Respirations:  [17 PER MINUTE-18 PER MINUTE]   SpO2:  [97 %-98 %]   O2 Device: None (Room air)   Intensity Pain Scale (Self Report): 3 (06/13/23 0203) Vitals:    06/02/23 0100 06/02/23 1113 06/09/23 1515   Weight: 93 kg (205 lb 0.4 oz) 93 kg (205 lb 0.4 oz) 89.8 kg (197 lb 15.6 oz)       Intake/Output Summary:  (Last 24 hours)    Intake/Output Summary (Last 24 hours) at 06/13/2023 1914  Last data filed at 06/13/2023 0200  Gross per 24 hour   Intake 775 ml   Output --   Net 775 ml           Physical Exam  General appearance: Alert, cooperative and no distress  Head: NC/AT  Neck: No JVD  Lungs: Breathing non labored   Abdomen: Non distended  Extremities: No edema BLE  Neurologic: No focal deficit  Skin: Jaundiced. Suture in place at site of right arm punch biopsy    Lab Review  24-hour labs:    Results for orders placed or performed during the hospital encounter of 06/02/23 (from the past 24 hours)   POC GLUCOSE    Collection Time: 06/12/23 12:00 PM   Result Value Ref Range    Glucose, POC 116 (H) 70 - 100 mg/dL   POC GLUCOSE    Collection Time: 06/12/23  5:07 PM   Result Value Ref Range    Glucose, POC 198 (H) 70 - 100 mg/dL   POC GLUCOSE    Collection Time: 06/12/23  9:15 PM   Result Value Ref Range    Glucose, POC 236 (H) 70 - 100 mg/dL   CBC AND DIFF    Collection Time: 06/13/23  6:32 AM   Result Value Ref Range    White Blood Cells 14.50 (H) 4.50 - 11.00 10*3/uL    Red Blood Cells 4.65 4.40 - 5.50 10*6/uL    Hemoglobin 13.8 13.5 - 16.5 g/dL    Hematocrit 78.2 95.6 - 50.0 %    MCV 88.8 80.0 - 100.0 fL    MCH 29.7 26.0 - 34.0 pg    MCHC 33.4 32.0 - 36.0 g/dL    RDW 21.3 (H) 08.6 - 15.0 %    Platelet Count 490 (H) 150 - 400 10*3/uL    MPV 7.9 7.0 - 11.0 fL   COMPREHENSIVE METABOLIC PANEL    Collection Time: 06/13/23  6:32 AM   Result Value Ref Range    Sodium 130 (L) 137 - 147 mmol/L    Potassium 3.8 3.5 - 5.1 mmol/L    Chloride 98 98 - 110 mmol/L    Glucose 201 (H) 70 - 100 mg/dL    Blood Urea Nitrogen 9 7 - 25 mg/dL    Creatinine 5.78 4.69 - 1.24 mg/dL    Calcium 8.7 8.5 - 62.9 mg/dL    Total Protein 6.5 6.0 - 8.0 g/dL    Total Bilirubin 8.7 (H) 0.2 - 1.3 mg/dL    Albumin 3.3 (L) 3.5 - 5.0 g/dL    Alk Phosphatase 528 (H) 25 - 110 U/L    AST 59 (H) 7 - 40 U/L    ALT  90 (H) 7 - 56 U/L    CO2 25 21 - 30 mmol/L    Anion Gap 7 3 - 12    Glomerular Filtration Rate (GFR) >60 >60 mL/min   POC GLUCOSE    Collection Time: 06/13/23  7:38 AM   Result Value Ref Range    Glucose, POC 213 (H) 70 - 100 mg/dL       Point of Care Testing  (Last 24 hours)  Glucose: (!) 201 (06/13/23 0865)  POC Glucose (Download): (!) 213 (06/13/23 7846)    Radiology and other Diagnostics Review:    Pertinent radiology reviewed.    Baird [facility], DO   Pager

## 2023-06-13 NOTE — Consults
Interventional Radiology Consult Note      Admission Date: 06/02/2023  LOS: 11 days                       Principal Problem:    Elevated LFTs  Active Problems:    Difficult airway      Reason for consult: Elevated liver function tests     Assessment:   - Admitted from OSH with elevated LFTs and itching  - HX crohn's disease, DM, GAD, fibromyalgia  - Patient aox4, nad with complaint of nausea, jaundice and generalized pain  - Review of imaging significant for mild hepatosplenomegaly  - Labs, medications, and allergies meet procedural protocol.   - Pt is not on a therapeutic blood thinner - will hold lovenox ppx tonight   -   Platelet Count   Date Value Ref Range Status   06/13/2023 490 (H) 150 - 400 10*3/uL Final   ;   INR   Date Value Ref Range Status   12/17/2020 0.9 0.8 - 1.2 Final       Plan:  - Percutaneous liver biopsy, which has been reviewed and added onto the [facility] schedule for 06/14/2023 with anesthesia 2/2 difficult airway banner.   - Procedural risks include: Possible damage to structures (skin, blood vessels, organs, or nerves) through which or near where needles or guidewires are inserted to obtain specimens.  Possible inadequate specimen retrieval.  Organ specific risks can include but are not limited to hemorrhage, air or blood in lungs, or fistula formation.  Pain, either internally or at needle insertion site.  - For sedation purposes, please keep NPO @ MN prior to procedure. May take PO medications with sips of water.    We appreciate being able to participate in this patient's care. Please page with any questions or concerns.    Gus Height, APRN-NP   Pgr [phone]  [facility]    IR Team Pager [phone] (After-hours and Weekends)  __________________________________________________________________      Procedure: Percutaneous liver biopsy     IR Notes:  Consent created.   __________________________________________________________________    Chief Complaint:  Elevated liver function tests    Previous Anesthetic/Sedation History:  Per anesthesia.      Code Status: Full Code    History of present illness:  Alan [name] is a 42 y.o. male patient with hx as noted above in brief. IR consulted for liver biopsy. See ROS below for current symptoms    Review of Systems  Constitutional: negative for fevers and chills; positive for generalized pain   Respiratory: negative for cough or dyspnea  Gastrointestinal: positive for nausea and jaundice    Medications  Scheduled Meds:cholestyramine-aspartame (QUESTRAN LIGHT) 4 gram packet 8 g, 8 g, Oral, BID(10-22)  [Held by Provider] enoxaparin (LOVENOX) syringe 40 mg, 40 mg, Subcutaneous, QDAY(21)  gabapentin (NEURONTIN) capsule 300 mg, 300 mg, Oral, TID  insulin aspart (U-100) (NOVOLOG FLEXPEN U-100 INSULIN) injection PEN 0-6 Units, 0-6 Units, Subcutaneous, ACHS (22)  insulin aspart (U-100) (NOVOLOG FLEXPEN U-100 INSULIN) injection PEN 6 Units, 6 Units, Subcutaneous, TID w/ meals  insulin glargine (LANTUS SOLOSTAR U-100 INSULIN) injection PEN 25 Units, 25 Units, Subcutaneous, BID  levothyroxine (SYNTHROID) tablet 25 mcg, 25 mcg, Oral, QDAY 30 min before breakfast  lipase-protease-amylase (ZENPEP 40) capsule 1 capsule, 1 capsule, Oral, w/Snacks  lipase-protease-amylase (ZENPEP 40) capsule 2 capsule, 2 capsule, Oral, TID w/ meals  nortriptyline (PAMELOR) capsule 50 mg, 50 mg, Oral, QHS  pantoprazole DR (  PROTONIX) tablet 40 mg, 40 mg, Oral, BID  polyethylene glycol 3350 (MIRALAX) packet 17 g, 1 packet, Oral, QDAY  sennosides-docusate sodium (SENOKOT-S) tablet 1 tablet, 1 tablet, Oral, QDAY  triamcinolone acetonide (KENALOG) 0.1 % topical ointment, , Topical, BID  ursodioL (URSO 250) tablet 500 mg, 500 mg, Oral, BID w/meals    Continuous Infusions:  PRN and Respiratory Meds:ALPRAZolam Q6H PRN, calcium carbonate Q4H PRN, camphor-menthol TID PRN, dextrose 50% (D50) IV PRN, fluticasone propionate BID PRN, melatonin QHS PRN, morphine Q6H PRN, ondansetron Q6H PRN **OR** ondansetron (ZOFRAN) IV Q6H PRN, traZODone QHS PRN      Objective                       Vital Signs: Last Filed                 Vital Signs: 24 Hour Range   BP: 91/63 (12/15 1123)  Temp: 36.4 ?C (97.6 ?F) (12/15 1123)  Pulse: 75 (12/15 1123)  Respirations: 18 PER MINUTE (12/15 1123)  SpO2: 98 % (12/15 1123)  O2 Device: None (Room air) (12/15 1123) BP: (91-119)/(63-82)   Temp:  [36.4 ?C (97.5 ?F)-36.6 ?C (97.9 ?F)]   Pulse:  [75-92]   Respirations:  [17 PER MINUTE-18 PER MINUTE]   SpO2:  [97 %-98 %]   O2 Device: None (Room air)   Intensity Pain Scale (Self Report): 3 (06/13/23 0203) Vitals:    06/02/23 0100 06/02/23 1113 06/09/23 1515   Weight: 93 kg (205 lb 0.4 oz) 93 kg (205 lb 0.4 oz) 89.8 kg (197 lb 15.6 oz)         Intake/Output Summary:  (Last 24 hours)    Intake/Output Summary (Last 24 hours) at 06/13/2023 1426  Last data filed at 06/13/2023 0200  Gross per 24 hour   Intake 775 ml   Output --   Net 775 ml           Physical Exam  General appearance: alert and no distress; jaundiced  Neurologic: Grossly normal, at baseline  Lungs: Nonlabored with normal effort      Airway:  Per anesthesia.    Anesthesia Classification:  Per anesthesia.    Pre procedure anxiolysis plan: Per anesthesia.    Intra-procedural Sedation/Medication Plan: Per anesthesia.    Personal history of sedation complications: Per anesthesia.    Family history of sedation complications: Per anesthesia.    Medications for Reversal: Per anesthesia.    Discussion/Reviews:  Physician has discussed risks and alternatives of this type of sedation and above planned procedures with patient  NPO Status: Acceptable   Pregnancy Status: Not Pregnant    Lab/Radiology/Other Diagnostic Tests:  Labs:  Hematology:    Lab Results   Component Value Date    HGB 13.8 06/13/2023    HGB 16.8 01/08/2023    HCT 41.3 06/13/2023    HCT 48.5 01/08/2023    PLTCT 490 06/13/2023    PLTCT 330 01/08/2023    WBC 14.50 06/13/2023    WBC 17.94 01/08/2023    NEUT 34 06/08/2023 NEUT 69.7 01/08/2023    ANC 9.0 06/13/2023    ANC 12.50 01/08/2023    LYMPH 17 06/13/2023    ALC 7.60 06/08/2023    ALC 3.74 01/08/2023    MONA 6 06/08/2023    MONA 7.2 01/08/2023    AMC 0.90 06/08/2023    AMC 1.30 01/08/2023    EOSA 8 06/08/2023    EOSA 4 06/03/2021    ABC 0.30  06/08/2023    ABC 0.10 01/08/2023    BASOPHILS 2 06/12/2023    MCV 88.8 06/13/2023    MCV 86.3 01/08/2023    MCH 29.7 06/13/2023    MCH 29.9 01/08/2023    MCHC 33.4 06/13/2023    MCHC 34.6 01/08/2023    MPV 7.9 06/13/2023    MPV 8.6 01/08/2023    RDW 17.0 06/13/2023    RDW 14.7 01/08/2023   , Coagulation:    Lab Results   Component Value Date    PT 10.5 12/17/2020    PTT 31.4 12/17/2020    INR 0.9 12/17/2020   , General Chemistry:    Lab Results   Component Value Date    NA 130 06/13/2023    NA 134 12/16/2021    K 3.8 06/13/2023    K 4.0 12/16/2021    CL 98 06/13/2023    CL 102 12/16/2021    CO2 25 06/13/2023    CO2 23 12/16/2021    GAP 7 06/13/2023    GAP 9 12/16/2021    BUN 9 06/13/2023    BUN 14 12/16/2021    CR 0.88 06/13/2023    CR 0.81 12/16/2021    GLU 201 06/13/2023    GLU 274 12/16/2021    CA 8.7 06/13/2023    CA 9.1 12/16/2021    ALBUMIN 3.3 06/13/2023    ALBUMIN 4.1 12/16/2021    MG 1.9 12/17/2020    TOTBILI 8.7 06/13/2023    TOTBILI 0.3 12/16/2021     Radiology: Reviewed.

## 2023-06-14 ENCOUNTER — Inpatient Hospital Stay: Admit: 2023-06-14 | Discharge: 2023-06-14 | Payer: Private Health Insurance - Indemnity

## 2023-06-14 ENCOUNTER — Encounter: Admit: 2023-06-14 | Discharge: 2023-06-14 | Payer: Private Health Insurance - Indemnity

## 2023-06-14 ENCOUNTER — Ambulatory Visit: Admit: 2023-06-14 | Discharge: 2023-06-14 | Payer: Private Health Insurance - Indemnity

## 2023-06-14 DIAGNOSIS — R7989 Other specified abnormal findings of blood chemistry: Secondary | ICD-10-CM

## 2023-06-14 LAB — MANUAL DIFF
~~LOC~~ BKR EOSINOPHILS - RELATIVE: 26 % — ABNORMAL HIGH (ref 0–5)
~~LOC~~ BKR LYMPHOCYTES - RELATIVE: 26 % (ref 24–44)
~~LOC~~ BKR MONOCYTES - RELATIVE: 9 % (ref 4–12)
~~LOC~~ BKR NEUT+BANDS - ABSOLUTE: 5.7 10*3/uL (ref 1.8–7.0)
~~LOC~~ BKR NEUTROPHILS - RELATIVE: 39 % — ABNORMAL LOW (ref 41–77)

## 2023-06-14 LAB — POC GLUCOSE
~~LOC~~ BKR POC GLUCOSE: 190 mg/dL — ABNORMAL HIGH (ref 70–100)
~~LOC~~ BKR POC GLUCOSE: 173 mg/dL — ABNORMAL HIGH (ref 70–100)
~~LOC~~ BKR POC GLUCOSE: 177 mg/dL — ABNORMAL HIGH (ref 70–100)
~~LOC~~ BKR POC GLUCOSE: 178 mg/dL — ABNORMAL HIGH (ref 70–100)

## 2023-06-14 MED ORDER — HYDROMORPHONE (PF) 2 MG/ML IJ SYRG
.5-1 mg | INTRAVENOUS | 0 refills | Status: DC | PRN
Start: 2023-06-14 — End: 2023-06-14
  Administered 2023-06-14: 22:00:00 1 mg via INTRAVENOUS

## 2023-06-14 MED ORDER — PROPOFOL 10 MG/ML IV EMUL 100 ML (INFUSION)(AM)(OR)
INTRAVENOUS | 0 refills | Status: DC
Start: 2023-06-14 — End: 2023-06-14
  Administered 2023-06-14: 21:00:00 100 ug/kg/min via INTRAVENOUS

## 2023-06-14 MED ORDER — HYDROMORPHONE (PF) 2 MG/ML IJ SYRG
.5 mg | Freq: Once | INTRAVENOUS | 0 refills | Status: CP
Start: 2023-06-14 — End: ?
  Administered 2023-06-15: 05:00:00 0.5 mg via INTRAVENOUS

## 2023-06-14 MED ORDER — ACETAMINOPHEN 1,000 MG/100 ML (10 MG/ML) IV SOLN
1000 mg | Freq: Once | INTRAVENOUS | 0 refills | Status: CP
Start: 2023-06-14 — End: ?
  Administered 2023-06-15: 04:00:00 1000 mg via INTRAVENOUS

## 2023-06-14 MED ORDER — METOCLOPRAMIDE IVPB
20 mg | Freq: Once | INTRAVENOUS | 0 refills | Status: DC | PRN
Start: 2023-06-14 — End: 2023-06-14

## 2023-06-14 MED ORDER — HALOPERIDOL LACTATE 5 MG/ML IJ SOLN
2.5 mg | Freq: Once | INTRAVENOUS | 0 refills | Status: DC | PRN
Start: 2023-06-14 — End: 2023-06-14

## 2023-06-14 MED ORDER — LIDOCAINE (PF) 20 MG/ML (2 %) IJ SOLN
INTRAVENOUS | 0 refills | Status: DC
Start: 2023-06-14 — End: 2023-06-14

## 2023-06-14 MED ORDER — ACETAMINOPHEN 500 MG PO TAB
1000 mg | Freq: Once | ORAL | 0 refills | Status: DC | PRN
Start: 2023-06-14 — End: 2023-06-14

## 2023-06-14 MED ORDER — HYDROMORPHONE (PF) 2 MG/ML IJ SYRG
.25-.5 mg | INTRAVENOUS | 0 refills | Status: DC | PRN
Start: 2023-06-14 — End: 2023-06-14

## 2023-06-14 MED ORDER — FENTANYL CITRATE (PF) 50 MCG/ML IJ SOLN
25 ug | INTRAVENOUS | 0 refills | Status: DC | PRN
Start: 2023-06-14 — End: 2023-06-14
  Administered 2023-06-14 (×2): 25 ug via INTRAVENOUS

## 2023-06-14 MED ORDER — ONDANSETRON HCL (PF) 4 MG/2 ML IJ SOLN
4 mg | Freq: Once | INTRAVENOUS | 0 refills | Status: CP | PRN
Start: 2023-06-14 — End: ?
  Administered 2023-06-14: 22:00:00 4 mg via INTRAVENOUS

## 2023-06-14 MED ORDER — NALOXONE 0.4 MG/ML IJ SOLN
.04 mg | INTRAVENOUS | 0 refills | Status: DC | PRN
Start: 2023-06-14 — End: 2023-06-14

## 2023-06-14 MED ORDER — HYDROMORPHONE (PF) 2 MG/ML IJ SYRG
1 mg | Freq: Once | INTRAVENOUS | 0 refills | Status: CP
Start: 2023-06-14 — End: ?
  Administered 2023-06-15: 02:00:00 1 mg via INTRAVENOUS

## 2023-06-14 NOTE — Progress Notes
Internal Medicine Progress Note    Name:  Tizoc Oriley   QIONG'E Date:  06/14/2023  Admission Date: 06/02/2023  LOS: 12 days                     Assessment/Plan:    Principal Problem:    Elevated LFTs  Active Problems:    Difficult airway    Alan [name] is a 42 y.o. M with a history of Crohns disease, DMII, hypothyroidism, fibromyalgia, anxiety who was transferred from OSH for further workup/management of elevated LFTs.     Elevated AST, ALT- improving  Elevated T bili, alk phos- increasing  Pruritus likely 2/2 hyperbilirubinemia  - AST 132, ALT 320, ALP 325, TB of 5.1 at OSH  - Hepatitis panel negative  - CT abdomen at OSH shows mild esophagitis, small pericardial effusion, mild atrophic pancreas with faint calcifications due to chronic pancreatitis   - Ddx: DILI d/t Augmentin vs biliary pathology (I.e. PSC) 2/2 Crohn's Disease.  - Direct bilirubin significantly elevated. Lipase <3.    - EBV panel w/ positive IgG and negative IgM. CMV negative. HSV not detected. Iron panel wnl. Acute hepatitis panel negative. A1AT, ANA, ASMA, total immunoglobulins were wnl. HIV negative.  - AMA was positive  - ERCP 06/02/23 without significant abnormalities. FNA of porta hepatis lymph node obtained. Pathology resulted as polymorphous population of lymphocytes. No carcinoma identified in sampled specimen.   - RUQ U/S 06/02/23: Prior cholecystectomy with dilated CBD. Mild diffuse parenchymal hepatic disease, mild splenomegaly  - Repeat RUQ [place] 06/06/23: Prior cholecystectomy. Bile duct dilation has resolved. Mild hepatosplenomegaly.   - Fecal calprotectin elevated at 492.  - Hepatology has been following peripherally. After discussion with hepatology team on 12/13, plan for liver biopsy in IR on 12/16  - Continue cholestyramine, increased to 8g BID 12/11.   - Started ursodeoxycholic acid 600mg  BID (initiated 500mg  as 600mg  was not an option inpatient).  - CMP qday  - Holding PTA crestor 40mg  daily Leukocytosis  Eosinophilia  - Patient has been noted to have leukocytosis and eosinophilia since 2022. He follows with Hematology as an outpatient to address this. He last saw them 03/2023 with plans to repeat CBC and return to clinic in 3 months.   - He denies infectious symptoms at this time.   - CXR negative for acute process. UA not c/w UTI  - Dermatology consulted for rash, eosinophilia, elevated AST/ALT. Performed right upper arm punch biopsy on 06/07/23; pathology demonstrated epidermal necrosis suggestive of excoriation. Will need suture removed on 06/20/23. Initiated triamcinolone BID, defer anti-histamines at this time.      Trivial pericardial effusion  - Noted incidentally on CT A/P at OSH  - No chest pain, has diffuse pain due to fibromyalgia history  - TTE 06/02/23 w/ normal EF, normal CVP, no significant valvular abnormalities, trivial pericardial effusion     Chronic pancreatitis  - Lipase 12/8 <3.  - Continue PTA pancreatic enzymes supplements, morphine IR 15mg  q6h PRN     URI symptoms for one week prior to admission/Sinusitis  - S/p 5 day course of augmentin, CXR negative x2.   - Continue supportive care.      Crohn's disease history  - Follows with GI. Denies current black/bloody bowel movements  - PTA Stelera every 4 weeks    T2DM  - Last A1c 9.1 (06/05/2021); repeat 9.8% 06/03/23  - PTA tresiba 30 units BID, humalog 4 units TIDAC + SSI, farxiga   -  Currently on glargine 25 units BID, aspart 6 units TIDAC, LDCF     Hypothyroidism  - TSH wnl  - Continue PTA levothyroxine 25 mcg daily     Anxiety disorder  - Continue PTA xanax 1mg  q6h PRN, nortriptyline 50mg  qHS    Isotonic hyponatremia  - Mild, worsened 12/11 due to hyperglycemia (pseudo hyponatremia).  - Triglycerides mildly elevated, but probably not enough to lower sodium to this degree  - Urine sodium 121, urine osm 910, serum osm 290  - AM cortisol wnl  - SPEP showed hypogammaglobulinemia   - CTM    FEN: No IVF, replace lytes PRN, diabetic diet - NPO at midnight for IR procedure  DVT ppx: lovenox held for liver biopsy  Code status: Full code    Dispo: Continue inpatient care    Complexity of medical decision making is high because of the acute exacerbation/progression of 1 or more chronic illness process/illness that poses threat to life or bodily function. Management is complex due to the need to review notes outside of my specialty, independent interpretation and review of each individual laboratory studies, initiation and continuation of drug therapy requiring intensive monitoring for toxicity, need to weigh the risks and benefits of recommended procedures, daily assessment of need for continued admission versus escalation of care, communicating all new test results and counseling the patient in regards to further work-up and treatment.   ________________________________________________________________________    Subjective    No acute events overnight. Patient reported feeling ok this morning. Awaiting liver biopsy, scheduled for this afternoon.     Medications  Scheduled Meds:cholestyramine-aspartame (QUESTRAN LIGHT) 4 gram packet 8 g, 8 g, Oral, BID(10-22)  [Held by Provider] enoxaparin (LOVENOX) syringe 40 mg, 40 mg, Subcutaneous, QDAY(21)  gabapentin (NEURONTIN) capsule 300 mg, 300 mg, Oral, TID  insulin aspart (U-100) (NOVOLOG FLEXPEN U-100 INSULIN) injection PEN 0-12 Units, 0-12 Units, Subcutaneous, ACHS (22)  insulin aspart (U-100) (NOVOLOG FLEXPEN U-100 INSULIN) injection PEN 6 Units, 6 Units, Subcutaneous, TID w/ meals  insulin glargine (LANTUS SOLOSTAR U-100 INSULIN) injection PEN 25 Units, 25 Units, Subcutaneous, BID  levothyroxine (SYNTHROID) tablet 25 mcg, 25 mcg, Oral, QDAY 30 min before breakfast  lipase-protease-amylase (ZENPEP 40) capsule 1 capsule, 1 capsule, Oral, w/Snacks  lipase-protease-amylase (ZENPEP 40) capsule 2 capsule, 2 capsule, Oral, TID w/ meals  nortriptyline (PAMELOR) capsule 50 mg, 50 mg, Oral, QHS  pantoprazole DR (PROTONIX) tablet 40 mg, 40 mg, Oral, BID  polyethylene glycol 3350 (MIRALAX) packet 17 g, 1 packet, Oral, QDAY  sennosides-docusate sodium (SENOKOT-S) tablet 1 tablet, 1 tablet, Oral, QDAY  triamcinolone acetonide (KENALOG) 0.1 % topical ointment, , Topical, BID  ursodioL (URSO 250) tablet 500 mg, 500 mg, Oral, BID w/meals    Continuous Infusions:  PRN and Respiratory Meds:ALPRAZolam Q6H PRN, calcium carbonate Q4H PRN, camphor-menthol TID PRN, dextrose 50% (D50) IV PRN, fluticasone propionate BID PRN, melatonin QHS PRN, morphine Q6H PRN, ondansetron Q6H PRN **OR** ondansetron (ZOFRAN) IV Q6H PRN, traZODone QHS PRN      Review of Systems:  All other systems reviewed and are negative.    Objective:                          Vital Signs: Last Filed                 Vital Signs: 24 Hour Range   BP: 97/62 (12/16 0805)  Temp: 36.7 ?C (98 ?F) (12/16 0805)  Pulse: 100 (12/16 0805)  Respirations:  18 PER MINUTE (12/16 0805)  SpO2: 98 % (12/16 0805)  O2 Device: None (Room air) (12/16 0805) BP: (91-110)/(62-72)   Temp:  [36.3 ?C (97.3 ?F)-37.1 ?C (98.8 ?F)]   Pulse:  [75-100]   Respirations:  [16 PER MINUTE-18 PER MINUTE]   SpO2:  [98 %-99 %]   O2 Device: None (Room air)     Vitals:    06/02/23 0100 06/02/23 1113 06/09/23 1515   Weight: 93 kg (205 lb 0.4 oz) 93 kg (205 lb 0.4 oz) 89.8 kg (197 lb 15.6 oz)       Intake/Output Summary:  (Last 24 hours)    Intake/Output Summary (Last 24 hours) at 06/14/2023 4098  Last data filed at 06/14/2023 0425  Gross per 24 hour   Intake 300 ml   Output --   Net 300 ml           Physical Exam  General appearance: Alert, cooperative and no distress  Head: NC/AT  Neck: No JVD  Lungs: Breathing non labored   Abdomen: Non distended  Extremities: No edema BLE  Neurologic: No focal deficit  Skin: Jaundiced. Suture in place at site of right arm punch biopsy    Lab Review  24-hour labs:    Results for orders placed or performed during the hospital encounter of 06/02/23 (from the past 24 hours)   POC GLUCOSE    Collection Time: 06/13/23 11:23 AM   Result Value Ref Range    Glucose, POC 283 (H) 70 - 100 mg/dL   POC GLUCOSE    Collection Time: 06/13/23  5:30 PM   Result Value Ref Range    Glucose, POC 279 (H) 70 - 100 mg/dL   POC GLUCOSE    Collection Time: 06/13/23  9:04 PM   Result Value Ref Range    Glucose, POC 190 (H) 70 - 100 mg/dL   PROTIME INR (PT)    Collection Time: 06/14/23  6:21 AM   Result Value Ref Range    Protime 11.4 10.2 - 12.9 Seconds    INR 1.0 0.9 - 1.2   COMPREHENSIVE METABOLIC PANEL    Collection Time: 06/14/23  6:22 AM   Result Value Ref Range    Sodium 133 (L) 137 - 147 mmol/L    Potassium 4.3 3.5 - 5.1 mmol/L    Chloride 100 98 - 110 mmol/L    Glucose 153 (H) 70 - 100 mg/dL    Blood Urea Nitrogen 10 7 - 25 mg/dL    Creatinine 1.19 1.47 - 1.24 mg/dL    Calcium 9.1 8.5 - 82.9 mg/dL    Total Protein 6.6 6.0 - 8.0 g/dL    Total Bilirubin 7.6 (H) 0.2 - 1.3 mg/dL    Albumin 3.5 3.5 - 5.0 g/dL    Alk Phosphatase 562 (H) 25 - 110 U/L    AST 66 (H) 7 - 40 U/L    ALT 94 (H) 7 - 56 U/L    CO2 25 21 - 30 mmol/L    Anion Gap 8 3 - 12    Glomerular Filtration Rate (GFR) >60 >60 mL/min   CBC AND DIFF    Collection Time: 06/14/23  6:22 AM   Result Value Ref Range    White Blood Cells 14.70 (H) 4.50 - 11.00 10*3/uL    Red Blood Cells 4.62 4.40 - 5.50 10*6/uL    Hemoglobin 13.7 13.5 - 16.5 g/dL    Hematocrit 13.0 86.5 - 50.0 %    MCV 88.9  80.0 - 100.0 fL    MCH 29.5 26.0 - 34.0 pg    MCHC 33.3 32.0 - 36.0 g/dL    RDW 95.6 (H) 21.3 - 15.0 %    Platelet Count 460 (H) 150 - 400 10*3/uL    MPV 8.3 7.0 - 11.0 fL   POC GLUCOSE    Collection Time: 06/14/23  8:05 AM   Result Value Ref Range    Glucose, POC 173 (H) 70 - 100 mg/dL       Point of Care Testing  (Last 24 hours)  Glucose: (!) 153 (06/14/23 0622)  POC Glucose (Download): (!) 173 (06/14/23 0805)    Radiology and other Diagnostics Review:    Pertinent radiology reviewed.    Baird [facility], DO   Pager

## 2023-06-14 NOTE — Procedures
[facility] END OF SHIFT/ JHFRAT NOTE    Admission Date: 06/02/2023  Length of Stay: LOS: 12 days    Acute events, interventions, provider communication: n/a     Patient Interventions and Education  Fall Risk/JHFRAT Interventions and Education: (Charting when applicable)   Total Fall Risk Score: Total Fall Risk Score: 5.   Elimination Interventions : N/A  Medications : N/A  Patient Care Equipment: N/A  Mobility: N/A  Cognition: N/A  Risk for Moderate/Major Injury: N/A  2. BMAT: Bedside Mobility Assessment Tool (BMAT)  Sit and Shake: Pass  Extend and Point: Pass  Stand: Pass  Step: Pass (Green: walk without assistive device)    Restraints:  No       See Docflowsheet for restraint documentation, interventions, education, etc.    Activity:  Mobility: good    If other, explain:     Hygiene:     Bath/Shower: Soap and water shower/bath     Oral Care: Moisturize, Brush     Urinary Catheter / Perineal Care: Self    Intake and Output:      Oral Supplement: Boost breeze    Last Bowel Movement Date: 06/13/23,           Date 06/13/23 0701 - 06/14/23 0700 06/14/23 0701 - 06/15/23 0700   Shift 0701-1900 1901-0700 24 Hour Total 0701-1900 1901-0700 24 Hour Total   INTAKE   P.O.  300 300      Shift Total(mL/kg)  300(3.3) 300(3.3)      OUTPUT   Other           Stool Occurrence 1 x 0 x 1 x        Urine Occurrence  5 x 1 x 6 x      Shift Total(mL/kg)         NET  300 300      Weight (kg) 89.8 89.8 89.8 89.8 89.8 89.8

## 2023-06-14 NOTE — Progress Notes
Anesthesia staff present to monitor patient airway, vital signs, and medications. See anesthesia docflow. This RN will assist as needed.

## 2023-06-14 NOTE — Unmapped
Immediate Post Procedure Note    Date:  06/14/2023                                         Attending Physician:   Dr. Gilmer Mor  Performing Provider:  Ardyth Man, MD    Consent:  Consent obtained from patient.  Time out performed: Consent obtained, correct patient verified, correct procedure verified, correct site verified, patient marked as necessary.  Pre/Post Procedure Diagnosis:  Elevated LFTs  Indications:  Diagnostic evaluation      Procedure(s):  US guided parenchymal liver biopsy  Findings:  Three 18g core samples obtained     Estimated Blood Loss:  None/Negligible  Specimen(s) Removed/Disposition:  Yes, sent to pathology  Complications: None  Patient Tolerated Procedure: Well  Post-Procedure Condition:  stable    Ardyth Man, MD

## 2023-06-14 NOTE — Progress Notes
[facility] END OF SHIFT/ JHFRAT NOTE    Admission Date: 06/02/2023  Length of Stay: LOS: 12 days    Acute events, interventions, provider communication:  pt went to IR today to obtain liver biopsy. No acute events. VSS. No new concerns at this time. Care ongoing.     Patient Interventions and Education  Fall Risk/JHFRAT Interventions and Education: (Charting when applicable)   Total Fall Risk Score: Total Fall Risk Score: 8.   Elimination Interventions : N/A  Medications : Educate patient on medication side effects and Bed/chair alarm (i.e., change in mental status)   Patient Care Equipment: Does not need assistance with patient care equipment when ambulating and Ensure environment is free of clutter and walkways are clear from tripping hazards  Mobility: N/A  Cognition: N/A  Risk for Moderate/Major Injury: Surgery/procedure requiring anesthesia within past 24 hours  2. BMAT: Bedside Mobility Assessment Tool (BMAT)  Sit and Shake: Pass  Extend and Point: Pass  Stand: Pass  Step: Pass (Green: walk without assistive device)    Restraints:  No       See Docflowsheet for restraint documentation, interventions, education, etc.    Activity:  Mobility: fair    If other, explain:     Hygiene:     Bath/Shower: Soap and water shower/bath     Oral Care: Moisturize, Brush     Urinary Catheter / Perineal Care: Self    Intake and Output:      Oral Supplement: Boost breeze    Last Bowel Movement Date: 06/06/23,           Date 06/13/23 0701 - 06/14/23 0700 06/14/23 0701 - 06/15/23 0700   Shift 0701-1900 1901-0700 24 Hour Total 0701-1900 1901-0700 24 Hour Total   INTAKE   P.O.  300 300      Shift Total(mL/kg)  300(3.3) 300(3.3)      OUTPUT   Other           Stool Occurrence 1 x 0 x 1 x 0 x  0 x     Urine Occurrence  5 x 1 x 6 x 0 x  0 x   Shift Total(mL/kg)         NET  300 300      Weight (kg) 89.8 89.8 89.8 89.8 89.8 89.8

## 2023-06-14 NOTE — Anesthesia Post-Procedure Evaluation
Post-Anesthesia Evaluation    Name: Alan [name]      MRN: [id number]     DOB: 22-Aug-1980     Age: 42 y.o.     Sex: male   __________________________________________________________________________     Procedure Information       Anesthesia Start Date/Time: 06/14/23 1511    Scheduled providers: Gabriel Carina, MD; Michele Mcalpine, RN; Raleigh Nation, RT(R)(VI),LRT; Andi Hence, MD    Procedure: IR PERCUTANEOUS LIVER PARENCHYMA BIOPSY    Location: Interventional Radiology: [facility]            Post-Anesthesia Vitals  BP: 111/74 (12/16 1657)  Temp: 36.5 ?C (97.7 ?F) (12/16 1657)  Pulse: 79 (12/16 1657)  Respirations: 16 PER MINUTE (12/16 1657)  SpO2: 97 % (12/16 1657)  O2 Device: None (Room air) (12/16 1657)   Vitals Value Taken Time   BP 113/86 06/14/23 1645   Temp 36.3 ?C (97.4 ?F) 06/14/23 1538   Pulse 95 06/14/23 1645   Respirations 21 PER MINUTE 06/14/23 1645   SpO2 97 % 06/14/23 1645   O2 Device     ABP     ART BP           Post Anesthesia Evaluation Note    Evaluation location: Pre/Post  Patient participation: recovered; patient participated in evaluation  Level of consciousness: alert    Pain score: 3  Pain management: adequate    Hydration: normovolemia  Temperature: 36.0?C - 38.4?C  Airway patency: adequate    Perioperative Events       Post-op nausea and vomiting: no PONV    Postoperative Status  Cardiovascular status: hemodynamically stable  Respiratory status: spontaneous ventilation  Follow-up needed: none  Additional comments: The patient is tolerating PO intake and does not currently have any complaints at the time of discharge evaluation.         Perioperative Events  There were no known complications for this encounter.

## 2023-06-15 ENCOUNTER — Encounter: Admit: 2023-06-15 | Discharge: 2023-06-15 | Payer: Private Health Insurance - Indemnity

## 2023-06-15 ENCOUNTER — Inpatient Hospital Stay: Admit: 2023-06-15 | Discharge: 2023-06-16 | Payer: Private Health Insurance - Indemnity

## 2023-06-15 LAB — MANUAL DIFF
~~LOC~~ BKR BASOPHILS - RELATIVE: 1 % (ref 0–2)
~~LOC~~ BKR EOSINOPHILS - RELATIVE: 7 % — ABNORMAL HIGH (ref 0–5)
~~LOC~~ BKR LYMPHOCYTES - RELATIVE: 23 % — ABNORMAL LOW (ref 24–44)
~~LOC~~ BKR MONOCYTES - RELATIVE: 4 % (ref 4–12)
~~LOC~~ BKR NEUT+BANDS - ABSOLUTE: 8 10*3/uL — ABNORMAL HIGH (ref 1.8–7.0)
~~LOC~~ BKR NEUTROPHILS - RELATIVE: 65 % (ref 41–77)

## 2023-06-15 LAB — POC GLUCOSE
~~LOC~~ BKR POC GLUCOSE: 108 mg/dL — ABNORMAL HIGH (ref 70–100)
~~LOC~~ BKR POC GLUCOSE: 154 mg/dL — ABNORMAL HIGH (ref 70–100)
~~LOC~~ BKR POC GLUCOSE: 188 mg/dL — ABNORMAL HIGH (ref 70–100)
~~LOC~~ BKR POC GLUCOSE: 237 mg/dL — ABNORMAL HIGH (ref 70–100)

## 2023-06-15 MED ORDER — HYDROMORPHONE (PF) 2 MG/ML IJ SYRG
.5 mg | INTRAVENOUS | 0 refills | Status: DC | PRN
Start: 2023-06-15 — End: 2023-06-16
  Administered 2023-06-15 – 2023-06-16 (×3): 0.5 mg via INTRAVENOUS

## 2023-06-15 MED ORDER — HYDROMORPHONE (PF) 2 MG/ML IJ SYRG
.5 mg | Freq: Once | INTRAVENOUS | 0 refills | Status: CP
Start: 2023-06-15 — End: ?
  Administered 2023-06-15: 10:00:00 0.5 mg via INTRAVENOUS

## 2023-06-15 NOTE — Case Management (ED)
Case Management Progress Note    NAME:Alan [name]                          MRN: [id number]              DOB:05-14-1981          AGE: 42 y.o.  ADMISSION DATE: 06/02/2023             DAYS ADMITTED: LOS: 13 days      Today's Date: 06/15/2023    PLAN: DC home with no identified NCM needs once medically stable    Expected Discharge Date: 06/16/2023   Is Patient Medically Stable: No, pain control  Are there Barriers to Discharge? No    INTERVENTION/DISPOSITION:  Discharge Planning              Discharge Planning: No Needs Identified   Reviewed EMR, attended huddle   Per huddle, still managing pain following biopsy yesterday   Transportation                 Support              Support: Huddle/team update, No needs identified  Info or Referral              Information or Referral to Community Resources: No Needs Identified  Positive SDOH Domains and Potential Barriers      Medication Needs              Medication Needs: No Needs Identified  Financial              Financial: No Needs Identified  Legal              Legal: No Needs Identified  Other              Other/None: No needs identified  Discharge Disposition                           Selected Continued Care - Admitted Since 06/02/2023    No services have been selected for the patient.       Maren Beach, BSN, RN, [facility]  Integrated Nurse Case Manager  Available on [employer] [phone]

## 2023-06-15 NOTE — Consults
CLINICAL NUTRITION                                                        Clinical Nutrition Initial Assessment    Name: Alan [name]   MRN: [id number]     DOB: 06-01-1981      Age: 42 y.o.  Admission Date: 06/02/2023     LOS: 13 days     Date of Service: 06/15/2023        Recommendation:  Encourage 3-4 meals per day;   boost glucose control PRN -pt will request    Comments:  with a history of Crohns disease, DMII, hypothyroidism, fibromyalgia, anxiety who was transferred from OSH for further workup/management of elevated LFTs.  spoke with pt who reports has been eating typically 2 meals per day, will take a boost if meal intake is poor. pt reports eating sometimes at downstairs cafateria and getting bagged lunch. boost glucose control ordered PRN. Pt on bowel meds. Noted: Sodium 131, glu 206 Pt with 8 lb wt loss since admit. pt denies nutrition questions at this time.    Nutrition Assessment of Patient:  Admit Weight: 93 kg;  ; Desired Weight: 76.5 kg  BMI (Calculated): 30.26;      Pertinent Allergies/Intolerances: no known food allergies     Oral Diet Order: Diabetic 1600-2000 Kcal/day (60 g carb/meal, 30 g carb/HS snack); Oral Supplement: Boost glucose control (PRN)     Current Energy Intake: Inconsistent           Weight Used for Calculation: 76.5 kg  Estimated Calorie Needs: 6644-0347 kcals (25-28 kcals/kg DBW)  Estimated Protein Needs: 77-99g (1-1.3 g/kg DBW)    Malnutrition Assessment:   Does not meet criteria                  Nutrition Focused Physical Exam:  Loss of Subcutaneous Fat: No;  ;    Muscle Wasting: No;  ;    Physical Assessment:  RLE Edema: Non-pitting  LLE Edema: Non-pitting     Pressure Injury: no     Comment: +BM 12/15; constipation    Nutrition Diagnosis:  Predicted suboptimal nutrient intake  Etiology: clinical status  Signs & Symptoms: pt reports decreased appetite/intake           Intervention / Plan:  RD to follow and monitor PO intake, wt trends, I/Os, GI function, and overall nutrition status and make recs as indicated        Mady Gemma, RD, LD   Available on [employer]

## 2023-06-15 NOTE — Progress Notes
[facility] END OF SHIFT/ JHFRAT NOTE    Admission Date: 06/02/2023  Length of Stay: LOS: 13 days    Acute events, interventions, provider communication: provider notified of patients uncontrolled pain.     Patient Interventions and Education  Fall Risk/JHFRAT Interventions and Education: (Charting when applicable)   Total Fall Risk Score: Total Fall Risk Score: 8.   Elimination Interventions : N/A  Medications : Stay within arm's reach during toileting/showering (i.e., dizziness, orthostasis) , Educate patient on medication side effects, and Bed/chair alarm (i.e., change in mental status)   Patient Care Equipment: Does not need assistance with patient care equipment when ambulating and Ensure environment is free of clutter and walkways are clear from tripping hazards  Mobility: N/A  Cognition: N/A  Risk for Moderate/Major Injury: Surgery/procedure requiring anesthesia within past 24 hours  2. BMAT: Bedside Mobility Assessment Tool (BMAT)  Sit and Shake: Pass  Extend and Point: Pass  Stand: Pass  Step: Pass (Green: walk without assistive device)    Restraints:  No       See Docflowsheet for restraint documentation, interventions, education, etc.    Activity:  Mobility: good    If other, explain:     Hygiene:     Bath/Shower: Soap and water shower/bath     Oral Care: Moisturize, Brush     Urinary Catheter / Perineal Care: Self    Intake and Output:      Oral Supplement: Boost breeze    Last Bowel Movement Date: 06/13/23,           Date 06/14/23 0701 - 06/15/23 0700 06/15/23 0701 - 06/16/23 0700   Shift 0701-1900 1901-0700 24 Hour Total 0701-1900 1901-0700 24 Hour Total   INTAKE   P.O.  900 900      Shift Total(mL/kg)  900(10) 900(10)      OUTPUT   Other           Stool Occurrence 0 x 0 x 0 x        Urine Occurrence  0 x 2 x 2 x      Shift Total(mL/kg)         NET  900 900      Weight (kg) 89.8 89.8 89.8 89.8 89.8 89.8

## 2023-06-15 NOTE — Progress Notes
Internal Medicine Progress Note    Name:  Alan [name]   IONGE'X Date:  06/15/2023  Admission Date: 06/02/2023  LOS: 13 days                     Assessment/Plan:    Principal Problem:    Elevated LFTs  Active Problems:    Difficult airway    Alan [name] is a 42 y.o. M with a history of Crohns disease, DMII, hypothyroidism, fibromyalgia, anxiety who was transferred from OSH for further workup/management of elevated LFTs.     Elevated AST, ALT- improving  Elevated T bili, alk phos- increasing  Pruritus likely 2/2 hyperbilirubinemia  - AST 132, ALT 320, ALP 325, TB of 5.1 at OSH  - Hepatitis panel negative  - CT abdomen at OSH shows mild esophagitis, small pericardial effusion, mild atrophic pancreas with faint calcifications due to chronic pancreatitis   - Ddx: DILI d/t Augmentin vs biliary pathology (I.e. PSC) 2/2 Crohn's Disease.  - Direct bilirubin significantly elevated. Lipase <3.    - EBV panel w/ positive IgG and negative IgM. CMV negative. HSV not detected. Iron panel wnl. Acute hepatitis panel negative. A1AT, ANA, ASMA, total immunoglobulins were wnl. HIV negative.  - AMA was positive  - ERCP 06/02/23 without significant abnormalities. FNA of porta hepatis lymph node obtained. Pathology resulted as polymorphous population of lymphocytes. No carcinoma identified in sampled specimen.   - RUQ U/S 06/02/23: Prior cholecystectomy with dilated CBD. Mild diffuse parenchymal hepatic disease, mild splenomegaly  - Repeat RUQ [place] 06/06/23: Prior cholecystectomy. Bile duct dilation has resolved. Mild hepatosplenomegaly.   - Fecal calprotectin elevated at 492.  - Hepatology has been following peripherally. After discussion with hepatology team on 12/13, liver biopsy obtained in IR on 12/16, pathology pending  - Continue cholestyramine, increased to 8g BID 12/11.   - Started ursodeoxycholic acid 600mg  BID (initiated 500mg  as 600mg  was not an option inpatient).  - CMP qday  - Holding PTA crestor 40mg  daily  - Patient reporting uncontrolled pain following liver biopsy despite taking PO morphine. Added PRN IV dilaudid for breakthrough pain     Leukocytosis  Eosinophilia  - Patient has been noted to have leukocytosis and eosinophilia since 2022. He follows with Hematology as an outpatient to address this. He last saw them 03/2023 with plans to repeat CBC and return to clinic in 3 months.   - He denies infectious symptoms at this time.   - CXR negative for acute process. UA not c/w UTI  - Dermatology consulted for rash, eosinophilia, elevated AST/ALT. Performed right upper arm punch biopsy on 06/07/23; pathology demonstrated epidermal necrosis suggestive of excoriation. Will need suture removed on 06/20/23. Initiated triamcinolone BID, defer anti-histamines at this time.      Trivial pericardial effusion  - Noted incidentally on CT A/P at OSH  - No chest pain, has diffuse pain due to fibromyalgia history  - TTE 06/02/23 w/ normal EF, normal CVP, no significant valvular abnormalities, trivial pericardial effusion     Chronic pancreatitis  - Lipase 12/8 <3.  - Continue PTA pancreatic enzymes supplements, morphine IR 15mg  q6h PRN     URI symptoms for one week prior to admission/Sinusitis  - S/p 5 day course of augmentin, CXR negative x2.   - Continue supportive care.      Crohn's disease history  - Follows with GI. Denies current black/bloody bowel movements  - PTA Stelera every 4 weeks    T2DM  -  Last A1c 9.1 (06/05/2021); repeat 9.8% 06/03/23  - PTA tresiba 30 units BID, humalog 4 units TIDAC + SSI, farxiga   - Currently on glargine 25 units BID, aspart 6 units TIDAC, LDCF     Hypothyroidism  - TSH wnl  - Continue PTA levothyroxine 25 mcg daily     Anxiety disorder  - Continue PTA xanax 1mg  q6h PRN, nortriptyline 50mg  qHS    Isotonic hyponatremia  - Mild, worsened 12/11 due to hyperglycemia (pseudo hyponatremia).  - Triglycerides mildly elevated, but probably not enough to lower sodium to this degree  - Urine sodium 121, urine osm 910, serum osm 290  - AM cortisol wnl  - SPEP showed hypogammaglobulinemia   - CTM    FEN: No IVF, replace lytes PRN, diabetic diet   DVT ppx: lovenox   Code status: Full code    Dispo: Continue inpatient care    Complexity of medical decision making is high because of the acute exacerbation/progression of 1 or more chronic illness process/illness that poses threat to life or bodily function. Management is complex due to the need to review notes outside of my specialty, independent interpretation and review of each individual laboratory studies, initiation and continuation of drug therapy requiring intensive monitoring for toxicity, need to weigh the risks and benefits of recommended procedures, daily assessment of need for continued admission versus escalation of care, communicating all new test results and counseling the patient in regards to further work-up and treatment.   ________________________________________________________________________    Subjective    No acute events overnight. Patient reports ongoing pain after the liver biopsy however this is better compared to last night. He felt like his pain was not managed with the PO morphine, did have good response with IV dilaudid. Still does not have much of an appetite. Had trouble sleeping overnight due to pain and itching.     Medications  Scheduled Meds:cholestyramine-aspartame (QUESTRAN LIGHT) 4 gram packet 8 g, 8 g, Oral, BID(10-22)  [Held by Provider] enoxaparin (LOVENOX) syringe 40 mg, 40 mg, Subcutaneous, QDAY(21)  gabapentin (NEURONTIN) capsule 300 mg, 300 mg, Oral, TID  insulin aspart (U-100) (NOVOLOG FLEXPEN U-100 INSULIN) injection PEN 0-12 Units, 0-12 Units, Subcutaneous, ACHS (22)  insulin aspart (U-100) (NOVOLOG FLEXPEN U-100 INSULIN) injection PEN 6 Units, 6 Units, Subcutaneous, TID w/ meals  insulin glargine (LANTUS SOLOSTAR U-100 INSULIN) injection PEN 25 Units, 25 Units, Subcutaneous, BID  levothyroxine (SYNTHROID) tablet 25 mcg, 25 mcg, Oral, QDAY 30 min before breakfast  lipase-protease-amylase (ZENPEP 40) capsule 1 capsule, 1 capsule, Oral, w/Snacks  lipase-protease-amylase (ZENPEP 40) capsule 2 capsule, 2 capsule, Oral, TID w/ meals  nortriptyline (PAMELOR) capsule 50 mg, 50 mg, Oral, QHS  pantoprazole DR (PROTONIX) tablet 40 mg, 40 mg, Oral, BID  polyethylene glycol 3350 (MIRALAX) packet 17 g, 1 packet, Oral, QDAY  sennosides-docusate sodium (SENOKOT-S) tablet 1 tablet, 1 tablet, Oral, QDAY  triamcinolone acetonide (KENALOG) 0.1 % topical ointment, , Topical, BID  ursodioL (URSO 250) tablet 500 mg, 500 mg, Oral, BID w/meals    Continuous Infusions:  PRN and Respiratory Meds:ALPRAZolam Q6H PRN, calcium carbonate Q4H PRN, camphor-menthol TID PRN, dextrose 50% (D50) IV PRN, fluticasone propionate BID PRN, melatonin QHS PRN, morphine Q6H PRN, ondansetron Q6H PRN **OR** ondansetron (ZOFRAN) IV Q6H PRN, traZODone QHS PRN      Review of Systems:  All other systems reviewed and are negative.    Objective:  Vital Signs: Last Filed                 Vital Signs: 24 Hour Range   BP: 130/78 (12/17 0814)  Temp: 36.3 ?C (97.4 ?F) (12/17 9811)  Pulse: 92 (12/17 0814)  Respirations: 16 PER MINUTE (12/17 0814)  SpO2: 98 % (12/17 0814)  O2 Device: None (Room air) (12/17 0814) BP: (96-130)/(66-86)   Temp:  [36.3 ?C (97.4 ?F)-36.8 ?C (98.2 ?F)]   Pulse:  [76-95]   Respirations:  [12 PER MINUTE-21 PER MINUTE]   SpO2:  [94 %-100 %]   O2 Device: None (Room air)   Intensity Pain Scale (Self Report): 7 (06/14/23 2109) Vitals:    06/02/23 0100 06/02/23 1113 06/09/23 1515   Weight: 93 kg (205 lb 0.4 oz) 93 kg (205 lb 0.4 oz) 89.8 kg (197 lb 15.6 oz)       Intake/Output Summary:  (Last 24 hours)    Intake/Output Summary (Last 24 hours) at 06/15/2023 9147  Last data filed at 06/15/2023 0426  Gross per 24 hour   Intake 900 ml   Output --   Net 900 ml           Physical Exam  General appearance: Alert, cooperative and no distress  Head: NC/AT  Neck: No JVD  Lungs: Breathing non labored   Abdomen: Non distended  Extremities: No edema BLE  Neurologic: No focal deficit  Skin: Jaundiced. Suture in place at site of right arm punch biopsy    Lab Review  24-hour labs:    Results for orders placed or performed during the hospital encounter of 06/02/23 (from the past 24 hours)   POC GLUCOSE    Collection Time: 06/14/23 11:30 AM   Result Value Ref Range    Glucose, POC 177 (H) 70 - 100 mg/dL   POC GLUCOSE    Collection Time: 06/14/23  4:58 PM   Result Value Ref Range    Glucose, POC 178 (H) 70 - 100 mg/dL   POC GLUCOSE    Collection Time: 06/14/23  9:05 PM   Result Value Ref Range    Glucose, POC 108 (H) 70 - 100 mg/dL   COMPREHENSIVE METABOLIC PANEL    Collection Time: 06/15/23  3:55 AM   Result Value Ref Range    Sodium 131 (L) 137 - 147 mmol/L    Potassium 3.5 3.5 - 5.1 mmol/L    Chloride 97 (L) 98 - 110 mmol/L    Glucose 206 (H) 70 - 100 mg/dL    Blood Urea Nitrogen 10 7 - 25 mg/dL    Creatinine 8.29 5.62 - 1.24 mg/dL    Calcium 9.4 8.5 - 13.0 mg/dL    Total Protein 6.8 6.0 - 8.0 g/dL    Total Bilirubin 7.8 (H) 0.2 - 1.3 mg/dL    Albumin 3.5 3.5 - 5.0 g/dL    Alk Phosphatase 865 (H) 25 - 110 U/L    AST 99 (H) 7 - 40 U/L    ALT 145 (H) 7 - 56 U/L    CO2 24 21 - 30 mmol/L    Anion Gap 10 3 - 12    Glomerular Filtration Rate (GFR) >60 >60 mL/min   CBC AND DIFF    Collection Time: 06/15/23  3:55 AM   Result Value Ref Range    White Blood Cells 12.30 (H) 4.50 - 11.00 10*3/uL    Red Blood Cells 4.62 4.40 - 5.50 10*6/uL    Hemoglobin 13.7 13.5 -  16.5 g/dL    Hematocrit 42.5 95.6 - 50.0 %    MCV 87.9 80.0 - 100.0 fL    MCH 29.8 26.0 - 34.0 pg    MCHC 33.9 32.0 - 36.0 g/dL    RDW 38.7 (H) 56.4 - 15.0 %    Platelet Count 473 (H) 150 - 400 10*3/uL    MPV 8.0 7.0 - 11.0 fL   MANUAL DIFF    Collection Time: 06/15/23  3:55 AM   Result Value Ref Range    ANISO Present     Target Present     Platelet Estimate Slight Increase     Segmented Neutrophils 65 41 - 77 % Lymphocytes 23 (L) 24 - 44 %    Monocytes 4 4 - 12 %    Eosinophil 7 (H) 0 - 5 %    Basophil 1 0 - 2 %    Absolute Neutrophil Count Manual 8.0 (H) 1.8 - 7.0 10*3/uL   POC GLUCOSE    Collection Time: 06/15/23  7:58 AM   Result Value Ref Range    Glucose, POC 237 (H) 70 - 100 mg/dL       Point of Care Testing  (Last 24 hours)  Glucose: (!) 206 (06/15/23 0355)  POC Glucose (Download): (!) 237 (06/15/23 3329)    Radiology and other Diagnostics Review:    Pertinent radiology reviewed.    [facility], DO   Pager

## 2023-06-15 NOTE — Progress Notes
Pharmacy Benefits Investigation    Medication name: ustekinumab (STELARA) 90 mg syringe  Medication status: continuation (refill)  Medication regimen: Inject 1mL under the skin every 8 weeks    The insurance requires a prior authorization for the medication. The prior authorization was submitted via CoverMyMeds.    PA number: [id number]    Maree Erie  Specialty Pharmacy Patient Advocate

## 2023-06-16 ENCOUNTER — Encounter: Admit: 2023-06-16 | Discharge: 2023-06-16 | Payer: Private Health Insurance - Indemnity

## 2023-06-16 LAB — POC GLUCOSE
~~LOC~~ BKR POC GLUCOSE: 287 mg/dL — ABNORMAL HIGH (ref 70–100)
~~LOC~~ BKR POC GLUCOSE: 102 mg/dL — ABNORMAL HIGH (ref 70–100)
~~LOC~~ BKR POC GLUCOSE: 161 mg/dL — ABNORMAL HIGH (ref 70–100)
~~LOC~~ BKR POC GLUCOSE: 189 mg/dL — ABNORMAL HIGH (ref 70–100)

## 2023-06-16 LAB — SURGICAL PATHOLOGY

## 2023-06-16 NOTE — Care Plan
Brief hepatology note:    Liver biopsy came back  A. 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 See comment.               Mild steatosis.    Overall, most likely from Augmentin versus very early PBC.    Discussed this and biopsy results with patient.   Overall recommendations do not change much.  If in the outpatient setting, liver enzymes decrease and normalize, is most likely from the Augmentin.  If they remain elevated, patient not improved, most likely PBC.  Fecal calprotectin elevated from previous, 492 from 324, has follow-up with general GI for Crohn's scheduled.    Recommendations:  -Continue follow-up with hepatology clinic that is scheduled 06/21/2023  -Will have outpatient labs  -Continue ursodiol and cholestyramine  -Follow-up hepatitis E IgG and IgM, continue supportive care

## 2023-06-16 NOTE — Progress Notes
[facility] END OF SHIFT/ JHFRAT NOTE    Admission Date: 06/02/2023  Length of Stay: LOS: 14 days    Acute events, interventions, provider communication: n/a     Patient Interventions and Education  Fall Risk/JHFRAT Interventions and Education: (Charting when applicable)   Total Fall Risk Score: Total Fall Risk Score: 5.   Elimination Interventions : N/A  Medications : Stay within arm's reach during toileting/showering (i.e., dizziness, orthostasis) , Educate patient on medication side effects, and Bed/chair alarm (i.e., change in mental status)   Patient Care Equipment: Does not need assistance with patient care equipment when ambulating and Ensure environment is free of clutter and walkways are clear from tripping hazards  Mobility: N/A  Cognition: N/A  Risk for Moderate/Major Injury: N/A  2. BMAT: Bedside Mobility Assessment Tool (BMAT)  Sit and Shake: Pass  Extend and Point: Pass  Stand: Pass  Step: Pass (Green: walk without assistive device)    Restraints:  No       See Docflowsheet for restraint documentation, interventions, education, etc.    Activity:  Mobility: good    If other, explain:     Hygiene:     Bath/Shower: Soap and water shower/bath     Oral Care: Moisturize, Brush     Urinary Catheter / Perineal Care: Self    Intake and Output:      Oral Supplement: Boost breeze    Last Bowel Movement Date: 06/13/23,           Date 06/15/23 0701 - 06/16/23 0700 06/16/23 0701 - 06/17/23 0700   Shift 0701-1900 1901-0700 24 Hour Total 0701-1900 1901-0700 24 Hour Total   INTAKE   P.O. 200 200 400      Shift Total(mL/kg) 200(2.2) 200(2.2) 400(4.5)      OUTPUT   Other           Stool Occurrence 0 x 0 x 0 x        Urine Occurrence  2 x 1 x 3 x      Shift Total(mL/kg)         NET 200 200 400      Weight (kg) 89.8 89.8 89.8 89.8 89.8 89.8

## 2023-06-16 NOTE — Progress Notes
[facility] END OF SHIFT/ JHFRAT NOTE    Admission Date: 06/02/2023  Length of Stay: LOS: 14 days    Acute events, interventions, provider communication: n/a     Patient Interventions and Education  Fall Risk/JHFRAT Interventions and Education: (Charting when applicable)   Total Fall Risk Score: Total Fall Risk Score: 5.   Elimination Interventions : N/A  Medications : Educate patient on medication side effects  Patient Care Equipment: Does not need assistance with patient care equipment when ambulating  Mobility: N/A  Cognition: N/A  Risk for Moderate/Major Injury: Surgery/procedure requiring anesthesia within past 24 hours and N/A  2. BMAT: Bedside Mobility Assessment Tool (BMAT)  Sit and Shake: Pass  Extend and Point: Pass  Stand: Pass  Step: Pass (Green: walk without assistive device)    Restraints:  No       See Docflowsheet for restraint documentation, interventions, education, etc.    Activity:  Mobility: good    If other, explain:     Hygiene:     Bath/Shower: Soap and water shower/bath     Oral Care: Moisturize, Brush     Urinary Catheter / Perineal Care: Self    Intake and Output:      Oral Supplement: Boost breeze    Last Bowel Movement Date: 06/15/23,           Date 06/15/23 0701 - 06/16/23 0700 06/16/23 0701 - 06/17/23 0700   Shift 0701-1900 1901-0700 24 Hour Total 0701-1900 1901-0700 24 Hour Total   INTAKE   P.O. 200 200 400 0  0   Shift Total(mL/kg) 200(2.2) 200(2.2) 400(4.5) 0(0)  0(0)   OUTPUT   Other           Stool Occurrence 0 x 0 x 0 x 0 x  0 x     Urine Occurrence  2 x 1 x 3 x 2 x  2 x   Shift Total(mL/kg)         NET 200 200 400 0  0   Weight (kg) 89.8 89.8 89.8 89.8 89.8 89.8

## 2023-06-16 NOTE — Progress Notes
Internal Medicine Progress Note    Name:  Barrie Schwitzer   [id number] Date:  06/16/2023  Admission Date: 06/02/2023  LOS: 14 days                     Assessment/Plan:    Principal Problem:    Elevated LFTs  Active Problems:    Difficult airway    Alan [name] is a 42 y.o. M with a history of Crohns disease, DMII, hypothyroidism, fibromyalgia, anxiety who was transferred from OSH for further workup/management of elevated LFTs.     Elevated AST, ALT- improving  Elevated T bili, alk phos- increasing  Pruritus likely 2/2 hyperbilirubinemia  - AST 132, ALT 320, ALP 325, TB of 5.1 at OSH  - Hepatitis panel negative  - CT abdomen at OSH shows mild esophagitis, small pericardial effusion, mild atrophic pancreas with faint calcifications due to chronic pancreatitis   - Ddx: DILI d/t Augmentin vs biliary pathology (I.e. PSC) 2/2 Crohn's Disease.  - Direct bilirubin significantly elevated. Lipase <3.    - EBV panel w/ positive IgG and negative IgM. CMV negative. HSV not detected. Iron panel wnl. Acute hepatitis panel negative. A1AT, ANA, ASMA, total immunoglobulins were wnl. HIV negative.  - AMA was positive  - ERCP 06/02/23 without significant abnormalities. FNA of porta hepatis lymph node obtained. Pathology resulted as polymorphous population of lymphocytes. No carcinoma identified in sampled specimen.   - RUQ U/S 06/02/23: Prior cholecystectomy with dilated CBD. Mild diffuse parenchymal hepatic disease, mild splenomegaly  - Repeat RUQ [place] 06/06/23: Prior cholecystectomy. Bile duct dilation has resolved. Mild hepatosplenomegaly.   - Fecal calprotectin elevated at 492.  - Hepatology has been following peripherally. After discussion with hepatology team on 12/13, liver biopsy obtained in IR on 06/14/23. 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 Mild steatosis. Portal tracts are expanded by an inflammatory infiltrate composed of lymphocytes, neutrophils, and eosinophils, with mild interface activity. The interlobular bile ducts appear intact with marked epithelial cell damage and infiltration by inflammatory cells. There are focal clusters of histiocytes with a granulomatous appearance, reminiscent of florid duct lesion. There is quite prominent ductular reaction with pericholangitis. The lobules show canalicular cholestasis with patchy lymphohistiocytic inflammation in the sinusoids. There is mild macrovesicular steatosis (~5%) without ballooned hepatocytes or Mallory hyaline. Trichrome stain shows portal expansion with pale staining in areas of edema (stage 1/4, Batts-Ludwig). Reticulin stain shows an intact framework. PASD stain shows no PAS-positive diastase resistant globules. Iron stain is negative. Immunostain for CK7 highlights ductular reaction with focal cholestatic effect in periportal hepatocytes. Copper stain is negative. CMV immunostain is negative for viral inclusions. EBV ISH will be reported by addendum. Special stains for GMS and AFB are negative for micro-organisms.   The differential diagnosis for cholestatic hepatitis includes an adverse drug reaction (e.g. antibiotics), biliary obstruction (e.g. passed gallstone/sludge), and viral etiologies (including hepatitis A and E). The overall histologic features with negative copper do not support an established chronic biliary disease, however, given the clinical history of inflammatory bowel disease and positive AMA, an underlying early/evolving chronic cholestatic process cannot be excluded.  - Discussed liver biopsy pathology with hepatology fellow, will be further discussed with staff for further recommendations  - Continue cholestyramine, increased to 8g BID 12/11.   - Started ursodeoxycholic acid 600mg  BID (initiated 500mg  as 600mg  was not an option inpatient).  - Holding PTA crestor 40mg  daily     Leukocytosis  Eosinophilia  - Patient has  been noted to have leukocytosis and eosinophilia since 2022. He follows with Hematology as an outpatient to address this. He last saw them 03/2023 with plans to repeat CBC and return to clinic in 3 months.   - He denies infectious symptoms at this time.   - CXR negative for acute process. UA not c/w UTI  - Dermatology consulted for rash, eosinophilia, elevated AST/ALT. Performed right upper arm punch biopsy on 06/07/23; pathology demonstrated epidermal necrosis suggestive of excoriation. Will need suture removed on 06/20/23. Initiated triamcinolone BID, defer anti-histamines at this time.      Trivial pericardial effusion  - Noted incidentally on CT A/P at OSH  - No chest pain, has diffuse pain due to fibromyalgia history  - TTE 06/02/23 w/ normal EF, normal CVP, no significant valvular abnormalities, trivial pericardial effusion     Chronic pancreatitis  - Lipase 12/8 <3.  - Continue PTA pancreatic enzymes supplements, morphine IR 15mg  q6h PRN     URI symptoms for one week prior to admission/Sinusitis  - S/p 5 day course of augmentin, CXR negative x2.   - Continue supportive care.      Crohn's disease history  - Follows with GI. Denies current black/bloody bowel movements  - PTA Stelera every 4 weeks    T2DM  - Last A1c 9.1 (06/05/2021); repeat 9.8% 06/03/23  - PTA tresiba 30 units BID, humalog 4 units TIDAC + SSI, farxiga   - Currently on glargine 25 units BID, aspart 6 units TIDAC, LDCF     Hypothyroidism  - TSH wnl  - Continue PTA levothyroxine 25 mcg daily     Anxiety disorder  - Continue PTA xanax 1mg  q6h PRN, nortriptyline 50mg  qHS    Isotonic hyponatremia  - Mild, worsened 12/11 due to hyperglycemia (pseudo hyponatremia).  - Triglycerides mildly elevated, but probably not enough to lower sodium to this degree  - Urine sodium 121, urine osm 910, serum osm 290  - AM cortisol wnl  - SPEP showed hypogammaglobulinemia   - CTM    FEN: No IVF, replace lytes PRN, diabetic diet   DVT ppx: lovenox   Code status: Full code    Dispo: Continue inpatient care    Complexity of medical decision making is high because of the acute exacerbation/progression of 1 or more chronic illness process/illness that poses threat to life or bodily function. Management is complex due to the need to review notes outside of my specialty, independent interpretation and review of each individual laboratory studies, discussing case with consulting physicians, daily assessment of need for continued admission versus escalation of care, communicating all new test results and counseling the patient in regards to further work-up and treatment.   ________________________________________________________________________    Subjective    No acute events overnight. Patient stated his post procedure pain has resolved. Still does not have much of an appetite but has been trying to push himself to eat. He is feeling weak in general. Discussed biopsy results however need further insight from hepatology team in terms of clinical significance and plan going forward.     Medications  Scheduled Meds:cholestyramine-aspartame (QUESTRAN LIGHT) 4 gram packet 8 g, 8 g, Oral, BID(10-22)  enoxaparin (LOVENOX) syringe 40 mg, 40 mg, Subcutaneous, QDAY(21)  gabapentin (NEURONTIN) capsule 300 mg, 300 mg, Oral, TID  insulin aspart (U-100) (NOVOLOG FLEXPEN U-100 INSULIN) injection PEN 0-12 Units, 0-12 Units, Subcutaneous, ACHS (22)  insulin aspart (U-100) (NOVOLOG FLEXPEN U-100 INSULIN) injection PEN 6 Units, 6 Units, Subcutaneous, TID w/ meals  insulin glargine (LANTUS SOLOSTAR U-100 INSULIN) injection PEN 25 Units, 25 Units, Subcutaneous, BID  levothyroxine (SYNTHROID) tablet 25 mcg, 25 mcg, Oral, QDAY 30 min before breakfast  lipase-protease-amylase (ZENPEP 40) capsule 1 capsule, 1 capsule, Oral, w/Snacks  lipase-protease-amylase (ZENPEP 40) capsule 2 capsule, 2 capsule, Oral, TID w/ meals  nortriptyline (PAMELOR) capsule 50 mg, 50 mg, Oral, QHS  pantoprazole DR (PROTONIX) tablet 40 mg, 40 mg, Oral, BID  polyethylene glycol 3350 (MIRALAX) packet 17 g, 1 packet, Oral, QDAY  sennosides-docusate sodium (SENOKOT-S) tablet 1 tablet, 1 tablet, Oral, QDAY  triamcinolone acetonide (KENALOG) 0.1 % topical ointment, , Topical, BID  ursodioL (URSO 250) tablet 500 mg, 500 mg, Oral, BID w/meals    Continuous Infusions:  PRN and Respiratory Meds:ALPRAZolam Q6H PRN, calcium carbonate Q4H PRN, camphor-menthol TID PRN, dextrose 50% (D50) IV PRN, fluticasone propionate BID PRN, HYDROmorphone (DILAUDID) injection Q4H PRN, melatonin QHS PRN, morphine Q6H PRN, ondansetron Q6H PRN **OR** ondansetron (ZOFRAN) IV Q6H PRN, traZODone QHS PRN      Review of Systems:  All other systems reviewed and are negative.    Objective:                          Vital Signs: Last Filed                 Vital Signs: 24 Hour Range   BP: 107/73 (12/18 0729)  Temp: 36.6 ?C (97.8 ?F) (12/18 2025)  Pulse: 98 (12/18 0729)  Respirations: 18 PER MINUTE (12/18 0729)  SpO2: 98 % (12/18 0729)  O2 Device: None (Room air) (12/18 0729) BP: (107-124)/(73-84)   Temp:  [36.4 ?C (97.6 ?F)-37.1 ?C (98.7 ?F)]   Pulse:  [87-98]   Respirations:  [16 PER MINUTE-18 PER MINUTE]   SpO2:  [98 %-99 %]   O2 Device: None (Room air)   Intensity Pain Scale (Self Report): 7 (06/15/23 2045) Vitals:    06/02/23 0100 06/02/23 1113 06/09/23 1515   Weight: 93 kg (205 lb 0.4 oz) 93 kg (205 lb 0.4 oz) 89.8 kg (197 lb 15.6 oz)       Intake/Output Summary:  (Last 24 hours)    Intake/Output Summary (Last 24 hours) at 06/16/2023 4270  Last data filed at 06/16/2023 0402  Gross per 24 hour   Intake 400 ml   Output --   Net 400 ml           Physical Exam  General appearance: Alert, cooperative and no distress  Head: NC/AT  Neck: No JVD  Lungs: Breathing non labored   Abdomen: Non distended  Extremities: No edema BLE  Neurologic: No focal deficit  Skin: Jaundiced. Suture in place at site of right arm punch biopsy    Lab Review  24-hour labs:    Results for orders placed or performed during the hospital encounter of 06/02/23 (from the past 24 hours) POC GLUCOSE    Collection Time: 06/15/23 11:38 AM   Result Value Ref Range    Glucose, POC 188 (H) 70 - 100 mg/dL   POC GLUCOSE    Collection Time: 06/15/23  4:01 PM   Result Value Ref Range    Glucose, POC 154 (H) 70 - 100 mg/dL   POC GLUCOSE    Collection Time: 06/15/23  9:10 PM   Result Value Ref Range    Glucose, POC 287 (H) 70 - 100 mg/dL   COMPREHENSIVE METABOLIC PANEL    Collection Time: 06/16/23  6:41 AM  Result Value Ref Range    Sodium 134 (L) 137 - 147 mmol/L    Potassium 3.9 3.5 - 5.1 mmol/L    Chloride 99 98 - 110 mmol/L    Glucose 157 (H) 70 - 100 mg/dL    Blood Urea Nitrogen 10 7 - 25 mg/dL    Creatinine 8.41 3.24 - 1.24 mg/dL    Calcium 9.2 8.5 - 40.1 mg/dL    Total Protein 6.7 6.0 - 8.0 g/dL    Total Bilirubin 6.3 (H) 0.2 - 1.3 mg/dL    Albumin 3.6 3.5 - 5.0 g/dL    Alk Phosphatase 027 (H) 25 - 110 U/L    AST 135 (H) 7 - 40 U/L    ALT 205 (H) 7 - 56 U/L    CO2 25 21 - 30 mmol/L    Anion Gap 10 3 - 12    Glomerular Filtration Rate (GFR) >60 >60 mL/min   CBC AND DIFF    Collection Time: 06/16/23  6:41 AM   Result Value Ref Range    White Blood Cells 13.10 (H) 4.50 - 11.00 10*3/uL    Red Blood Cells 4.66 4.40 - 5.50 10*6/uL    Hemoglobin 13.9 13.5 - 16.5 g/dL    Hematocrit 25.3 66.4 - 50.0 %    MCV 88.8 80.0 - 100.0 fL    MCH 29.9 26.0 - 34.0 pg    MCHC 33.7 32.0 - 36.0 g/dL    RDW 40.3 (H) 47.4 - 15.0 %    Platelet Count 514 (H) 150 - 400 10*3/uL    MPV 7.5 7.0 - 11.0 fL    Neutrophils 48 41 - 77 %    Lymphocytes 29 24 - 44 %    Monocytes 8 4 - 12 %    Eosinophils 13 (H) 0 - 5 %    Basophils 2 0 - 2 %    Absolute Neutrophil Count 6.20 1.80 - 7.00 10*3/uL    Absolute Lymph Count 3.80 1.00 - 4.80 10*3/uL    Absolute Monocyte Count 1.10 (H) 0.00 - 0.80 10*3/uL    Absolute Eosinophil Count 1.70 (H) 0.00 - 0.45 10*3/uL    Absolute Basophil Count 0.20 0.00 - 0.20 10*3/uL   POC GLUCOSE    Collection Time: 06/16/23  7:29 AM   Result Value Ref Range    Glucose, POC 161 (H) 70 - 100 mg/dL       Point of Care Testing  (Last 24 hours)  Glucose: (!) 157 (06/16/23 0641)  POC Glucose (Download): (!) 161 (06/16/23 0729)    Radiology and other Diagnostics Review:    Pertinent radiology reviewed.    [facility], DO   Pager

## 2023-06-16 NOTE — Progress Notes
[facility] END OF SHIFT/ JHFRAT NOTE    Admission Date: 06/02/2023  Length of Stay: LOS: 13 days    Acute events, interventions, provider communication: n/a     Patient Interventions and Education  Fall Risk/JHFRAT Interventions and Education: (Charting when applicable)   Total Fall Risk Score: Total Fall Risk Score: 6.   Elimination Interventions : N/A  Medications : Stay within arm's reach during toileting/showering (i.e., dizziness, orthostasis) , Educate patient on medication side effects, and Bed/chair alarm (i.e., change in mental status)   Patient Care Equipment: Does not need assistance with patient care equipment when ambulating and Ensure environment is free of clutter and walkways are clear from tripping hazards  Mobility: N/A  Cognition: N/A  Risk for Moderate/Major Injury: N/A  2. BMAT: Bedside Mobility Assessment Tool (BMAT)  Sit and Shake: Pass  Extend and Point: Pass  Stand: Pass  Step: Pass (Green: walk without assistive device)    Restraints:  No       See Docflowsheet for restraint documentation, interventions, education, etc.    Activity:  Mobility: good    If other, explain:     Hygiene:     Bath/Shower: Soap and water shower/bath     Oral Care: Moisturize, Brush     Urinary Catheter / Perineal Care: Self    Intake and Output:      Oral Supplement: Boost breeze    Last Bowel Movement Date: 06/13/23,           Date 06/14/23 0701 - 06/15/23 0700 06/15/23 0701 - 06/16/23 0700   Shift 0701-1900 1901-0700 24 Hour Total 0701-1900 1901-0700 24 Hour Total   INTAKE   P.O.  900 900 200  200   Shift Total(mL/kg)  900(10) 900(10) 200(2.2)  200(2.2)   OUTPUT   Other           Stool Occurrence 0 x 0 x 0 x 0 x  0 x     Urine Occurrence  0 x 2 x 2 x 2 x  2 x   Shift Total(mL/kg)         NET  900 900 200  200   Weight (kg) 89.8 89.8 89.8 89.8 89.8 89.8

## 2023-06-17 ENCOUNTER — Encounter: Admit: 2023-06-17 | Discharge: 2023-06-17 | Payer: Private Health Insurance - Indemnity

## 2023-06-17 DIAGNOSIS — F419 Anxiety disorder, unspecified: Secondary | ICD-10-CM

## 2023-06-17 DIAGNOSIS — E039 Hypothyroidism, unspecified: Secondary | ICD-10-CM

## 2023-06-17 DIAGNOSIS — Z79890 Hormone replacement therapy (postmenopausal): Secondary | ICD-10-CM

## 2023-06-17 DIAGNOSIS — K710 Toxic liver disease with cholestasis: Secondary | ICD-10-CM

## 2023-06-17 DIAGNOSIS — Z794 Long term (current) use of insulin: Secondary | ICD-10-CM

## 2023-06-17 DIAGNOSIS — I3139 Other pericardial effusion (noninflammatory): Secondary | ICD-10-CM

## 2023-06-17 DIAGNOSIS — Z9049 Acquired absence of other specified parts of digestive tract: Secondary | ICD-10-CM

## 2023-06-17 DIAGNOSIS — F17210 Nicotine dependence, cigarettes, uncomplicated: Secondary | ICD-10-CM

## 2023-06-17 DIAGNOSIS — L299 Pruritus, unspecified: Secondary | ICD-10-CM

## 2023-06-17 DIAGNOSIS — E785 Hyperlipidemia, unspecified: Secondary | ICD-10-CM

## 2023-06-17 DIAGNOSIS — K861 Other chronic pancreatitis: Secondary | ICD-10-CM

## 2023-06-17 DIAGNOSIS — E119 Type 2 diabetes mellitus without complications: Secondary | ICD-10-CM

## 2023-06-17 DIAGNOSIS — M797 Fibromyalgia: Secondary | ICD-10-CM

## 2023-06-17 DIAGNOSIS — R748 Abnormal levels of other serum enzymes: Secondary | ICD-10-CM

## 2023-06-17 DIAGNOSIS — T361X5A Adverse effect of cephalosporins and other beta-lactam antibiotics, initial encounter: Secondary | ICD-10-CM

## 2023-06-17 DIAGNOSIS — K5090 Crohn's disease, unspecified, without complications: Secondary | ICD-10-CM

## 2023-06-17 DIAGNOSIS — I10 Essential (primary) hypertension: Secondary | ICD-10-CM

## 2023-06-17 DIAGNOSIS — E871 Hypo-osmolality and hyponatremia: Secondary | ICD-10-CM

## 2023-06-17 DIAGNOSIS — D721 Eosinophilia, unspecified: Secondary | ICD-10-CM

## 2023-06-17 DIAGNOSIS — T360X5A Adverse effect of penicillins, initial encounter: Secondary | ICD-10-CM

## 2023-06-17 DIAGNOSIS — Z885 Allergy status to narcotic agent status: Secondary | ICD-10-CM

## 2023-06-17 DIAGNOSIS — F411 Generalized anxiety disorder: Secondary | ICD-10-CM

## 2023-06-17 LAB — POC GLUCOSE
~~LOC~~ BKR POC GLUCOSE: 250 mg/dL — ABNORMAL HIGH (ref 70–100)
~~LOC~~ BKR POC GLUCOSE: 193 mg/dL — ABNORMAL HIGH (ref 70–100)

## 2023-06-17 MED ORDER — ROSUVASTATIN 40 MG PO TAB
40 mg | ORAL | 0 refills | 90.00000 days | Status: AC
Start: 2023-06-17 — End: ?

## 2023-06-17 MED ORDER — URSODIOL 250 MG PO TAB
500 mg | ORAL_TABLET | Freq: Two times a day (BID) | ORAL | 1 refills | 30.00000 days | Status: AC
Start: 2023-06-17 — End: ?

## 2023-06-17 MED ORDER — CHOLESTYRAMINE-ASPARTAME 4 GRAM PO PWPK
8 g | PACK | Freq: Two times a day (BID) | ORAL | 1 refills | 90.00000 days | Status: AC
Start: 2023-06-17 — End: ?

## 2023-06-17 MED ORDER — ONDANSETRON 4 MG PO TBDI
4 mg | ORAL_TABLET | ORAL | 0 refills | 8.00000 days | Status: AC | PRN
Start: 2023-06-17 — End: ?

## 2023-06-17 NOTE — Progress Notes
Name: Alan [name]        MRN: [id number]          DOB: 05/28/1981            Age: 42 y.o.  Admission Date: 06/02/2023       LOS: 15 days    Date of Service: 06/17/2023    Day of Discharge Note    Day of discharge progress note for [facility]    Chart data reviewed including medications, consultation notes, lab, vitals, imaging.  Patient was seen and examined with pertinent information listed below.    Subjective: No acute events overnight. Patient reported improvement of itching overall. Discussed hepatology recommendations, he understands need for continued monitoring of LFTs outpatient and has hepatology follow up visit scheduled.     Exam: NAD, breathing non labored, no edema appreciated    Discharge plans and pertinent follow up items after discharge:     Principal Problem:    Elevated LFTs  Active Problems:    Difficult airway    Patient planning on getting labs drawn locally and will have results sent to hepatology team at [place].     Patient feels comfortable with plans for discharge.  All questions were answered.  Discharge discussion, including follow up/discharge instructions, occurred with patient face-to-face.      Alan Grammes [name], DO  06/17/2023    Discharge Planning: greater than 30 minutes spent in counseling pt, coordinating discharge care, placing discharge orders and complete discharge summary.

## 2023-06-17 NOTE — Discharge Instructions - Pharmacy
Discharge Summary      Name: Alan [name]  Medical Record Number: [id number]        Account Number:  [id number]  Date Of Birth:  1981/06/19                         Age:  42 y.o.  Admit date:  06/02/2023                     Discharge date: 06/17/2023      Discharge Attending:  Belenda Cruise Zeriyah Wain  Discharge Summary Completed By: Baird Cancer, DO    Service: Med Private J- [phone]    Reason for hospitalization:  Elevated LFTs [R79.89]    Primary Discharge Diagnosis:   Same as Above    Hospital Diagnoses:  Hospital Problems        Active Problems    * (Principal) Elevated LFTs    Difficult airway     Present on Admission:   Elevated LFTs        Significant Past Medical History        Accidental fall  Acid reflux  Allergy  Anxiety  Constipation  Crohn's disease (HCC)  Depression  Depressive disorder, not elsewhere classified  Essential hypertension  Fibromyalgia  Generalized headaches  Heartburn  Hyperlipidemia  Hypothyroid  Joint pain      Comment:  Im guessing  Nausea  Polycythemia, secondary  Psychiatric illness  Stomach disorder  Type II diabetes mellitus (HCC)  Ulcer  Vision decreased    Allergies   Iodinated contrast media, Iodine and iodide containing products, Azathioprine, Chlorhexidine, Chlorphen-phenyleph-hydrocodon, Gadolinium-containing contrast media, and Oxycodone    Brief Hospital Course   The patient was admitted and the following issues were addressed during this hospitalization: (with pertinent details including admission exam/imaging/labs).      Alan [name] is a 42 y.o. M with a history of Crohns disease, DMII, hypothyroidism, fibromyalgia, anxiety who was transferred from OSH for further workup/management of elevated LFTs.     AST 132, ALT 320, ALP 325, TB of 5.1 at OSH. Hepatitis panel negative. CT abdomen at OSH showed mild esophagitis, small pericardial effusion, mild atrophic pancreas with faint calcifications due to chronic pancreatitis.    Significant workup obtained this admission:  Direct bilirubin significantly elevated. Lipase <3. EBV panel w/ positive IgG and negative IgM. CMV negative. HSV not detected. Iron panel wnl. Acute hepatitis panel negative. A1AT, ANA, ASMA, total immunoglobulins were wnl. HIV negative.  AMA was positive. Fecal calprotectin elevated at 492.    TTE 06/02/23 w/ normal EF, normal CVP, no significant valvular abnormalities, trivial pericardial effusion     Underwent ERCP 06/02/23 without significant abnormalities. FNA of porta hepatis lymph node obtained. Pathology resulted as polymorphous population of lymphocytes. No carcinoma identified in sampled specimen.     RUQ U/S 06/02/23: Prior cholecystectomy with dilated CBD. Mild diffuse parenchymal hepatic disease, mild splenomegaly.    Repeat RUQ [place] 06/06/23: Prior cholecystectomy. Bile duct dilation has resolved. Mild hepatosplenomegaly.     Liver biopsy obtained in IR on 06/14/23. 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 Mild steatosis. Portal tracts are expanded by an inflammatory infiltrate composed of lymphocytes, neutrophils, and eosinophils, with mild interface activity. The interlobular bile ducts appear intact with marked epithelial cell damage and infiltration by inflammatory cells. There are focal clusters of histiocytes with a granulomatous appearance, reminiscent of florid duct lesion. There is quite prominent ductular reaction  with pericholangitis. The lobules show canalicular cholestasis with patchy lymphohistiocytic inflammation in the sinusoids. There is mild macrovesicular steatosis (~5%) without ballooned hepatocytes or Mallory hyaline. Trichrome stain shows portal expansion with pale staining in areas of edema (stage 1/4, Batts-Ludwig). Reticulin stain shows an intact framework. PASD stain shows no PAS-positive diastase resistant globules. Iron stain is negative. Immunostain for CK7 highlights ductular reaction with focal cholestatic effect in periportal hepatocytes. Copper stain is negative. CMV immunostain is negative for viral inclusions. EBV ISH will be reported by addendum. Special stains for GMS and AFB are negative for micro-organisms.   The differential diagnosis for cholestatic hepatitis includes an adverse drug reaction (e.g. antibiotics), biliary obstruction (e.g. passed gallstone/sludge), and viral etiologies (including hepatitis A and E). The overall histologic features with negative copper do not support an established chronic biliary disease, however, given the clinical history of inflammatory bowel disease and positive AMA, an underlying early/evolving chronic cholestatic process cannot be excluded.        Hepatology consulted. Overall, thought presentation was most likely from augmentin versus very early PBC. If in the outpatient setting, liver enzymes decrease and normalize, is most likely from the augmentin. If they remain elevated, patient not improved, most likely PBC. Continue ursodiol and cholestyramine, started this admission. Hepatology to arrange outpatient follow up and repeat labs for continued monitoring of LFTs.     Dermatology consulted for rash, eosinophilia, elevated AST/ALT. Performed right upper arm punch biopsy on 06/07/23; pathology demonstrated epidermal necrosis suggestive of excoriation. Will need suture removed on 06/20/23.       Items Needing Follow Up   Pending items or areas that need to be addressed at follow up: Will need repeat CMP to monitor LFTs    Pending Labs and Follow Up Radiology    Pending labs and/or radiology review at this time of discharge are listed below: Please note- any labs with collected status will not have a result; if this area is blank, there are no items for review.   Pending Labs       Order Current Status    LAB SAMPLE HOLD Collected (06/10/23 0954)    HEPATITIS E VIRUS (HEV) ANTIBODY, IGG In process    HEPATITIS E VIRUS (HEV) ANTIBODY, IGM In process    POC GLUCOSE In process    POC GLUCOSE In process    POC GLUCOSE In process    POC GLUCOSE In process    POC GLUCOSE In process    POC GLUCOSE In process    POC GLUCOSE In process    POC GLUCOSE In process    POC GLUCOSE In process    POC GLUCOSE In process    POC GLUCOSE In process    POC GLUCOSE In process    POC GLUCOSE In process    POC GLUCOSE In process    POC GLUCOSE In process    POC GLUCOSE In process    POC GLUCOSE In process    POC GLUCOSE In process    POC GLUCOSE In process    POC GLUCOSE In process    POC GLUCOSE In process    POC GLUCOSE In process    POC GLUCOSE In process    POC GLUCOSE In process    POC GLUCOSE In process    POC GLUCOSE In process    POC GLUCOSE In process    POC GLUCOSE In process    POC GLUCOSE In process    POC GLUCOSE In process  POC GLUCOSE In process    POC GLUCOSE In process    POC GLUCOSE In process    POC GLUCOSE In process    POC GLUCOSE In process              Medications      Medication List      START taking these medications     cholestyramine-aspartame 4 gram packet; Commonly known as: QUESTRAN   LIGHT; Dose: 8 g; Take two packets by mouth twice daily (at 10AM and   10PM). Mix with at least 4 oz water or non-carbonated liquid; take other   medications 1 hour before or 4 hours after cholestyramine powder    Indications: itching associated with blockage of a bile duct; For: itching   associated with blockage of a bile duct; Quantity: 180 packet; Refills: 1   ondansetron 4 mg rapid dissolve tablet; Commonly known as: ZOFRAN ODT;   Dose: 4 mg; Dissolve one tablet by mouth every 6 hours as needed. Place on   tongue to dissolve.  Indications: nausea; For: nausea; Quantity: 30   tablet; Refills: 0   ursodioL 250 mg tablet; Commonly known as: URSO 250; Dose: 500 mg; Take   two tablets by mouth twice daily with meals. 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 Hold until follow up with hepatology    Indications: excessive fat in the blood; For: excessive fat in the blood;   Refills: 0; What changed: additional instructions   STELARA 90 mg/mL syringe; Generic drug: ustekinumab; Dose: 90 mg;   Doctor's comments: Needs prior authorization; Inject 1 mL under the skin   every 28 days.; Quantity: 1 mL; Refills: 3; What changed: See the new   instructions.     CONTINUE taking these medications     ALPRAZolam 1 mg tablet; Commonly known as: XANAX; Dose: 1 mg; Refills: 0   chlordiazePOXIDE-clidinium 2.5/5 mg capsule; Commonly known as: LIBRAX   (WITH CLIDINIUM); Dose: 1 capsule; TAKE 1 CAPSULE BY MOUTH THREE TIMES A   DAY; Quantity: 270 capsule; Refills: 2   dapagliflozin propanediol 10 mg tablet; Commonly known as: FARXIGA;   Dose: 10 mg; Refills: 0   dicyclomine 20 mg tablet; Commonly known as: BENTYL; Dose: 20 mg; TAKE   ONE TABLET BY MOUTH FOUR TIMES DAILY AS NEEDED; Quantity: 360 tablet;   Refills: 1   diphenoxylate-atropine 2.5-0.025 mg tablet; Commonly known as: LOMOTIL;   Dose: 1 tablet; Doctor's comments: Not to exceed 5 additional fills before   12/06/2022; TAKE 1 TABLET BY MOUTH FOUR TIMES A DAY AS NEEDED FOR   DIARRHEA; Quantity: 60 tablet; Refills: 0   ERGOcalciferoL (vitamin D2) 1,250 mcg (50,000 unit) capsule; Commonly   known as: DRISDOL; TAKE 1 CAPSULE BY MOUTH EVERY 7 DAYS FOR VITAMIN D   DEFICIENCY; Quantity: 12 capsule; Refills: 0   famotidine 20 mg tablet; Commonly known as: PEPCID; Dose: 20 mg;   Refills: 0   HumaLOG KwikPen Insulin 100 unit/mL subcutaneous PEN; Generic drug:   insulin lispro (U-100); Inject four units under the skin three times daily   with meals. Correct for blood sugar over 220. BG 221-260: give 2 units. BG  261-300 give 4 units. BG 301-350: give 6 units. 351-400: 8 units. >400: 10   units; Quantity: 15 mL; Refills: 0   hydrOXYzine HCL 25 mg tablet; Commonly known as: ATARAX; Dose: 25 mg;   Take one tablet by mouth every 6 hours as needed. Indications: anxious;   For: anxious; Quantity: 30 tablet; Refills: 0   L-Methylfolate 15 mg tablet; Commonly known as: DEPLIN 15; Dose: 15 mg;   Refills: 0   levothyroxine 25 mcg tablet; Commonly known as: SYNTHROID; Dose: 25 mcg;   Refills: 0   NIFEDIPINE/LIDOCAINE 0.3%/1.5% IN PETROLATUM OINTMENT (BATCHED   COMPOUND); Doctor's comments: updated to pre-batched qty 60g; Apply to   anal area three times daily as needed; Quantity: 45 g; Refills: 3   nortriptyline 50 mg capsule; Commonly known as: PAMELOR; Dose: 50 mg;   Take one capsule by mouth at bedtime daily.; Quantity: 30 capsule;   Refills: 0   PANCREAZE 37,000-97,300- 149,900 unit capsule; Generic drug:   lipase-protease-amylase; Dose: 1 capsule; Take one capsule by mouth three   times daily. Please use these instructions: Take two capsules WITH each   meal, and one capsule with each snack that contains fat or protein. May   have three snacks daily.; Quantity: 270 capsule; Refills: 3   pantoprazole DR 40 mg tablet; Commonly known as: PROTONIX; Dose: 40 mg;   TAKE 1 TABLET BY MOUTH TWICE A DAY; Quantity: 180 tablet; Refills: 3   pramoxine-hydrocortisone 1-1 % rectal cream; Commonly known as:   ANALPRAM-HC; Dose: 1 g; Insert or Apply one g to rectal area as directed   twice daily.; Quantity: 30 g; Refills: 1   TRESIBA FLEXTOUCH U-200 200 unit/mL (3 mL) suncutaneous PEN; Generic   drug: insulin degludec; Dose: 30 Units; Refills: 0   VASCEPA 1 gram capsule; Generic drug: icosapent ethyL; Refills: 0       Return Appointments and Scheduled Appointments     Scheduled appointments:      Jul 05, 2023 9:00 AM  Telehealth visit with Windy Canny, APRN-NP  Hepatology: [facility] ([facility] [place]) [address]  [address], [address]  [place] [address]  [phone]     Aug 04, 2023 10:00 AM  (Arrive by 9:30 AM)  GASTRIC EMPTYING TIME with NUCLEAR MEDICINE [id number]  Imaging, Nuclear Medicine: [facility] (Radiology) [address]  [address], [address] [place] [address]  [phone]     Aug 10, 2023 3:30 PM  Telehealth visit with Amadeo Garnet, APRN-NP  Gastroenterology: [facility] (Internal Medicine) [address]  [address]  [place] [address]  [phone]            Consults, Procedures, Diagnostics, Micro, Pathology   Consults: Dermatology and Hepatology  Surgical Procedures & Dates: None  Significant Diagnostic Studies, Micro and Procedures: noted in brief hospital course  Significant Pathology: noted in brief hospital course                                   Discharge Disposition, Condition   Patient Disposition: Home or Self Care [01]  Condition at Discharge: Stable    Code Status   Prior    Patient Instructions     Activity       Activity as Tolerated   As directed      It is important to keep increasing your activity level  after you leave the hospital.  Moving around can help prevent blood clots, lung infection (pneumonia) and other problems.  Gradually increasing the number of times you are up moving around will help you return to your normal activity level more quickly.  Continue to increase the number of times you are up to the chair and walking daily to return to your normal activity level. Begin to work toward your normal activity level at discharge          Diet       Diabetic Diet   As directed      You should eat between 1600 and 2000 calories per day.  This is equal to 60g (grams) of carbohydrates per meal, and 30g of carbohydrates for a bedtime snack.    If you have questions about your diet after you go home, you can call a dietitian at [phone].             Discharge education provided to patient., Signs and Symptoms:   Report these signs and symptoms       Report These Signs and Symptoms   As directed      Please contact your doctor if you have any of the following symptoms: temperature higher than 100.4 degrees F, uncontrolled pain, persistent nausea and/or vomiting, difficulty breathing, chest pain, severe abdominal pain, headache, unable to urinate, unable to have bowel movement, or drainage with a foul odor        , Education: , and Others Instructions:   Other Orders       Questions About Your Stay   Complete by: As directed      Discharging attending physician: Baird Cancer    Order comments: If you have an emergency after discharge, please dial [phone].    You may contact your discharging physician up to 7 days after discharge for questions about your hospitalization, discharge instructions, or medications by calling [phone] during regular business hours (8AM-4PM) and asking to speak to the doctor listed on the discharge information.       If you are not calling during business hours, ask for the on-call doctor.    If you have new  or worsening symptoms, you may be directed to your primary care provider (PCP) for ongoing questions, an Emergency Department, or an Urgent Care Clinic for a more immediate evaluation.    For all calls or questions more than 7 days after discharge, please contact your primary care provider (PCP).    For medications after discharge: pain (opioid) medicine cannot be refilled or prescribed by calling your discharging physician.  These medications need to be filled by your primary care provider (PCP).  Regular refill requests should be directed to your primary care provider (PCP).            Additional Orders: Case Management, Supplies, Home Health     Home Health/DME       None              Signed:  Baird Cancer, DO  06/17/2023      cc:  Primary Care Physician:  Rockwell Germany   Verified    Referring physicians:  Sylvester Harder, PA   Additional provider(s):        Did we miss something? If additional records are needed, please fax a request on office letterhead to [phone]. Please include the patient's name, date of birth, fax number and type of information needed. Additional request can be made by email  at [email or web address]. For general questions of information about electronic records sharing, call [phone].

## 2023-06-17 NOTE — Care Coordination-Inpatient
INPATIENT HEPATOLOGY CARE COORDINATION    Patient: Alan [name]  MRN: [id number]  06/16/2023    Admit Date:06/02/2023  Discharge Dates: Anticipate 06/17/2023  Discharge Disposition: Home    Rounding Physician: Dr Tasia Catchings    Brief narrative regarding inpatient admission: 42 y.o. male. With past medical history of crohn's disease, diabetes, GAD, fibromyalgia presented to [facility] as a transfer from outside hospital for elevated LFTs and itching.     Liver biopsy path reviewed in recent Path conference.   A. 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 See comment.               Mild steatosis.     Per Fellow note/discussion w/ patient. Overall, most likely from Augmentin versus very early PBC.    Hepatology Follow-up: [facility] HFU scheduled w/ Dr Telford Nab clinic. Reviewed w/ patient. Details of appt will be on DC AVS. Reviewed biopsy results and improvements in LFTs.    Has PTA scheduled clinic follow up w/ GI.      Appointment Provider/Date/Time:   Date & Time  07/05/2023  9:00 AM [facility] HFU Provider  Windy Canny, APRN-NP      Primary Hepatology CNC: Jerlyn Ly RN  Primary Hepatologist: Dr Dorothey Baseman      Clinic Coordinator Follow up: Labs      Labs: CMP ordered weekly following hospital DC, until follow up visit in Jan. Patient agreeable. This lab was faxed to [name] per patient request. Fax confirmation received. [phone].      Pharmacy: Patient denied OTC supplements or other illicite substances prior to coming in. Reviewed Augmentin as the likely culprit in his DILI, would recommend adding this med to his allergies and avoid in the future. He verbalized understanding.      Education: Call placed to patient today. Anticipate DC soon. Reviewed above DC plan. Patient was w/o questions today. Encouraged patient call our clinic with any hepatology needs after discharge. [phone].     ~Please reach out with questions. Discharge information will be sent to the clinic hepatology team for ongoing outpatient management after DC.     Francis Dowse, RN-BSN  Inpatient Hepatology Nurse Coordinator  [facility]

## 2023-06-17 NOTE — Progress Notes
BH 53 END OF SHIFT/ JHFRAT NOTE    Admission Date: 06/02/2023  Length of Stay: LOS: 15 days    Acute events, interventions, provider communication: n/a     Patient Interventions and Education  Fall Risk/JHFRAT Interventions and Education: (Charting when applicable)   Total Fall Risk Score: Total Fall Risk Score: 5.   Elimination Interventions : N/A  Medications : Educate patient on medication side effects  Patient Care Equipment: Does not need assistance with patient care equipment when ambulating  Mobility: N/A  Cognition: N/A  Risk for Moderate/Major Injury: N/A  2. BMAT: Bedside Mobility Assessment Tool (BMAT)  Sit and Shake: Pass  Extend and Point: Pass  Stand: Pass  Step: Pass (Green: walk without assistive device)    Restraints:  No       See Docflowsheet for restraint documentation, interventions, education, etc.    Activity:  Mobility: good    If other, explain:     Hygiene:     Bath/Shower: Soap and water shower/bath     Oral Care: Moisturize, Brush     Urinary Catheter / Perineal Care: Self    Intake and Output:      Oral Supplement: Boost breeze    Last Bowel Movement Date: 06/13/23,           Date 06/16/23 0701 - 06/17/23 0700 06/17/23 0701 - 06/18/23 0700   Shift 0701-1900 1901-0700 24 Hour Total 0701-1900 1901-0700 24 Hour Total   INTAKE   P.O. 0 400 400      Shift Total(mL/kg) 0(0) 400(4.5) 400(4.5)      OUTPUT   Other           Stool Occurrence 0 x 0 x 0 x        Urine Occurrence  2 x 0 x 2 x      Shift Total(mL/kg)         NET 0 400 400      Weight (kg) 89.8 89.8 89.8 89.8 89.8 89.8

## 2023-06-17 NOTE — Progress Notes
Alan [name] discharged on 06/17/2023.   Marland Kitchen  Discharge instructions reviewed with patient.  Valuables returned: none  ADL Belongings at Bedside: None.  Home medications: none   .  Functional assessment at discharge complete: No.    Removed patient's PIV. Printed and reviewed AVS with patient. Placed signature in chart. Pt discharged from unit by self at 1059 to meet father at main entrance for transportation.

## 2023-06-17 NOTE — Progress Notes
Pharmacy Benefits Investigation    Medication name: ustekinumab (STELARA) 90 mg syringe  Medication status: new  Medication regimen: Inject 1mL under the skin every 28 days    The prior authorization was approved for Alan [name] through 06/14/2024.    The out of pocket cost is unknown because the medication cannot be filled at [facility]. The patient's insurance mandates they fill the medication at [facility] [place], [place] - [address]    Notified the clinic pharmacist for next steps.    Maree Erie  Specialty Pharmacy Patient Advocate

## 2023-06-18 ENCOUNTER — Encounter: Admit: 2023-06-18 | Discharge: 2023-06-18 | Payer: Private Health Insurance - Indemnity

## 2023-06-18 DIAGNOSIS — K5090 Crohn's disease, unspecified, without complications: Secondary | ICD-10-CM

## 2023-06-18 DIAGNOSIS — R7989 Other specified abnormal findings of blood chemistry: Secondary | ICD-10-CM

## 2023-06-18 DIAGNOSIS — K719 Toxic liver disease, unspecified: Secondary | ICD-10-CM

## 2023-06-18 LAB — HEPATITIS E VIRUS (HEV) ANTIBODY, IGM: ~~LOC~~ BKR HEPATITIS E AB IGM: NEGATIVE

## 2023-06-18 LAB — HEPATITIS E VIRUS (HEV) ANTIBODY, IGG: ~~LOC~~ BKR HEPATITIS E AB IGG: NEGATIVE

## 2023-06-18 MED ORDER — STELARA 90 MG/ML SC SYRG
90 mg | SUBCUTANEOUS | 3 refills | 38.50000 days | Status: AC
Start: 2023-06-18 — End: ?

## 2023-06-22 ENCOUNTER — Encounter: Admit: 2023-06-22 | Discharge: 2023-06-22 | Payer: Private Health Insurance - Indemnity

## 2023-06-25 ENCOUNTER — Encounter: Admit: 2023-06-25 | Discharge: 2023-06-25 | Payer: Private Health Insurance - Indemnity

## 2023-06-25 NOTE — Telephone Encounter
LVM for pt asking for a return call to review results.

## 2023-06-25 NOTE — Telephone Encounter
 Pt called returning Morgan's call. Transferred to [place].

## 2023-06-25 NOTE — Telephone Encounter
Spoke with pt. Reviewed stable lab results and recommendations to repeat labs next week, orders sent to [facility] in [place]. Pt verbalized understanding and did not have any other questions or concerns at this time.

## 2023-06-29 ENCOUNTER — Encounter: Admit: 2023-06-29 | Discharge: 2023-06-29 | Payer: Private Health Insurance - Indemnity

## 2023-06-29 DIAGNOSIS — K5090 Crohn's disease, unspecified, without complications: Secondary | ICD-10-CM

## 2023-06-29 MED ORDER — STELARA 90 MG/ML SC SYRG
90 mg | SUBCUTANEOUS | 1 refills | 38.50000 days | Status: AC
Start: 2023-06-29 — End: ?

## 2023-06-29 MED ORDER — STELARA 90 MG/ML SC SYRG
90 mg | SUBCUTANEOUS | 0 refills | 38.50000 days | Status: AC
Start: 2023-06-29 — End: ?

## 2023-06-29 NOTE — Telephone Encounter
06/29/2023 12:15 PM   Need to fax 90-day hard copy of Stelara prescription to patient's new Specialty pharmacy, [facility].

## 2023-07-05 ENCOUNTER — Encounter: Admit: 2023-07-05 | Discharge: 2023-07-05 | Payer: Private Health Insurance - Indemnity

## 2023-07-05 ENCOUNTER — Ambulatory Visit: Admit: 2023-07-05 | Discharge: 2023-07-06 | Payer: Private Health Insurance - Indemnity

## 2023-07-05 DIAGNOSIS — K719 Toxic liver disease, unspecified: Secondary | ICD-10-CM

## 2023-07-05 DIAGNOSIS — R7989 Other specified abnormal findings of blood chemistry: Secondary | ICD-10-CM

## 2023-07-05 NOTE — Progress Notes
Telehealth Visit Note    Date of Service: 07/05/2023    Obtained patient's, or patient proxy's, verbal consent to treat them and their agreement to [facility] financial policy and NPP via this telehealth visit during the [facility].      Subjective:            History of Present Illness         Alan [name] is a 43 y.o. male presenting to the hepatology clinic via telehealth alone today for hospital follow-up and continued management of elevated liver tests. Other history significant for Crohn's disease, diabetes, hypothyroidism, fibromyalgia, anxiety, chronic pancreatitis.     Briefly, patient was hospitalized from 06/02/23 through 06/17/23 after being transferred from outside hospital for elevated liver tests with AST 132 ALT 320 alk phos 325 total bili 5.1. Extensive serological hepatology work-up completed, overall unremarkable other than + AMA.     ERCP 06/02/23 without significant abnormalities. Abdominal US showed resolved bile duct dilatation, mild hepatosplenomegaly.     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 Mild macrovesicular steatosis. Overall thought likely drug-induced-liver-injury secondary to recent Augmentin use, though PBC could not be ruled out with elevated alkaline phosphatase and + AMA. He was initiated on ursodiol and cholestyramine.         [facility]  Since hospital discharge patient reports doing fair. He continues to have significant fatigue.     He notes no more abdominal pain than usual for him with his Crohn's disease, he does have some occasional nausea with eating, this is a change. He denies vomiting, blood in his stools, lower extremity edema, jaundice. He notes his itching is improved and questions if he can discontinue the cholestyramine.     He continues on ursodiol 500mg  BID. We discussed how this medication may be contributing to some nausea and the more frequent diarrhea patient reports.     With continued GI symptoms he is scheduled for a gastric emptying test on 08/03/13,  esophageal motility study on 08/10/23, esophageal balloon distention study on 08/10/23, and GI follow-up on 08/10/23.     His most recent liver tests on 06/29/23 show normal AST/ALT, improved alkaline phosphatase, see below.     He denies alcohol consumption. He smokes < 1 pack of cigarettes daily, denies other substance use.    He has an appointment with his PCP [facility] on 07/09/23.     Recent Labs/imaging:   Pertinent labs were reviewed on initiation of note and below:    Lab Results   Component Value Date    TOTPROT 7.2 06/29/2023    ALBUMIN 3.5 06/29/2023    TOTBILI 1.92 (H) 06/29/2023    ALKPHOS 178 (H) 06/29/2023    AST 22 06/29/2023    ALT 43 06/29/2023      Lab Results   Component Value Date    NA 134 (L) 06/29/2023    K 3.5 06/29/2023    BUN 11.0 06/29/2023    CR 0.86 06/29/2023      Lab Results   Component Value Date    WBC 10.50 06/17/2023    HGB 13.6 06/17/2023    PLTCT 504 (H) 06/17/2023    INR 1.0 06/14/2023        MELD 3.0: 16 at 06/16/2023  6:41 AM  MELD-Na: 16 at 06/16/2023  6:41 AM  Calculated from:  Serum Creatinine: 0.69 mg/dL (Using min of 1 mg/dL) at 84/69/6295  2:84  AM  Serum Sodium: 134 mmol/L at 06/16/2023  6:41 AM  Total Bilirubin: 6.3 mg/dL at 57/32/2025  4:27 AM  Serum Albumin: 3.6 g/dL (Using max of 3.5 g/dL) at 12/20/7626  3:15 AM  INR(ratio): 1 at 06/14/2023  6:21 AM  Age at listing (hypothetical): 42 years  Sex: Male at 06/16/2023  6:41 AM           Abdominal US 06/06/23 showed mild hepatomegaly, homogeneous appearance. No focal hepatic observations, no intrahepatic bile duct dilatation (resolved since 06/02/23), mild splenomegaly. No ascites.     Patient denies other complaints or concerns at this time.       Review of Systems  A complete 10 point review of systems was negative except those listed in the HPI.    Reviewed and updated active medication and allergy list. ALPRAZolam (XANAX) 1 mg tablet Take one tablet by mouth as Needed.    chlordiazePOXIDE-clidinium (LIBRAX (WITH CLIDINIUM)) 2.5/5 mg capsule TAKE 1 CAPSULE BY MOUTH THREE TIMES A DAY    cholestyramine-aspartame (QUESTRAN LIGHT) 4 gram packet Take two packets by mouth twice daily (at 10AM and 10PM). Mix with at least 4 oz water or non-carbonated liquid; take other medications 1 hour before or 4 hours after cholestyramine powder  Indications: itching associated with blockage of a bile duct    dapagliflozin propanediol (FARXIGA) 10 mg tablet Take one tablet by mouth every morning.    dicyclomine (BENTYL) 20 mg tablet TAKE ONE TABLET BY MOUTH FOUR TIMES DAILY AS NEEDED    diphenoxylate-atropine (LOMOTIL) 2.5-0.025 mg tablet TAKE 1 TABLET BY MOUTH FOUR TIMES A DAY AS NEEDED FOR DIARRHEA    ERGOcalciferoL (vitamin D2) (DRISDOL) 1,250 mcg (50,000 unit) capsule TAKE 1 CAPSULE BY MOUTH EVERY 7 DAYS FOR VITAMIN D DEFICIENCY    famotidine (PEPCID) 20 mg tablet Take one tablet by mouth as Needed.    HUMALOG KWIKPEN INSULIN 100 unit/mL subcutaneous PEN Inject four units under the skin three times daily with meals. Correct for blood sugar over 220. BG 221-260: give 2 units. BG 261-300 give 4 units. BG 301-350: give 6 units. 351-400: 8 units. >400: 10 units    hydrOXYzine HCL (ATARAX) 25 mg tablet Take one tablet by mouth every 6 hours as needed. Indications: anxious    L-Methylfolate (DEPLIN 15) 15 mg tablet Take one tablet by mouth daily.    levothyroxine (SYNTHROID) 25 mcg tablet Take one tablet by mouth daily 30 minutes before breakfast.    lipase-protease-amylase (PANCREAZE) 37,000-97,300- 149,900 unit capsule Take one capsule by mouth three times daily. Please use these instructions: Take two capsules WITH each meal, and one capsule with each snack that contains fat or protein. May have three snacks daily.    NIFEDIPINE/LIDOCAINE 0.3%/1.5% IN PETROLATUM OINTMENT (BATCHED COMPOUND) Apply to anal area three times daily as needed    nortriptyline (PAMELOR) 50 mg capsule Take one capsule by mouth at bedtime daily.    ondansetron (ZOFRAN ODT) 4 mg rapid dissolve tablet Dissolve one tablet by mouth every 6 hours as needed. Place on tongue to dissolve.  Indications: nausea    pantoprazole DR (PROTONIX) 40 mg tablet TAKE 1 TABLET BY MOUTH TWICE A DAY    pramoxine-hydrocortisone (ANALPRAM-HC) 1-1 % rectal cream Insert or Apply one g to rectal area as directed twice daily.    rosuvastatin (CRESTOR) 40 mg tablet Take one tablet by mouth twice weekly. Hold until follow up with hepatology  Indications: excessive fat in the blood    TRESIBA FLEXTOUCH U-200  200 unit/mL (3 mL) injection PEN thirty Units twice daily. Plus a Sliding Scale.    ursodioL (URSO 250) 250 mg tablet Take two tablets by mouth twice daily with meals. 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    ustekinumab (STELARA) 90 mg syringe Inject 1 mL under the skin every 28 days.    VASCEPA 1 gram capsule TAKE 2 CAPSULES BY MOUTH TWICE A DAY           Objective:         No vital signs were available for this telehealth visit.            MELD 3.0: 16 at 06/16/2023  6:41 AM  MELD-Na: 16 at 06/16/2023  6:41 AM  Calculated from:  Serum Creatinine: 0.69 mg/dL (Using min of 1 mg/dL) at 62/37/6283  1:51 AM  Serum Sodium: 134 mmol/L at 06/16/2023  6:41 AM  Total Bilirubin: 6.3 mg/dL at 76/16/0737  1:06 AM  Serum Albumin: 3.6 g/dL (Using max of 3.5 g/dL) at 26/94/8546  2:70 AM  INR(ratio): 1 at 06/14/2023  6:21 AM  Age at listing (hypothetical): 42 years  Sex: Male at 06/16/2023  6:41 AM        Physical Exam    Physical Exam    Due to the nature of the visit via TeleHealth/telephone encounter there is an inability to fully asses the patient. Therefore a limited physical exam documented for this encounter.    Limited Observational Exam   Constitutional: Patient appears well-nourished. No distress. Pleasant and conversant.   Pulmonary/Chest: Effort normal    Neurological: Patient is alert and oriented to person, place, and time.   Skin: Normal color for race                  Assessment/Plan/Recommendations      Elevated liver tests:      DDx for chronically elevated liver tests includes alcohol; viral hepatitis; autoimmune disorders (AIH, PSC, PBC); metabolic/hereditary (NAFLD/NASH, A1AT, hemochromatosis, Wilson's), with NAFLD/NASH being a diagnosis of exclusion and with the presence of steatosis by imaging and/or biopsy; congestive hepatopathy (heart failure-related liver disease); non liver related causes such as thyroid disease and celiac sprue; and systemic diseases including infiltrative disease (typically a cholestatic pattern).  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 Labs from 06/29/23: AST 22 ALT 43 Alk phos 178.  With near resolution of tests thought likely DILI as cause of elevations  Will continue to monitor monthly labs x2, follow-up TH visit in 3-4 months to re-evaluate        Routine Health Care in Patient with Chronic Liver Disease:  We recommend screening for hepatitis A and B, and vaccination if not immune.  All patients with liver disease are at an increased risk for osteoporosis.  We strongly recommend screening for Vitamin D deficiency with aggressive supplementation/replacement, as indicated. Screening for all fat soluble vitamin deficiencies should be performed in those patients with cholestatic liver disease.   Baseline DEXA scan should be performed to evaluate for decreased bone mineral density. If present, Ca/Vit D +/- bisphosphonate therapy should be considered.Follow up DEXA scans shall be based  on prior scans as well as therapies. It is not uncommon for our patients to be referred to an Endocrinologist/bone health specialist.  The preferred analgesic in cirrhosis and liver disease is Tylenol, up to 2g daily.    Pruritus  Itching, or pruritus, in patients with liver disease can be extremely uncomfortable. It occurs when bile salts that are not being normally excreted, accumulate in the skin. The bile salts irritate the skin cells resulting in pruritus. The following measures have been found successful in decreasing  pruritus either by stabilizing the skin cells or by assisting in the excretion of bile:  Decrease water temperature of shower or bath.  Use a moisturizing soap such as Dove and avoid deodorant soaps.  Do not dry off completely after shower/bath.  After shower/bath, while skin is still damp, apply cream to all skin that is itching.  Creams such as Eucerin or Sarna lotion can be bought almost anywhere ([facility], [employer], [employer], etc.)  Set designer clothes.  Sleep with fewer clothes and blankets  Patient would like to discontinue cholestyramine- okay to trial off, let us know if he resumes      Fatigue:  Patient reports continued significant fatigue, history of fibromyalgia  Will send work-release  for week of 1/6-1/10, will be taken over by PCP on Friday 07/09/23 following PCP visit  Patient works as Heritage manager for [employer] to follow with PCP for further management.     Tobacco Use History:   The patient has a history of tobacco use and tobacco cessation was recommended.     Vaccinations:  Immunity to hepatitis A/B is unknown  Recommend booster series if low/no immunity. May obtain with our office or with PCP office.   Recommend patient receive annual flu vaccination and stay up-to-date on COVID vaccinations. Recommend staying current on pneumonia vaccinations if patient-appropriate.       Health Maintenance & Cancer Screening:  Recommend staying up to date for cancer screening: mammograms/PAP for women, prostate cancer screening for men, colon cancer screening for all.  Most recent colonoscopy 03/18/22 showed few scattered small apthous erosions in distal ileu, non-bleeding external hemorrhoids. Otherwise normal. Recommended to repeat in 2026- Continue to follow with GI team for further guidance.        Follow Up:   3-4 months via telehealth in clinic or sooner if needed. The patient is to call our office with any questions or changes to your health condition.     A total of 60 minutes was spent in the following activities: Preparing to see the patient, Obtaining and/or reviewing separately obtained history, Performing a medically appropriate examination and/or evaluation, Counseling and educating the patient/family/caregiver, Ordering medications, tests, or procedures, Documenting clinical information in the electronic or other health record and Independently interpreting results (not separately reported) and communicating results to the patient/family/caregiver.    Patient/patient family reports that all questions have been answered at this time.  No additional pending concerns.    Thank you very much for the opportunity to participate in the care of this patient.  If you have any further questions, please don't hesitate to contact our office.     _____________________________  Jolee Ewing, APRN, FNP-C  Hepatology & Liver Transplantation  [facility]  [phone]  Fax: [phone]

## 2023-07-06 ENCOUNTER — Encounter: Admit: 2023-07-06 | Discharge: 2023-07-06 | Payer: Private Health Insurance - Indemnity

## 2023-07-06 MED ORDER — STELARA 90 MG/ML SC SYRG
90 mg | SUBCUTANEOUS | 0 refills | 38.50000 days | Status: AC
Start: 2023-07-06 — End: ?

## 2023-07-06 NOTE — Progress Notes
Voicemail received from [facility] stating that they received a cancellation for Stelara on 1/6. Confirmed with GI RN that no cancellation was submitted 1/6.     New Rx sent to [facility] with instructions to fill and ship medication ASAP.     Mliss Fritz, PHARMD

## 2023-07-25 ENCOUNTER — Encounter: Admit: 2023-07-25 | Discharge: 2023-07-25 | Payer: Private Health Insurance - Indemnity

## 2023-07-26 ENCOUNTER — Encounter: Admit: 2023-07-26 | Discharge: 2023-07-26 | Payer: Private Health Insurance - Indemnity

## 2023-08-03 ENCOUNTER — Ambulatory Visit: Admit: 2023-08-03 | Discharge: 2023-08-04 | Payer: Private Health Insurance - Indemnity

## 2023-08-03 ENCOUNTER — Encounter: Admit: 2023-08-03 | Discharge: 2023-08-03 | Payer: Private Health Insurance - Indemnity

## 2023-08-05 ENCOUNTER — Encounter: Admit: 2023-08-05 | Discharge: 2023-08-05 | Payer: Private Health Insurance - Indemnity

## 2023-08-09 ENCOUNTER — Encounter: Admit: 2023-08-09 | Discharge: 2023-08-09 | Payer: Private Health Insurance - Indemnity

## 2023-08-10 ENCOUNTER — Encounter: Admit: 2023-08-10 | Discharge: 2023-08-10 | Payer: Private Health Insurance - Indemnity

## 2023-08-10 ENCOUNTER — Ambulatory Visit: Admit: 2023-08-10 | Discharge: 2023-08-10 | Payer: Private Health Insurance - Indemnity

## 2023-08-10 MED ORDER — FENTANYL CITRATE (PF) 50 MCG/ML IJ SOLN
INTRAVENOUS | 0 refills | Status: DC
Start: 2023-08-10 — End: 2023-08-10

## 2023-08-10 MED ORDER — LIDOCAINE (PF) 20 MG/ML (2 %) IJ SOLN
INTRAVENOUS | 0 refills | Status: DC
Start: 2023-08-10 — End: 2023-08-10

## 2023-08-10 MED ORDER — PROPOFOL INJ 10 MG/ML IV VIAL
INTRAVENOUS | 0 refills | Status: DC
Start: 2023-08-10 — End: 2023-08-10

## 2023-08-10 MED ORDER — PROPOFOL 10 MG/ML IV EMUL 20 ML (INFUSION)(AM)(OR)
INTRAVENOUS | 0 refills | Status: DC
Start: 2023-08-10 — End: 2023-08-10
  Administered 2023-08-10: 17:00:00 150 ug/kg/min via INTRAVENOUS

## 2023-08-10 NOTE — Anesthesia Post-Procedure Evaluation
 Post-Anesthesia Evaluation    Name: Alan [name]      MRN: [id number]     DOB: [birth year]     Age: 43 y.o.     Sex: male   __________________________________________________________________________     Procedure Information       Anesthesia Start Date/Time: 08/10/23 1104    Procedures:       ESOPHAGEAL BALLOON DISTENSION STUDY WITH/ WITHOUT PROVOCATION - DIAGNOSTIC      ESOPHAGOGASTRODUODENOSCOPY, WITH BALLOON DILATION OF LESS THAN 30 MILLIMETERS      ESOPHAGOGASTRODUODENOSCOPY WITH BIOPSY - FLEXIBLE    Location: ENDO 5 / ENDO/GI    Surgeons: Jolee Ewing, MD            Post-Anesthesia Vitals  BP: 98/85 (02/11 1200)  Temp: 36.7 ?C (98.1 ?F) (02/11 1141)  Pulse: 93 (02/11 1200)  Respirations: 19 PER MINUTE (02/11 1200)  SpO2: 94 % (02/11 1200)  O2 Device: None (Room air) (02/11 1200)   Vitals Value Taken Time   BP 98/85 08/10/23 1200   Temp 36.7 ?C (98.1 ?F) 08/10/23 1141   Pulse 93 08/10/23 1200   Respirations 19 PER MINUTE 08/10/23 1200   SpO2 94 % 08/10/23 1200   O2 Device None (Room air) 08/10/23 1200   ABP     ART BP           Post Anesthesia Evaluation Note    Evaluation location: Pre/Post  Patient participation: recovered; patient participated in evaluation  Level of consciousness: alert    Pain score: 0  Pain management: adequate    Hydration: normovolemia  Temperature: 36.0?C - 38.4?C  Airway patency: adequate    Perioperative Events       Post-op nausea and vomiting: no PONV    Postoperative Status  Cardiovascular status: hemodynamically stable  Respiratory status: spontaneous ventilation  Follow-up needed: none        Perioperative Events  There were no known complications for this encounter.

## 2023-08-10 NOTE — Anesthesia Pre-Procedure Evaluation
 Anesthesia Pre-Procedure Evaluation    Name: Alan [name]      MRN: [id number]     DOB: 26-Feb-1981     Age: 43 y.o.     Sex: male   _________________________________________________________________________     Procedure Info:   Procedure Information       Date/Time: 08/10/23 1054    Procedures:       ESOPHAGEAL BALLOON DISTENSION STUDY WITH/ WITHOUT PROVOCATION - DIAGNOSTIC      ESOPHAGOGASTRODUODENOSCOPY, WITH BALLOON DILATION OF LESS THAN 30 MILLIMETERS    Location: ENDO 5 / ENDO/GI    Surgeons: Jolee Ewing, MD          Physical Assessment  Vital Signs (last filed in past 24 hours):         Patient History   Allergies   Allergen Reactions    Iodinated Contrast Media FLUSHING (SKIN) and REDNESS    Iodine And Iodide Containing Products RASH    Azathioprine RASH    Chlorhexidine RASH    Amoxicillin-Pot Clavulanate UNKNOWN    Chlorphen-Phenyleph-Hydrocodon ITCHING    Gadolinium-Containing Contrast Media FLUSHING (SKIN)     Pt reported has had reaction in past x2 at other location. Pt reports was given benadryl with 2nd reaction.       Oxycodone ITCHING        Current Medications    Medication Directions   ALPRAZolam (XANAX) 1 mg tablet Take one tablet by mouth as Needed.   chlordiazePOXIDE-clidinium (LIBRAX (WITH CLIDINIUM)) 2.5/5 mg capsule TAKE 1 CAPSULE BY MOUTH THREE TIMES A DAY   cholestyramine-aspartame (QUESTRAN LIGHT) 4 gram packet Take two packets by mouth twice daily (at 10AM and 10PM). Mix with at least 4 oz water or non-carbonated liquid; take other medications 1 hour before or 4 hours after cholestyramine powder  Indications: itching associated with blockage of a bile duct   dapagliflozin propanediol (FARXIGA) 10 mg tablet Take one tablet by mouth every morning.   dicyclomine (BENTYL) 20 mg tablet TAKE ONE TABLET BY MOUTH FOUR TIMES DAILY AS NEEDED   diphenoxylate-atropine (LOMOTIL) 2.5-0.025 mg tablet TAKE 1 TABLET BY MOUTH FOUR TIMES A DAY AS NEEDED FOR DIARRHEA   ERGOcalciferoL (vitamin D2) (DRISDOL) 1,250 mcg (50,000 unit) capsule TAKE 1 CAPSULE BY MOUTH EVERY 7 DAYS FOR VITAMIN D DEFICIENCY   eszopiclone (LUNESTA) 3 mg tablet Take one tablet by mouth at bedtime daily.   famotidine (PEPCID) 20 mg tablet Take one tablet by mouth as Needed.   HUMALOG KWIKPEN INSULIN 100 unit/mL subcutaneous PEN Inject four units under the skin three times daily with meals. Correct for blood sugar over 220. BG 221-260: give 2 units. BG 261-300 give 4 units. BG 301-350: give 6 units. 351-400: 8 units. >400: 10 units   L-Methylfolate (DEPLIN 15) 15 mg tablet Take one tablet by mouth daily.   levothyroxine (SYNTHROID) 25 mcg tablet Take one tablet by mouth daily 30 minutes before breakfast.   lipase-protease-amylase (PANCREAZE) 37,000-97,300- 149,900 unit capsule Take one capsule by mouth three times daily. Please use these instructions: Take two capsules WITH each meal, and one capsule with each snack that contains fat or protein. May have three snacks daily.   NIFEDIPINE/LIDOCAINE 0.3%/1.5% IN PETROLATUM OINTMENT (BATCHED COMPOUND) Apply to anal area three times daily as needed   nortriptyline (PAMELOR) 50 mg capsule Take one capsule by mouth at bedtime daily.   ondansetron (ZOFRAN ODT) 4 mg rapid dissolve tablet Dissolve one tablet by mouth every 6 hours as needed. Place on tongue to  dissolve.  Indications: nausea   pantoprazole DR (PROTONIX) 40 mg tablet TAKE 1 TABLET BY MOUTH TWICE A DAY   pramoxine-hydrocortisone (ANALPRAM-HC) 1-1 % rectal cream Insert or Apply one g to rectal area as directed twice daily.   rosuvastatin (CRESTOR) 40 mg tablet Take one tablet by mouth twice weekly. Hold until follow up with hepatology  Indications: excessive fat in the blood   TRESIBA FLEXTOUCH U-200 200 unit/mL (3 mL) injection PEN thirty Units twice daily. Plus a Sliding Scale.   ursodioL (URSO 250) 250 mg tablet Take two tablets by mouth twice daily with meals. 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   VASCEPA 1 gram capsule TAKE 2 CAPSULES BY MOUTH TWICE A DAY       Review of Systems/Medical History      Patient summary reviewed  Pertinent labs reviewed      No history of anesthetic complications    No family history of anesthetic complications      Airway - negative        Pulmonary       Current smoker (1 ppd); patient did not smoke on day of surgery          tobacco use        Cardiovascular       Recent diagnostic studies:          echocardiogram      Exercise tolerance: >4 METS      Beta Blocker therapy: No      Beta blockers within 24 hours: n/a      Hypertension, well controlled          No past MI        No PTCA          Hyperlipidemia      05/2023 TTE  ? LVEF=60-65%.  ? Normal left ventricular size and function.  ? No regional wall motion abnormalities.  ? Right ventricular size and function are normal.  ? Normal LA volume index.  Normal RA area.   ? The aortic and pulmonic valves were not well visualized.  ? No significant valvular stenosis or regurgitation by Doppler studies.  ? Trivial pericardial effusion without hemodynamic compromise.   ? Normal central venous pressure (0-5 mm Hg).  ? Unable to accurately estimate PA systolic pressure.      There are no previous studies available for comparison.        GI/Hepatic/Renal       Inflammatory bowel disease (Crohn's)          GERD        Liver disease (c/f  drug-induced liver injury especially from Augmentin per hepatology):        Pancreatitis (chronic)       No renal disease:           Elevated AST, ALT- improving  Elevated T bili, alk phos- increasing  Pruritus likely 2/2 hyperbilirubinemia        Neuro/Psych       No seizures      Headaches        Psychiatric history (Panic Attacks)          Depression          Anxiety      Syncope x 2 times. Full workup by cardiology and neurology came back normal, per patient.  Endocrine/Other       Diabetes, type 2      Most recent Hgb A1C:> 9        Hypothyroidism           Physical Exam    Airway Findings      Mallampati: II      TM distance: >3 FB      Neck ROM: full      Mouth opening: good      Airway patency: adequate    Dental Findings: Negative      Cardiovascular Findings:       Rhythm: regular      Rate: normal    Pulmonary Findings:       Breath sounds clear to auscultation.    Neurological Findings:       Alert and oriented x 3    Constitutional findings:       No acute distress      Well-developed      Well-nourished       Previous Airway Procedure Notes Displaying the 3 most recent records      Date Mask Difficulty Techninque used for succesful ETT placement Blade Type Blade Size View with successful placement Number of attempts    06/02/23 0 - not attempted video laryngoscopy Glide Scope 4 grade I - full view of glottis 3 or more            Patient Lines/Drains/Airways Status       Active Lines:       None                  Diagnostic Tests  Hematology:   Lab Results   Component Value Date    HGB 13.6 06/17/2023    HGB 16.8 01/08/2023    HCT 39.2 06/17/2023    HCT 48.5 01/08/2023    PLTCT 504 06/17/2023    PLTCT 330 01/08/2023    WBC 10.50 06/17/2023    WBC 17.94 01/08/2023    NEUT 42 06/17/2023    NEUT 69.7 01/08/2023    ANC 4.40 06/17/2023    ANC 8.0 06/15/2023    ANC 12.50 01/08/2023    LYMPH 23 06/15/2023    ALC 3.50 06/17/2023    ALC 3.74 01/08/2023    MONA 8 06/17/2023    MONA 7.2 01/08/2023    AMC 0.90 06/17/2023    AMC 1.30 01/08/2023    EOSA 14 06/17/2023    EOSA 4 06/03/2021    ABC 0.30 06/17/2023    ABC 0.10 01/08/2023    BASOPHILS 1 06/15/2023    MCV 87.5 06/17/2023    MCV 86.3 01/08/2023    MCH 30.4 06/17/2023    MCH 29.9 01/08/2023    MCHC 34.8 06/17/2023    MCHC 34.6 01/08/2023    MPV 7.7 06/17/2023    MPV 8.6 01/08/2023    RDW 16.9 06/17/2023    RDW 14.7 01/08/2023         General Chemistry:   Lab Results   Component Value Date    NA 138 07/22/2023    K 4.1 07/22/2023    CL 103 07/22/2023    CO2 22.0 07/22/2023    GAP 13 07/22/2023    BUN 12.3 07/22/2023    CR 0.98 07/22/2023    GLU 298 07/22/2023    CA 10.1 07/22/2023    ALBUMIN 3.8 07/22/2023    MG 1.9 12/17/2020    TOTBILI 0.74 07/22/2023  Coagulation:   Lab Results   Component Value Date    PT 11.4 06/14/2023    PT 10.5 12/17/2020    PTT 31.4 12/17/2020    INR 1.0 06/14/2023    INR 0.9 12/17/2020       PAC Plan    Anesthesia Plan    ASA score: 3   Plan: MAC  NPO status: acceptable      Informed Consent  Anesthetic plan and risks discussed with patient.        Plan discussed with: anesthesiologist and CRNA.      Alerts: No

## 2023-08-11 ENCOUNTER — Encounter: Admit: 2023-08-11 | Discharge: 2023-08-11 | Payer: Private Health Insurance - Indemnity

## 2023-08-12 ENCOUNTER — Encounter: Admit: 2023-08-12 | Discharge: 2023-08-12 | Payer: Private Health Insurance - Indemnity

## 2023-08-13 ENCOUNTER — Encounter: Admit: 2023-08-13 | Discharge: 2023-08-13 | Payer: Private Health Insurance - Indemnity

## 2023-08-20 ENCOUNTER — Encounter: Admit: 2023-08-20 | Discharge: 2023-08-20 | Payer: Private Health Insurance - Indemnity

## 2023-08-23 ENCOUNTER — Encounter: Admit: 2023-08-23 | Discharge: 2023-08-23 | Payer: Private Health Insurance - Indemnity

## 2023-08-23 ENCOUNTER — Ambulatory Visit: Admit: 2023-08-23 | Discharge: 2023-08-24 | Payer: Private Health Insurance - Indemnity

## 2023-08-24 ENCOUNTER — Encounter: Admit: 2023-08-24 | Discharge: 2023-08-24 | Payer: Private Health Insurance - Indemnity

## 2023-08-31 ENCOUNTER — Encounter: Admit: 2023-08-31 | Discharge: 2023-08-31 | Payer: Private Health Insurance - Indemnity

## 2023-09-01 ENCOUNTER — Encounter: Admit: 2023-09-01 | Discharge: 2023-09-01 | Payer: Private Health Insurance - Indemnity

## 2023-09-16 ENCOUNTER — Ambulatory Visit: Admit: 2023-09-16 | Discharge: 2023-09-17 | Payer: Private Health Insurance - Indemnity

## 2023-09-16 ENCOUNTER — Encounter: Admit: 2023-09-16 | Discharge: 2023-09-16 | Payer: Private Health Insurance - Indemnity

## 2023-09-16 NOTE — Progress Notes
 Clinical Nutrition Assessment Summary    Alan [name] is a 43 y.o. male with referral for gastroparesis    Past Medical History: reviewed; Crohns, DM2    Current Treatment Plan: low residue, low fat, PERT     Nutrition Assessment of Patient:  Wt Readings from Last 5 Encounters:   08/10/23 89.8 kg (198 lb)   06/09/23 89.8 kg (197 lb 15.6 oz)   05/12/23 95.3 kg (210 lb)   02/17/23 91.2 kg (201 lb)   09/11/22 95.1 kg (209 lb 10.5 oz)      Malnutrition Details:  Does not meet criteria    Pertinent Labs: reviewed   Pertinent Meds/Supplements: cholestyramine, bentyl, lomotil, D2, pepcid, insulin, synthroid, linzess, pancreaze, protonix, stelara  Pertinent Food Allergies/Intolerance: NKFA    RD s/w patient via telephone d/t last minute provider conflict. Patient is having issues with both crohns and GP symptoms. Patient states that he vomits all of the time, constantly feels bloated and nauseous. He states that this has been going on for 8 years. Patient also states that he goes back and forth between constipation and diarrhea, will occasionally have formed stools. He states that he has tried many different diet, and they haven't seemed to change symptoms. He states that his stools are always yellow and oily. He currently is eating a regular diet. He is taking 1 tablet of pancreaze when he remembers. He is opening to trying KF shakes. He is struggling with work, and trying to get disability.     Intervention/Plan:  Discussed need for pancreaze at all meals and snacks - going to increase up to 3 with meals and 1-2 with snacks, depending on size of meal. Instructed him to take this with first bite of food.  RD to order [employer] to patients home to try.  RD to connect patient with GI SW.  We discussed a low residue low fat diet, shared diet resources via MyChart   Encouraged 6 small meals per day   Encouraged limiting [employer]     Nutrition Monitoring and Evaluation:  Goal:  Time Frame:    The patient was allowed to ask questions and actively participated in creating plan of care. I provided the patient with my contact information and instructed pt on how to get in touch with me if questions or concerns arise. Thank you for allowing nutrition services to participate in this patients care.     Follow up Date: 6 weeks     Richmond Campbell MS, RD, CNSC  Ambulatory Float Dietitian  Department of Clinical Nutrition

## 2023-09-17 ENCOUNTER — Encounter: Admit: 2023-09-17 | Discharge: 2023-09-17 | Payer: Private Health Insurance - Indemnity

## 2023-09-17 DIAGNOSIS — K3184 Gastroparesis: Secondary | ICD-10-CM

## 2023-09-17 NOTE — Progress Notes
 Continuum of Care Case Management Note            GASTROENTEROLOGY  Plan:       Interventions:  SW received referral from [facility] regarding SSDI. SW contacted pt, left message and requested a returned call to discuss.     Tonna Corner, LSCSW

## 2023-09-20 ENCOUNTER — Encounter: Admit: 2023-09-20 | Discharge: 2023-09-20 | Payer: Private Health Insurance - Indemnity

## 2023-09-20 NOTE — Progress Notes
 Continuum of Care Case Management Note            GASTROENTEROLOGY  Plan:       Interventions:  SW received referral from [facility] regarding SSDI. SW contacted pt to discuss. Pt communicated that he has applied for SSDI twice and been denied twice. Both times his case was presented to a judge who denied his SSDI request. Inquired about appeal. Pt stated he did not appeal because he was upset at the time and did not want to proceed.     Pt lives in [place], [place] with his spouse. His spouse is employed full time and pt does side jobs to help with income. They have a 29 year old son who is studying to become a Engineer, maintenance (IT).     SW engaged in reflective listening, motivational interviewing, and provided emotional support regarding pt's stressors, including inability to obtain SSDI, financial stressors, marital concerns, and other events occurring in his life creating stress. Pt aware of the gut and stress connection. Discussed therapeutic supports and Crohn's and Colitis support group.     Tonna Corner, LSCSW

## 2023-09-20 NOTE — Progress Notes
 Encounter entered in erroro    [employer], [place]

## 2023-09-28 ENCOUNTER — Encounter: Admit: 2023-09-28 | Discharge: 2023-09-28

## 2023-09-28 NOTE — Progress Notes
 Specialty Medication Reassessment Attempt    Medication name: Alan [name]    Contacted Alan [name] to verify compliance and assess tolerance of their specialty medication.    Left voicemail asking patient to return call to the pharmacist at [phone].    Mliss Fritz, PHARMD

## 2023-09-29 ENCOUNTER — Encounter: Admit: 2023-09-29 | Discharge: 2023-09-29

## 2023-09-30 ENCOUNTER — Encounter: Admit: 2023-09-30 | Discharge: 2023-09-30

## 2023-09-30 DIAGNOSIS — K5000 Crohn's disease of small intestine without complications: Secondary | ICD-10-CM

## 2023-09-30 DIAGNOSIS — K3184 Gastroparesis: Secondary | ICD-10-CM

## 2023-10-01 ENCOUNTER — Encounter: Admit: 2023-10-01 | Discharge: 2023-10-01

## 2023-10-01 DIAGNOSIS — D72829 Elevated white blood cell count, unspecified: Secondary | ICD-10-CM

## 2023-10-06 ENCOUNTER — Encounter: Admit: 2023-10-06 | Discharge: 2023-10-06

## 2023-10-06 DIAGNOSIS — D72829 Elevated white blood cell count, unspecified: Secondary | ICD-10-CM

## 2023-10-06 MED ORDER — METHYLPREDNISOLONE 32 MG PO TAB
32 mg | ORAL_TABLET | ORAL | 0 refills | Status: AC
Start: 2023-10-06 — End: ?

## 2023-10-06 NOTE — Progress Notes
 Name: Alan [name]          MRN: [id number]      DOB: March 24, 1981      AGE: 43 y.o.   DATE OF SERVICE: 10/06/2023    Subjective:             Reason for Visit:  Heme/Onc Care      Alan [name] is a 43 y.o. male who presents to my hematology clinic for follow-up for his leukocytosis.      History of Present Illness  Hematology history:  Leukocytosis  Referring Provider:  Buckles, Vinnie Level, MD      Contact Summary:  Patient is a 43 year old referred for leukocytosis. Patient saw GI who requested labs be obtained. Patient mentioned history of polcythemia and was seen at [facility]. Other PMH and labs below   Component  Ref Range & Units 01/08/23 08/20/22 12/16/21 1320 06/03/21 2326 12/17/20 2104 12/12/20   White Blood Cells 17.94 High  11.50 High  12.2 High  R 12.2 High  R 13.3 High  R 14.0 High    RBC 5.62 5.51 5.48 R 5.42 R 5.09 R 5.38   Hemoglobin 16.8 16.2 15.9 R 16.5 R 15.0 R 16.5   Hematocrit 48.5 46.8 47.1 R 47.9 R 43.8 R 49.6   MCV 86.3 84.9 86.1 R 88.5 R 86.2 R 92.0   MCH 29.9 29.4 29.1 R 30.4 R 29.5 R 30.7   MCHC 34.6 34.6 33.8 R 34.4 R 34.2 R 33.3   Platelet Count 330 265 256 R 250 R 272 R 290   MPV 8.6 Low  8.9 Low  7.2 R 7.1 R 7.3 R     RDW 14.7 High  14.7 High  15.0 R 14.9 R 15.5 High  R 14.4   Neutrophils 69.7     62 R 64 R     Absolute Neutrophil Count 12.50 High      7.50 High  R 8.50 High  R     Lymphocytes 20.8     24 R 26 R     Absolute Lymph Count 3.74     2.93 R 3.41 R     Monocytes 7.2     9 R 6 R     Absolute Monocyte Count 1.30 High      1.15 High  R 0.77 R     Eosinophil 1.1             Absolute Eosinophil Count 0.19     0.49 High  R 0.59 High  R     Basophils 0.6     1 R 0 R     Absolute Basophil Count 0.10 High      0.09 R 0.03 R     Atypical Lym               Metamyelocyte               Myelocyte               Promyelocyte               Blast               RBC Morph               WBC Morphology               Eosinophils       4 R 4 R     MDW (  Monocyte Distrib             Labs from February 26, 2023:  Sed rate was 12 peripheral smear showed mild leukocytosis with absolute neutrophilia and absolute eosinophilia and absolute basophilia with red blood cells normal in number and morphology and platelets normal in number with rare giant forms, white blood cell count was 12.81, hemoglobin is 16.9 with a hematocrit of 49.1, platelet count was 283,000, the P1 90 and P2 10 BCR-ABL fusion transcripts were not detected, flow cytometry showed no immunophenotypic abnormalities detected    Lab Only on 10/06/2023   Component Date Value Ref Range Status    White Blood Cells 10/06/2023 17.50 (H)  4.50 - 11.00 10*3/uL Final    Red Blood Cells 10/06/2023 5.58 (H)  4.40 - 5.50 10*6/uL Final    Hemoglobin 10/06/2023 16.2  13.5 - 16.5 g/dL Final    Hematocrit 16/03/9603 48.3  40.0 - 50.0 % Final    MCV 10/06/2023 86.6  80.0 - 100.0 fL Final    MCH 10/06/2023 29.0  26.0 - 34.0 pg Final    MCHC 10/06/2023 33.5  32.0 - 36.0 g/dL Final    RDW 54/02/8118 15.2 (H)  11.0 - 15.0 % Final    Platelet Count 10/06/2023 309  150 - 400 10*3/uL Final    MPV 10/06/2023 7.0  7.0 - 11.0 fL Final    Neutrophils 10/06/2023 66.8  41.0 - 77.0 % Final    Lymphocytes 10/06/2023 22.7 (L)  24.0 - 44.0 % Final    Monocytes 10/06/2023 4.9  4.0 - 12.0 % Final    Eosinophils 10/06/2023 4.7  0.0 - 5.0 % Final    Basophils 10/06/2023 0.9  0.0 - 2.0 % Final    Absolute Neutrophil Count 10/06/2023 11.70 (H)  1.80 - 7.00 10*3/uL Final    Absolute Lymph Count 10/06/2023 4.00  1.00 - 4.80 10*3/uL Final    Absolute Monocyte Count 10/06/2023 0.90 (H)  0.00 - 0.80 10*3/uL Final    Absolute Eosinophil Count 10/06/2023 0.80 (H)  0.00 - 0.45 10*3/uL Final    Absolute Basophil Count 10/06/2023 0.20  0.00 - 0.20 10*3/uL Final              Review of Systems   Constitutional:  Positive for chills, diaphoresis (for years) and fatigue. Negative for fever.   HENT:  Negative for congestion.    Eyes:  Negative for visual disturbance.   Respiratory:  Positive for shortness of breath (with exertion). Negative for cough.    Cardiovascular:  Positive for chest pain.   Gastrointestinal:  Positive for constipation and diarrhea.   Genitourinary:  Positive for frequency.   Musculoskeletal:  Positive for myalgias (fibromyalgia). Negative for arthralgias and back pain.   Skin:  Negative for rash.   Neurological:  Positive for headaches (occ).   Hematological:  Negative for adenopathy. Bruises/bleeds easily.   Psychiatric/Behavioral:  The patient is nervous/anxious.          Objective:          ALPRAZolam (XANAX) 1 mg tablet Take one tablet by mouth as Needed.    chlordiazePOXIDE-clidinium (LIBRAX (WITH CLIDINIUM)) 2.5/5 mg capsule TAKE 1 CAPSULE BY MOUTH THREE TIMES A DAY    cholestyramine-aspartame (QUESTRAN LIGHT) 4 gram packet Take two packets by mouth twice daily (at 10AM and 10PM). Mix with at least 4 oz water or non-carbonated liquid; take other medications 1 hour before or 4 hours after cholestyramine powder  Indications: itching associated with blockage  of a bile duct    dapagliflozin propanediol (FARXIGA) 10 mg tablet Take one tablet by mouth every morning.    dicyclomine (BENTYL) 20 mg tablet TAKE ONE TABLET BY MOUTH FOUR TIMES DAILY AS NEEDED    diphenoxylate-atropine (LOMOTIL) 2.5-0.025 mg tablet TAKE 1 TABLET BY MOUTH FOUR TIMES A DAY AS NEEDED FOR DIARRHEA    ERGOcalciferoL (vitamin D2) (DRISDOL) 1,250 mcg (50,000 unit) capsule TAKE 1 CAPSULE BY MOUTH EVERY 7 DAYS FOR VITAMIN D DEFICIENCY    eszopiclone (LUNESTA) 3 mg tablet Take one tablet by mouth at bedtime daily.    famotidine (PEPCID) 20 mg tablet Take one tablet by mouth as Needed.    HUMALOG KWIKPEN INSULIN 100 unit/mL subcutaneous PEN Inject four units under the skin three times daily with meals. Correct for blood sugar over 220. BG 221-260: give 2 units. BG 261-300 give 4 units. BG 301-350: give 6 units. 351-400: 8 units. >400: 10 units    L-Methylfolate (DEPLIN 15) 15 mg tablet Take one tablet by mouth daily. levothyroxine (SYNTHROID) 25 mcg tablet Take one tablet by mouth daily 30 minutes before breakfast.    linaCLOtide (LINZESS) 145 mcg capsule Take one capsule by mouth daily 30 minutes before breakfast.    lipase-protease-amylase (PANCREAZE) 37,000-97,300- 149,900 unit capsule Take one capsule by mouth three times daily. Please use these instructions: Take two capsules WITH each meal, and one capsule with each snack that contains fat or protein. May have three snacks daily.    NIFEDIPINE/LIDOCAINE 0.3%/1.5% IN PETROLATUM OINTMENT (BATCHED COMPOUND) Apply to anal area three times daily as needed    nortriptyline (PAMELOR) 50 mg capsule Take one capsule by mouth at bedtime daily.    ondansetron (ZOFRAN ODT) 4 mg rapid dissolve tablet Dissolve one tablet by mouth every 6 hours as needed. Place on tongue to dissolve.  Indications: nausea    pantoprazole DR (PROTONIX) 40 mg tablet TAKE 1 TABLET BY MOUTH TWICE A DAY    pramoxine-hydrocortisone (ANALPRAM-HC) 1-1 % rectal cream Insert or Apply one g to rectal area as directed twice daily.    rosuvastatin (CRESTOR) 40 mg tablet Take one tablet by mouth twice weekly. Hold until follow up with hepatology  Indications: excessive fat in the blood    TRESIBA FLEXTOUCH U-200 200 unit/mL (3 mL) injection PEN thirty Units twice daily. Plus a Sliding Scale.    ursodioL (URSO 250) 250 mg tablet Take two tablets by mouth twice daily with meals. 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    VASCEPA 1 gram capsule TAKE 2 CAPSULES BY MOUTH TWICE A DAY     Vitals:    10/06/23 1120   BP: 112/82   BP Source: Arm, Left Upper   Pulse: 94   Temp: 36.4 ?C (97.5 ?F)   Resp: 20   SpO2: 99%   TempSrc: Temporal   PainSc: Five   Weight: 90.7 kg (200 lb)   Height: 175.3 cm (5' 9.02)       Body mass index is 29.52 kg/m?Marland Kitchen     Pain Score: Five  Pain Loc: Generalized    Fatigue Scale: 8    Pain Addressed:  N/A    Patient Evaluated for a Clinical Trial: No treatment clinical trial available for this patient.     [place] Cooperative Oncology Group performance status is 0, Fully active, able to carry on all pre-disease performance without restriction.Marland Kitchen  Physical Exam  Constitutional:       Appearance: He is well-developed.   HENT:      Head: Normocephalic and atraumatic.   Eyes:      Pupils: Pupils are equal, round, and reactive to light.   Cardiovascular:      Rate and Rhythm: Normal rate and regular rhythm.   Pulmonary:      Effort: Pulmonary effort is normal.      Breath sounds: Normal breath sounds. No wheezing.   Abdominal:      General: Bowel sounds are normal.      Palpations: Abdomen is soft. There is no mass.   Musculoskeletal:      Cervical back: Neck supple.      Right lower leg: Edema present.      Left lower leg: Edema present.   Lymphadenopathy:      Cervical: No cervical adenopathy.      Upper Body:      Right upper body: No supraclavicular or axillary adenopathy.      Left upper body: No supraclavicular or axillary adenopathy.   Skin:     Findings: No rash.   Neurological:      Mental Status: He is alert and oriented to person, place, and time.      Cranial Nerves: No cranial nerve deficit.               Assessment and Plan:  1. Leukocytosis-his sed rate was 12, peripheral smear showed mild leukocytosis with absolute neutrophilia and absolute eosinophilia and absolute basophilia with red cells normal number morphology and platelets normal in number with rare giant forms, white blood cell count was 12.81, hemoglobin 16.9 with a platelet count of 283,000 his P1 90 and P2 10 BCR-ABL were not detected and his flow cytometry was negative.his white blood cell count was 17.5, hemoglobin was 16.2 with a hematocrit of 48.3, platelet count 390,000, ANC was 11.7, monocyte count 0.9 and eosinophil count 0.8 on October 06, 2023.I will check a bone marrow biopsy soon. I will check a CT chest, abdomen, and pelvis soon.  2. Return to clinic- I will have the patient return to clinic in three weeks.    I answered the patient's questions to his satisfaction.  I spent 40 minutes of this visit in which at least 50% of it was face to face consultation with the patient.

## 2023-10-07 ENCOUNTER — Encounter: Admit: 2023-10-07 | Discharge: 2023-10-07

## 2023-10-08 ENCOUNTER — Encounter: Admit: 2023-10-08 | Discharge: 2023-10-08 | Payer: PRIVATE HEALTH INSURANCE

## 2023-10-11 ENCOUNTER — Encounter: Admit: 2023-10-11 | Discharge: 2023-10-11 | Payer: PRIVATE HEALTH INSURANCE

## 2023-10-11 ENCOUNTER — Ambulatory Visit: Admit: 2023-10-11 | Discharge: 2023-10-11 | Payer: PRIVATE HEALTH INSURANCE

## 2023-10-11 ENCOUNTER — Ambulatory Visit: Admit: 2023-10-11 | Discharge: 2023-10-11

## 2023-10-11 MED ORDER — LIDOCAINE (PF) 20 MG/ML (2 %) IJ SOLN
INTRAVENOUS | 0 refills | Status: DC
Start: 2023-10-11 — End: 2023-10-11

## 2023-10-11 MED ORDER — PROPOFOL INJ 10 MG/ML IV VIAL
INTRAVENOUS | 0 refills | Status: DC
Start: 2023-10-11 — End: 2023-10-11

## 2023-10-11 MED ORDER — PROPOFOL 10 MG/ML IV EMUL 20 ML (INFUSION)(AM)(OR)
INTRAVENOUS | 0 refills | Status: DC
Start: 2023-10-11 — End: 2023-10-11
  Administered 2023-10-11: 20:00:00 120 ug/kg/min via INTRAVENOUS

## 2023-10-11 MED ORDER — LACTATED RINGERS IV SOLP
INTRAVENOUS | 0 refills | Status: DC
Start: 2023-10-11 — End: 2023-10-11

## 2023-10-11 MED ORDER — INSULIN REGULAR HUMAN(#) 1 UNIT/ML IJ SYRINGE
SUBCUTANEOUS | 0 refills | Status: DC
Start: 2023-10-11 — End: 2023-10-11

## 2023-10-11 MED ORDER — FENTANYL CITRATE (PF) 50 MCG/ML IJ SOLN
INTRAVENOUS | 0 refills | Status: DC
Start: 2023-10-11 — End: 2023-10-11

## 2023-10-11 MED ORDER — ONDANSETRON HCL (PF) 4 MG/2 ML IJ SOLN
INTRAVENOUS | 0 refills | Status: DC
Start: 2023-10-11 — End: 2023-10-11

## 2023-10-11 NOTE — Anesthesia Post-Procedure Evaluation
 Post-Anesthesia Evaluation    Name: Alan [name]      MRN: [id number]     DOB: Feb 14, 1981     Age: 43 y.o.     Sex: male   __________________________________________________________________________     Procedure Information       Anesthesia Start Date/Time: 10/11/23 1520    Procedures:       ESOPHAGOGASTRODUODENOSCOPY WITH BIOPSY - FLEXIBLE      ESOPHAGOGASTRODUODENOSCOPY WITH DILATION GASTRIC/ DUODENAL STRICTURE - FLEXIBLE    Location: ENDO 5 / ENDO/GI    Surgeons: Bucky Cardinal, MD            Post-Anesthesia Vitals      Vitals Value Taken Time   BP 134/118 10/11/23 1610   Temp 36.5 ?C (97.7 ?F) 10/11/23 1537   Pulse 82 10/11/23 1610   Respirations 16 PER MINUTE 10/11/23 1610   SpO2 97 % 10/11/23 1610   O2 Device None (Room air) 10/11/23 1555   ABP     ART BP           Post Anesthesia Evaluation Note    Evaluation location: Pre/Post  Patient participation: recovered; patient participated in evaluation  Level of consciousness: alert  Pain management: adequate    Hydration: normovolemia  Airway patency: adequate    Perioperative Events       Post-op nausea and vomiting: no PONV    Postoperative Status  Cardiovascular status: hemodynamically stable  Respiratory status: spontaneous ventilation  Follow-up needed: none  Additional comments: POCT BG 176 (after 2 units IV insulin  in preop holding)        Perioperative Events  There were no known complications for this encounter.

## 2023-10-13 ENCOUNTER — Encounter: Admit: 2023-10-13 | Discharge: 2023-10-13 | Payer: PRIVATE HEALTH INSURANCE

## 2023-10-13 ENCOUNTER — Ambulatory Visit: Admit: 2023-10-13 | Discharge: 2023-10-13 | Payer: PRIVATE HEALTH INSURANCE

## 2023-10-14 ENCOUNTER — Encounter: Admit: 2023-10-14 | Discharge: 2023-10-14 | Payer: PRIVATE HEALTH INSURANCE

## 2023-10-14 ENCOUNTER — Ambulatory Visit: Admit: 2023-10-14 | Discharge: 2023-10-14 | Payer: PRIVATE HEALTH INSURANCE

## 2023-10-14 MED ORDER — LACTATED RINGERS IV SOLP
INTRAVENOUS | 0 refills | Status: DC
Start: 2023-10-14 — End: 2023-10-14

## 2023-10-14 MED ORDER — FENTANYL CITRATE (PF) 50 MCG/ML IJ SOLN
INTRAVENOUS | 0 refills | Status: DC
Start: 2023-10-14 — End: 2023-10-14

## 2023-10-14 MED ORDER — LIDOCAINE (PF) 20 MG/ML (2 %) IJ SOLN
INTRAVENOUS | 0 refills | Status: DC
Start: 2023-10-14 — End: 2023-10-14

## 2023-10-14 MED ORDER — PROPOFOL 10 MG/ML IV EMUL 50 ML (INFUSION)(AM)(OR)
INTRAVENOUS | 0 refills | Status: DC
Start: 2023-10-14 — End: 2023-10-14

## 2023-10-14 MED ORDER — PROPOFOL 10 MG/ML IV EMUL 100 ML (INFUSION)(AM)(OR)
INTRAVENOUS | 0 refills | Status: DC
Start: 2023-10-14 — End: 2023-10-14
  Administered 2023-10-14: 19:00:00 100 ug/kg/min via INTRAVENOUS

## 2023-10-15 ENCOUNTER — Encounter: Admit: 2023-10-15 | Discharge: 2023-10-15 | Payer: PRIVATE HEALTH INSURANCE

## 2023-10-19 ENCOUNTER — Encounter: Admit: 2023-10-19 | Discharge: 2023-10-19 | Payer: PRIVATE HEALTH INSURANCE

## 2023-10-25 ENCOUNTER — Encounter: Admit: 2023-10-25 | Discharge: 2023-10-25 | Payer: PRIVATE HEALTH INSURANCE

## 2023-10-25 NOTE — Progress Notes
 Patient left voicemail stating that he received the cards for his new insurance going active 5/1. Will send Rx to [facility] on 5/1 to start PA process.     Insurance info:   ID: [id number]  RxBIN: [id number]  RxPCN: [id number]  Rx Group: Limmie Ren, PHARMD

## 2023-10-27 ENCOUNTER — Encounter: Admit: 2023-10-27 | Discharge: 2023-10-27 | Payer: PRIVATE HEALTH INSURANCE

## 2023-10-27 DIAGNOSIS — D72829 Elevated white blood cell count, unspecified: Secondary | ICD-10-CM

## 2023-10-27 LAB — COMPREHENSIVE METABOLIC PANEL

## 2023-10-28 ENCOUNTER — Ambulatory Visit: Admit: 2023-10-28 | Discharge: 2023-10-28 | Payer: PRIVATE HEALTH INSURANCE

## 2023-10-28 ENCOUNTER — Encounter: Admit: 2023-10-28 | Discharge: 2023-10-28 | Payer: PRIVATE HEALTH INSURANCE

## 2023-10-28 MED ORDER — STELARA 90 MG/ML SC SYRG
90 mg | SUBCUTANEOUS | 0 refills | 38.50000 days | Status: AC
Start: 2023-10-28 — End: ?

## 2023-10-29 ENCOUNTER — Encounter: Admit: 2023-10-29 | Discharge: 2023-10-29 | Payer: PRIVATE HEALTH INSURANCE

## 2023-10-29 ENCOUNTER — Ambulatory Visit: Admit: 2023-10-29 | Discharge: 2023-10-30 | Payer: PRIVATE HEALTH INSURANCE

## 2023-10-29 NOTE — Progress Notes
 Pharmacy Benefits Investigation    Medication name: ustekinumab (STELARA) 90 mg syringe  Medication status: continuation (refill)  Medication regimen: Inject 1 mL under the skin every 28 days.    The insurance requires a prior authorization for the medication. The prior authorization was submitted via CoverMyMeds. Will follow up in 2 business days.    PA number: Donnamaria Gable  Specialty Pharmacy Patient Advocate

## 2023-10-29 NOTE — Progress Notes
 RD called patient for follow up. LVM with call back information. RD remains available as needed.      Napolean Backbone MS, RD, CNSC, CDCES  Ambulatory Dietitian  Department of Clinical Nutrition

## 2023-11-01 ENCOUNTER — Encounter: Admit: 2023-11-01 | Discharge: 2023-11-01 | Payer: PRIVATE HEALTH INSURANCE

## 2023-11-01 DIAGNOSIS — D72829 Elevated white blood cell count, unspecified: Secondary | ICD-10-CM

## 2023-11-02 ENCOUNTER — Encounter: Admit: 2023-11-02 | Discharge: 2023-11-02 | Payer: PRIVATE HEALTH INSURANCE

## 2023-11-02 MED ORDER — STELARA 90 MG/ML SC SYRG
90 mg | SUBCUTANEOUS | 2 refills | 38.50000 days | Status: AC
Start: 2023-11-02 — End: ?

## 2023-11-02 NOTE — Progress Notes
 Pharmacy Benefits Investigation    Medication name: ustekinumab (STELARA) 90 mg syringe  Medication status: continuation (refill)  Medication regimen: Inject 1 mL under the skin every 28 days.    The prior authorization was approved for Alan [name] (PA number [id number]) through 10/28/2024.    The out of pocket cost is unknown because the medication cannot be filled at [facility]. The patient's insurance mandates they fill the medication at [facility] [place], [place] - [address].    Notified the clinic pharmacist for next steps.        Hurley Maid  Specialty Pharmacy Patient Advocate

## 2023-11-04 ENCOUNTER — Encounter: Admit: 2023-11-04 | Discharge: 2023-11-04 | Payer: PRIVATE HEALTH INSURANCE

## 2023-11-05 ENCOUNTER — Encounter: Admit: 2023-11-05 | Discharge: 2023-11-05 | Payer: PRIVATE HEALTH INSURANCE

## 2023-11-07 ENCOUNTER — Encounter: Admit: 2023-11-07 | Discharge: 2023-11-07 | Payer: PRIVATE HEALTH INSURANCE

## 2023-11-07 NOTE — Progress Notes
 Telehealth Visit Note    Date of Service: 11/08/2023    Obtained patient's, or patient proxy's, verbal consent to treat them and their agreement to [facility] financial policy and NPP via this telehealth visit during the [facility].      Subjective:            History of Present Illness         Alan [name] is a 43 y.o. male presenting to the hepatology clinic via telehealth alone today for continued management of elevated liver tests. Other history significant for Crohn's disease, diabetes, hypothyroidism, fibromyalgia, anxiety, chronic pancreatitis.     Briefly, patient was hospitalized from 06/02/23 through 06/17/23 after being transferred from outside hospital for elevated liver tests with AST 132 ALT 320 alk phos 325 total bili 5.1. Extensive serological hepatology work-up completed, overall unremarkable other than + AMA.     ERCP 06/02/23 without significant abnormalities. Abdominal US  showed resolved bile duct dilatation, mild hepatosplenomegaly.     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 Mild macrovesicular steatosis. Overall thought likely drug-induced-liver-injury secondary to recent Augmentin use, though PBC could not be ruled out with elevated alkaline phosphatase and + AMA. He was initiated on ursodiol and cholestyramine.         [facility]  Since seen via telehealth on 07/05/23, patient reports doing fair.      He underwent a bone marrow biopsy recently which he reports was negative.  He follows with [facility] for leukocytosis.     He reports chronic abdominal pain, no changes, he continues to follow with GI for this.Aaron Aas He reports nausea/vomtiing have resolved. He has some intermittent blood in his stools related to his Crohn's. He denies lower extremity edeam, jaundice.     He mostly notices feeling more weak and easily fatigued.     His liver tests have remained normal after discontinuign the urosdiol at his last visit. We will plan to continue monitoring his tests every 3 months.     He denies any alcohol consumption, he smokes 1 pack of cigarettes daily, denies other substance use.       Recent Labs/imaging:   Pertinent labs were reviewed on initiation of note and below:    Lab Results   Component Value Date    TOTPROT 7.2 08/19/2023    ALBUMIN 3.8 08/19/2023    TOTBILI 0.33 08/19/2023    ALKPHOS 101 08/19/2023    AST 18 08/19/2023    ALT 20 08/19/2023      Lab Results   Component Value Date    NA 130 08/19/2023    K 4.2 08/19/2023    BUN 17.3 08/19/2023    CR 1.00 08/19/2023      Lab Results   Component Value Date    WBC 15.50 (H) 10/14/2023    HGB 15.7 10/14/2023    PLTCT 257 10/14/2023    INR 1.0 06/14/2023        MELD 3.0: 16 at 06/16/2023  6:41 AM  MELD-Na: 16 at 06/16/2023  6:41 AM  Calculated from:  Serum Creatinine: 0.69 mg/dL (Using min of 1 mg/dL) at 16/03/9603  5:40 AM  Serum Sodium: 134 mmol/L at 06/16/2023  6:41 AM  Total Bilirubin: 6.3 mg/dL at 98/04/9146  8:29 AM  Serum Albumin: 3.6 g/dL (Using max of 3.5 g/dL) at 56/21/3086  5:78 AM  INR(ratio): 1 at 06/14/2023  6:21 AM  Age at listing (hypothetical): 42  years  Sex: Male at 06/16/2023  6:41 AM           Abdominal US  06/06/23 showed mild hepatomegaly, homogeneous appearance. No focal hepatic observations, no intrahepatic bile duct dilatation (resolved since 06/02/23), mild splenomegaly. No ascites.     CT abdomen/pelvis with contrast on 10/27/23 showed normal appearing liver .    Patient denies other complaints or concerns at this time.       Review of Systems  A complete 10 point review of systems was negative except those listed in the HPI.    Reviewed and updated active medication and allergy list.      ALPRAZolam (XANAX) 1 mg tablet Take one tablet by mouth as Needed.    chlordiazePOXIDE-clidinium (LIBRAX (WITH CLIDINIUM)) 2.5/5 mg capsule TAKE 1 CAPSULE BY MOUTH THREE TIMES A DAY    cholestyramine-aspartame (QUESTRAN LIGHT) 4 gram packet Take two packets by mouth twice daily (at 10AM and 10PM). Mix with at least 4 oz water or non-carbonated liquid; take other medications 1 hour before or 4 hours after cholestyramine powder  Indications: itching associated with blockage of a bile duct    dapagliflozin propanediol (FARXIGA) 10 mg tablet Take one tablet by mouth every morning.    dicyclomine (BENTYL) 20 mg tablet TAKE ONE TABLET BY MOUTH FOUR TIMES DAILY AS NEEDED    diphenoxylate-atropine (LOMOTIL) 2.5-0.025 mg tablet TAKE 1 TABLET BY MOUTH FOUR TIMES A DAY AS NEEDED FOR DIARRHEA    ERGOcalciferoL (vitamin D2) (DRISDOL) 1,250 mcg (50,000 unit) capsule TAKE 1 CAPSULE BY MOUTH EVERY 7 DAYS FOR VITAMIN D DEFICIENCY    eszopiclone (LUNESTA) 3 mg tablet Take one tablet by mouth at bedtime daily.    famotidine (PEPCID) 20 mg tablet Take one tablet by mouth as Needed.    HUMALOG KWIKPEN INSULIN 100 unit/mL subcutaneous PEN Inject four units under the skin three times daily with meals. Correct for blood sugar over 220. BG 221-260: give 2 units. BG 261-300 give 4 units. BG 301-350: give 6 units. 351-400: 8 units. >400: 10 units    L-Methylfolate (DEPLIN 15) 15 mg tablet Take one tablet by mouth daily.    levothyroxine (SYNTHROID) 25 mcg tablet Take one tablet by mouth daily 30 minutes before breakfast.    linaCLOtide (LINZESS) 145 mcg capsule Take one capsule by mouth daily 30 minutes before breakfast.    lipase-protease-amylase (PANCREAZE) 37,000-97,300- 149,900 unit capsule Take one capsule by mouth three times daily. Please use these instructions: Take two capsules WITH each meal, and one capsule with each snack that contains fat or protein. May have three snacks daily.    methylPREDNISolone (MEDROL) 32 mg tablet Take one tablet by mouth as directed. Take 32mg  by mouth 12 hours before appointment, then take 32mg  by mouth 2 hours before appointment time    NIFEDIPINE/LIDOCAINE 0.3%/1.5% IN PETROLATUM OINTMENT (BATCHED COMPOUND) Apply to anal area three times daily as needed    nortriptyline (PAMELOR) 50 mg capsule Take one capsule by mouth at bedtime daily.    ondansetron (ZOFRAN ODT) 4 mg rapid dissolve tablet Dissolve one tablet by mouth every 6 hours as needed. Place on tongue to dissolve.  Indications: nausea    pantoprazole DR (PROTONIX) 40 mg tablet TAKE 1 TABLET BY MOUTH TWICE A DAY    pramoxine-hydrocortisone (ANALPRAM-HC) 1-1 % rectal cream Insert or Apply one g to rectal area as directed twice daily.    rosuvastatin (CRESTOR) 40 mg tablet Take one tablet by mouth twice weekly. Hold until follow up  with hepatology  Indications: excessive fat in the blood    TRESIBA FLEXTOUCH U-200 200 unit/mL (3 mL) injection PEN thirty Units twice daily. Plus a Sliding Scale.    ursodioL (URSO 250) 250 mg tablet Take two tablets by mouth twice daily with meals. 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    VASCEPA 1 gram capsule TAKE 2 CAPSULES BY MOUTH TWICE A DAY           Objective:         No vital signs were available for this telehealth visit.            MELD 3.0: 16 at 06/16/2023  6:41 AM  MELD-Na: 16 at 06/16/2023  6:41 AM  Calculated from:  Serum Creatinine: 0.69 mg/dL (Using min of 1 mg/dL) at 09/81/1914  7:82 AM  Serum Sodium: 134 mmol/L at 06/16/2023  6:41 AM  Total Bilirubin: 6.3 mg/dL at 95/62/1308  6:57 AM  Serum Albumin: 3.6 g/dL (Using max of 3.5 g/dL) at 84/69/6295  2:84 AM  INR(ratio): 1 at 06/14/2023  6:21 AM  Age at listing (hypothetical): 42 years  Sex: Male at 06/16/2023  6:41 AM        Physical Exam    Physical Exam    Due to the nature of the visit via TeleHealth/telephone encounter there is an inability to fully asses the patient. Therefore a limited physical exam documented for this encounter.    Limited Observational Exam   Constitutional: Patient appears well-nourished. No distress. Pleasant and conversant. Pulmonary/Chest: Effort normal    Neurological: Patient is alert and oriented to person, place, and time.   Skin: Normal color for race                Assessment/Plan/Recommendations      Elevated liver tests:      DDx for chronically elevated liver tests includes alcohol; viral hepatitis; autoimmune disorders (AIH, PSC, PBC); metabolic/hereditary (NAFLD/NASH, A1AT, hemochromatosis, Wilson's), with NAFLD/NASH being a diagnosis of exclusion and with the presence of steatosis by imaging and/or biopsy; congestive hepatopathy (heart failure-related liver disease); non liver related causes such as thyroid disease and celiac sprue; and systemic diseases including infiltrative disease (typically a cholestatic pattern).  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 Labs from 08/19/23 showed AST 18 ALT 20 alk phos 101  With near resolution of tests thought likely DILI as cause of elevations  Will update labs and follow q3 months.         Routine Health Care in Patient with Chronic Liver Disease:  We recommend screening for hepatitis A and B, and vaccination if not immune.  All patients with liver disease are at an increased risk for osteoporosis.  We strongly recommend screening for Vitamin D deficiency with aggressive supplementation/replacement, as indicated. Screening for all fat soluble vitamin deficiencies should be performed in those patients with cholestatic liver disease.   Baseline DEXA scan should be performed to evaluate for decreased bone mineral density. If present, Ca/Vit D +/- bisphosphonate therapy should be considered.Follow up DEXA scans shall be based on prior scans as well as therapies.  It is not uncommon for our patients to be referred to an Endocrinologist/bone health specialist.  The preferred analgesic in cirrhosis and liver disease is Tylenol, up to 2g daily.      Tobacco Use History:   The patient has a history of tobacco use and tobacco cessation was recommended.     Vaccinations:  Immunity to hepatitis A/B is unknown  Recommend booster series if low/no immunity. May obtain with our office or with PCP office.   Recommend patient receive annual flu vaccination and stay up-to-date on COVID vaccinations. Recommend staying current on pneumonia vaccinations if patient-appropriate.       Health Maintenance & Cancer Screening:  Recommend staying up to date for cancer screening: mammograms/PAP for women, prostate cancer screening for men, colon cancer screening for all.  Most recent colonoscopy 03/18/22 showed few scattered small apthous erosions in distal ileu, non-bleeding external hemorrhoids. Otherwise normal. Recommended to repeat in 2026- Continue to follow with GI team for further guidance.        Follow Up:   1 year  in clinic or sooner if needed. The patient is to call our office with any questions or changes to your health condition.     A total of 40 minutes was spent in the following activities: Preparing to see the patient, Obtaining and/or reviewing separately obtained history, Performing a medically appropriate examination and/or evaluation, Counseling and educating the patient/family/caregiver, Ordering medications, tests, or procedures, Documenting clinical information in the electronic or other health record and Independently interpreting results (not separately reported) and communicating results to the patient/family/caregiver.    Patient/patient family reports that all questions have been answered at this time.  No additional pending concerns.    Thank you very much for the opportunity to participate in the care of this patient.  If you have any further questions, please don't hesitate to contact our office.     _____________________________  Gerome Koyanagi, APRN, FNP-C  Hepatology & Liver Transplantation  [facility]  [phone]  Fax: [phone]

## 2023-11-08 ENCOUNTER — Encounter: Admit: 2023-11-08 | Discharge: 2023-11-08 | Payer: PRIVATE HEALTH INSURANCE

## 2023-11-08 ENCOUNTER — Ambulatory Visit: Admit: 2023-11-08 | Discharge: 2023-11-09 | Payer: PRIVATE HEALTH INSURANCE

## 2023-11-08 DIAGNOSIS — R7989 Other specified abnormal findings of blood chemistry: Secondary | ICD-10-CM

## 2023-11-08 NOTE — Patient Instructions
 Reason for Visit  Elevated Liver Enzymes  11/08/23    Patient Information/Education:  Labs: We will complete labs in the next couple weeks . Orders have been sent to  [facility] . Please notify us  when you have completed so we are able to request results..    Imaging  [facility] scheduling phone number is [phone] if you need to schedule or reschedule your imaging.      Endoscopy procedures  We recommend routine colonoscopy for colon cancer screening.   We recommend going to the ER if you vomit blood or have blood in your stool.   If you are having your endoscopy procedures performed at [facility], please call GI scheduling at [phone] to schedule.      Medications  You should avoid the use of Non-steroidal Anti-Inflammatory (NSAID) medications as they can cause significant injury to the kidneys. Examples include ibuprofen, Aleve, Advil, Motrin, and Naproxen.   If needed, you may take Tylenol, no more than 2000mg  per day.  Avoid all over the counter and herbal supplements advertised as liver detox/cleanse products and over the counter weight loss supplements as these medication are not regulated by the FDA and can cause harm to your liver/health.   Please contact your nurse coordinator before starting any new medication prescribed by other providers.    Please use the MyChart Refill request or contact your pharmacy directly to request medication refills. Please allow 48 hours and contact our office if you have issues getting your medications.      Fatty Liver Management   Try to lose 5-10% body weight in the next 6-12 months to help with the fatty liver deposits.  Work with your primary care provided to keep your blood cholesterol, blood sugar, and blood pressure under control.  We recommend 30-45 minutes of exercise each day. Even if you don't lose weight, it can help your fatty liver disease.  1-2 cups of black coffee per day can help with fatty liver. Any black tea or black coffee is okay.  We also recommend following a Mediterranean Diet.    Notify our office if you experience yellowing of the eyes or skin, dark amber urine, memory loss, confusion, swelling of the extremities or abdomen, or blood in your stool as these can all be complications from liver disease.     General Instructions:  How to reach us :   Please send a MyChart message to the Transplant Hepatology clinic or call us  at [phone]. Our office hours are 8am-4:30pm. Our fax number is [phone].  How to receive your test results:  Results are released to your MyChart account immediately, please ensure you are utilizing this tool to review results after your clinic visit. Please allow time for your physician to review your results. If you don't have a MyChart account, you will receive a phone call or letter.  If you are expecting results and have not heard from our office within 10 business days of your testing, please call.  If you have labs, imaging, or admission at another facility please let us  know so we can request results.   Scheduling:  Our Scheduling phone number is [phone].    Support groups for many chronic illnesses are available through [facility]: [email or web address] or [phone].    L[facility]  Dr. Adrien Horner or Gerome Koyanagi, APRN  Nurse Coordinator: Jacqulyne Maxim  [phone]    Check-out:  1. Return to clinic in 1  year  with Dr. Adrien Horner Telehealth Gen Hep.

## 2023-11-09 ENCOUNTER — Encounter: Admit: 2023-11-09 | Discharge: 2023-11-09 | Payer: PRIVATE HEALTH INSURANCE

## 2023-11-09 NOTE — Telephone Encounter
 1st call attemepted, no answer left message

## 2023-11-11 ENCOUNTER — Encounter: Admit: 2023-11-11 | Discharge: 2023-11-11 | Payer: PRIVATE HEALTH INSURANCE

## 2023-11-12 ENCOUNTER — Encounter: Admit: 2023-11-12 | Discharge: 2023-11-12 | Payer: PRIVATE HEALTH INSURANCE

## 2023-11-13 ENCOUNTER — Encounter: Admit: 2023-11-13 | Discharge: 2023-11-13 | Payer: PRIVATE HEALTH INSURANCE

## 2023-11-17 ENCOUNTER — Encounter: Admit: 2023-11-17 | Discharge: 2023-11-17 | Payer: PRIVATE HEALTH INSURANCE

## 2023-11-19 ENCOUNTER — Encounter: Admit: 2023-11-19 | Discharge: 2023-11-19 | Payer: PRIVATE HEALTH INSURANCE

## 2023-11-25 ENCOUNTER — Encounter: Admit: 2023-11-25 | Discharge: 2023-11-25 | Payer: PRIVATE HEALTH INSURANCE

## 2023-11-25 ENCOUNTER — Ambulatory Visit: Admit: 2023-11-25 | Discharge: 2023-11-26 | Payer: PRIVATE HEALTH INSURANCE

## 2023-11-25 DIAGNOSIS — K5000 Crohn's disease of small intestine without complications: Secondary | ICD-10-CM

## 2023-11-25 DIAGNOSIS — K222 Esophageal obstruction: Secondary | ICD-10-CM

## 2023-11-25 DIAGNOSIS — K3184 Gastroparesis: Secondary | ICD-10-CM

## 2023-11-25 DIAGNOSIS — R162 Hepatomegaly with splenomegaly, not elsewhere classified: Secondary | ICD-10-CM

## 2023-11-25 DIAGNOSIS — E559 Vitamin D deficiency, unspecified: Secondary | ICD-10-CM

## 2023-11-25 DIAGNOSIS — K861 Other chronic pancreatitis: Secondary | ICD-10-CM

## 2023-11-25 DIAGNOSIS — K602 Anal fissure, unspecified: Secondary | ICD-10-CM

## 2023-11-25 DIAGNOSIS — Z79899 Other long term (current) drug therapy: Secondary | ICD-10-CM

## 2023-11-25 DIAGNOSIS — K21 Gastroesophageal reflux disease with esophagitis without hemorrhage: Secondary | ICD-10-CM

## 2023-11-25 MED ORDER — PREVALITE 4 GRAM PO POWD
2 g | Freq: Every day | ORAL | 1 refills | 90.00000 days | Status: AC
Start: 2023-11-25 — End: ?

## 2023-11-25 NOTE — Progress Notes
 Telehealth Visit Note    Date of Service: 11/25/2023    Subjective:       Alan [name] is a 43 y.o. male patient of Dr. Buckles with PMH Crohn's disease of TI, DM, hypothyroidism, anxiety, fibromyalgia, and chronic pancreatitis.  LOV 08/03/23 with me. He presents today via telehealth for FU.     History of Present Illness     The patient is a 43 year old with terminal ileal Crohn's disease who presents with abdominal pain and diarrhea.    He experiences severe abdominal pain, rated as 7-8 out of 10, located in the frontal abdomen without specific localization. The pain often precedes episodes of diarrhea, which are urgent and frequent, occurring up to six times between midnight and 3 AM. The diarrhea is sometimes so severe that he almost does not make it to the bathroom in time.    He has a history of terminal ileal Crohn's disease and is currently on Stelara, administered every four weeks. He has experienced intermittent issues with medication access due to insurance and pharmacy problems. His last colonoscopy in September 2023 showed small erosions in the terminal ileum, and a CT scan in November 2023 was negative for acute findings.    He takes diphenoxylate/atropine (Lomotil) for diarrhea, which he finds effective. He also takes dicyclomine twice daily and pantoprazole twice daily for pain and gastrointestinal symptoms. He has stopped taking Linzess due to it causing severe diarrhea.    He has a history of anal fissures and rectal bleeding but currently does not use nifedipine ointment as he feels it is not needed. He has not experienced constipation recently, which was previously a significant issue for him.    He has a history of vitamin D deficiency and was advised to recheck levels. He also reports a history of leukocytosis and eosinophilia, with a recent bone marrow biopsy showing normal results. A CT scan in April 2025 showed no acute findings but noted a misty mesentery appearance and mild pancreatic atrophy.    He experiences a sensation of something being stuck in his throat, which has persisted despite esophageal dilation in April 2025. This is described as a constant feeling, not alleviated by the procedure.    He has a history of gastroparesis, affecting his appetite and weight. He reports significant weight loss over the past two years, losing approximately 50-60 pounds, and describes his appetite as 'horrible'. He attributes this to feeling full all the time, which he believes is related to his gastroparesis.    He manages his diabetes and reports recent efforts to improve blood sugar control, although he has experienced fluctuations since a liver issue in December. He is not currently taking any new diabetes medications due to a pharmacy issue.         Patient was hospitalized from 06/02/23 through 06/17/23 after being transferred from outside hospital for elevated liver tests with AST 132 ALT 320 alk phos 325 total bili 5.1. Extensive serological hepatology work-up completed, overall unremarkable other than + AMA.      ERCP 06/02/23 without significant abnormalities. Abdominal US  showed resolved bile duct dilatation, mild hepatosplenomegaly.      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 Mild macrovesicular steatosis. Overall thought likely drug-induced-liver-injury secondary to recent Augmentin use, though PBC could not be ruled out with elevated alkaline phosphatase and + AMA. He was initiated on ursodiol and cholestyramine. Ursodiol was subsequently dc'd. He follows with hepatology and  LOV was 11/08/23 with Gerome Koyanagi.            Results:     10/11/23 EGD/Colonoscopy-  - Normal esophagus. Dilated to 20 mm with                                 moderate mucosal disruption in distal most                                 esophagus suggesting a subtle benign stenosis                                 in distal esophagus. - Erythematous mucosa in the stomach.                                 - Normal examined duodenum.                                 - No specimens collected.      08/23/23 GES-  FINDINGS:     Time:        % Retention   Normal Range    One Hour          98 %;       34.8% to 91%   Two Hours       74 %;         2.7% to 60%   Three Hours     42 %;         0.5% to 28%   Four Hours       21 %;            0% to 10%     IMPRESSION   Delayed gastric emptying times throughout the entirety of the examination   suggestive of gastroparesis.     06/02/23 EUS-  - The celiac axis was visualized and no celiac                                 nodes were seen.                                 - There was no evidence of significant                                 pathology in the left lobe of the liver.                                 - Endosonographic imaging in the entire                                 pancreas showed changes consist with chronic  pancreatitis. Atrophy of panceas tail and body                                 is seen with lobulation and presence of                                 hyperechoic focis and strands.                                 - The ampulla was normal appearing.                                 - Multiple large portahepatis and prepancreatic                                 LNs were seen. FNB x 3 is obtained.       06/06/23-  IMPRESSION   1. Prior cholecystectomy. Bile duct dilatation has resolved since   06/02/2023.   2. Mild hepatosplenomegaly.     - A few scattered small aphthous erosions were                                 found in the distal ileum. Biopsied.                                 - Non-bleeding external hemorrhoids.                                 - The examination was otherwise normal on                                 direct and retroflexion views.                                 - Biopsies were taken with a cold forceps for histology in the entire colon.     ERCP 06/02/23-  Initial attempts as cannulation with the                                 DASH-25 and injection of contrast resulted in                                 opacification of the bile . Then by redirecting                                 the sphincterotome deep selective cannulation                                 of the CBD was achieved. Injection  of contrast                                 revealed a 5 mm CBD with no filling defect or                                 stricture. . A guide wire was passed into the                                 intrahepatics and a 8-9 mm traction biliary                                 sphincterotomy was performed. There was no                                 bleeding. Then a stone extraction balloon was                                 swept multiple times through the duct and no                                 stone or sludge was extracted. Following this a                                 balloon occlusion cholangiogram was performed                                 and did not show any filling defects. The right                                 and left hepatic ducts were normal appearing.                                 Intrahepatic ducts were normal. Good bile flow                                 is seen.     Objective:          ALPRAZolam (XANAX) 1 mg tablet Take one tablet by mouth as Needed.    chlordiazePOXIDE-clidinium (LIBRAX (WITH CLIDINIUM)) 2.5/5 mg capsule TAKE 1 CAPSULE BY MOUTH THREE TIMES A DAY    cholestyramine (PREVALITE) 4 gram powder Take two g by mouth daily. 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    dicyclomine (BENTYL) 20 mg tablet TAKE ONE TABLET BY MOUTH FOUR TIMES DAILY AS NEEDED    diphenoxylate-atropine (LOMOTIL) 2.5-0.025 mg tablet TAKE 1 TABLET BY MOUTH FOUR TIMES A DAY AS NEEDED FOR DIARRHEA    ERGOcalciferoL (vitamin D2) (DRISDOL)  1,250 mcg (50,000 unit) capsule TAKE 1 CAPSULE BY MOUTH EVERY 7 DAYS FOR VITAMIN D DEFICIENCY    eszopiclone (LUNESTA) 3 mg tablet Take one tablet by mouth at bedtime daily.    famotidine (PEPCID) 20 mg tablet Take one tablet by mouth as Needed.    HUMALOG KWIKPEN INSULIN 100 unit/mL subcutaneous PEN Inject four units under the skin three times daily with meals. Correct for blood sugar over 220. BG 221-260: give 2 units. BG 261-300 give 4 units. BG 301-350: give 6 units. 351-400: 8 units. >400: 10 units    L-Methylfolate (DEPLIN 15) 15 mg tablet Take one tablet by mouth daily.    levothyroxine (SYNTHROID) 25 mcg tablet Take one tablet by mouth daily 30 minutes before breakfast.    lipase-protease-amylase (PANCREAZE) 37,000-97,300- 149,900 unit capsule Take one capsule by mouth three times daily. Please use these instructions: Take two capsules WITH each meal, and one capsule with each snack that contains fat or protein. May have three snacks daily.    NIFEDIPINE/LIDOCAINE 0.3%/1.5% IN PETROLATUM OINTMENT (BATCHED COMPOUND) Apply to anal area three times daily as needed    nortriptyline (PAMELOR) 50 mg capsule Take one capsule by mouth at bedtime daily.    ondansetron (ZOFRAN ODT) 4 mg rapid dissolve tablet Dissolve one tablet by mouth every 6 hours as needed. Place on tongue to dissolve.  Indications: nausea    pantoprazole DR (PROTONIX) 40 mg tablet TAKE 1 TABLET BY MOUTH TWICE A DAY    rosuvastatin (CRESTOR) 40 mg tablet Take one tablet by mouth twice weekly. Hold until follow up with hepatology  Indications: excessive fat in the blood    TRESIBA FLEXTOUCH U-200 200 unit/mL (3 mL) injection PEN thirty Units twice daily. Plus a Sliding Scale.    ustekinumab (STELARA) 90 mg syringe Inject 1 mL under the skin every 28 days.    ustekinumab-auub [facility]) 90 mg/mL syringe Inject 1 mL under the skin every 28 days.    VASCEPA 1 gram capsule TAKE 2 CAPSULES BY MOUTH TWICE A DAY          Telehealth Patient Reported Vitals       Row Name 11/25/23 1057                Weight: 88.5 kg (195 lb)        Height: 175.3 cm (5' 9)        Pain Score: SIX        Pain Location: GENERALIZED                      Telehealth Body Mass Index: [phone] at 11/25/2023  1:02 PM    Physical Exam  Limited exam due to nature of telemedicine    No acute distress  No focal deficits  Alert and oriented x 3  Normal affect      Assessment and Plan:  Diagnoses and all orders for this visit:    Crohn's disease involving terminal ileum (CMS-HCC)    Gastroesophageal reflux disease with esophagitis without hemorrhage    Gastroparesis    Anal fissure    Esophageal stenosis    Medication management    Vitamin D deficiency    Other chronic pancreatitis (CMS-HCC)    Hepatosplenomegaly    Other orders  -     cholestyramine (PREVALITE) 4 gram powder; Take two g by mouth daily. 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 Recent colonoscopy revealed small erosions in the terminal ileum. He experiences abdominal pain and diarrhea, possibly exacerbated by bile acid malabsorption post-cholecystectomy. Stelara may not be fully effective, and a switch to Skyrizi is under consideration. Discussed cholestyramine powder for diarrhea and abdominal pain, with potential constipation as a side effect, which he prefers to avoid.  - Order MRI enterography to assess small bowel involvement.  - Check Stelara drug level before the next injection to assess therapeutic levels.  - Order fecal calprotectin to evaluate intestinal inflammation.  - Prescribe cholestyramine powder at half dose to manage diarrhea and abdominal pain.  - Consider switching to Skyrizi if Stelara levels are subtherapeutic or symptoms persist.    Gastroparesis  Gastroparesis with early satiety, bloating, and poor appetite, contributing to weight loss. Previous gastric emptying study confirmed delayed gastric emptying. Discussed potential Botox injection at the pylorus if symptoms persist.  - Increase pancreatic enzyme dosage to three capsules with meals and two with snacks.  - Consider EGD with Botox injection at the pylorus if symptoms persist.  - Encourage dietary management with small, frequent meals and gastroparesis diet.  - Coordinate with dietitian for nutritional support, including protein drinks like [employer] peptide.    Diabetes mellitus  Diabetes management is crucial for controlling gastroparesis symptoms. He reports recent efforts to improve blood glucose control, which has been challenging since a liver issue in December.  - Coordinate with primary care provider for potential adjustments in diabetes medication.    Pancreatitis  Pancreatitis with mild pancreatic atrophy noted on recent imaging. He is on pancreatic enzyme replacement therapy.  - Increase pancreatic enzyme dosage to three capsules with meals and two with snacks.    Depression  Chronic depression significantly impacts quality of life. He expresses frustration and hopelessness due to ongoing health issues.    Fibromyalgia  Chronic fibromyalgia contributing to overall pain and fatigue, impacting daily activities.    Leukocytosis  Leukocytosis with previous bone marrow biopsy showing normal results.    Anal fissures  Anal fissures with no current symptoms. He reports no recent use of nifedipine ointment and no issues with constipation.  - Monitor for recurrence of symptoms and use nifedipine ointment as needed.    Goals of Care  He expresses significant distress and frustration with ongoing health issues, including Crohn's disease, fibromyalgia, and leukocytosis. He has contemplated taking his own life due to the burden of his health problems and the impact on his quality of life.    Follow-up  - Schedule follow-up appointment after MRI enterography and lab results are available.  - Coordinate with dietitian   for ongoing nutritional support.  - Ensure lab tests are sent to [facility] for convenience.             A total of 45 minutes were spent today on this encounter.  This includes preparing for the visit, reviewing medical records, obtaining relevant history, speaking to the patient, interpreting test results, ordering medications and tests and documenting the visit in the electronic health record.

## 2023-12-02 ENCOUNTER — Encounter: Admit: 2023-12-02 | Discharge: 2023-12-02 | Payer: PRIVATE HEALTH INSURANCE

## 2023-12-09 ENCOUNTER — Encounter: Admit: 2023-12-09 | Discharge: 2023-12-09 | Payer: PRIVATE HEALTH INSURANCE

## 2023-12-09 NOTE — Telephone Encounter
 Received fax from [employer] that pancreaze 37L caps not covered by insurance. Preferred alternatives are creon 36000 or Zenpep 40000.

## 2023-12-21 ENCOUNTER — Encounter: Admit: 2023-12-21 | Discharge: 2023-12-21 | Payer: PRIVATE HEALTH INSURANCE

## 2023-12-23 ENCOUNTER — Encounter: Admit: 2023-12-23 | Discharge: 2023-12-23 | Payer: PRIVATE HEALTH INSURANCE

## 2023-12-24 ENCOUNTER — Encounter: Admit: 2023-12-24 | Discharge: 2023-12-24 | Payer: PRIVATE HEALTH INSURANCE

## 2023-12-27 ENCOUNTER — Encounter: Admit: 2023-12-27 | Discharge: 2023-12-27 | Payer: PRIVATE HEALTH INSURANCE

## 2023-12-27 DIAGNOSIS — K5000 Crohn's disease of small intestine without complications: Principal | ICD-10-CM

## 2023-12-27 MED ORDER — SKYRIZI 360 MG/2.4 ML (150 MG/ML) SC INJT
360 mg | SUBCUTANEOUS | 2 refills | 56.00000 days | Status: AC
Start: 2023-12-27 — End: ?

## 2023-12-27 NOTE — Progress Notes
 Buckles, Toribio BROCKS, MD to Me  Leigh Randine SAUNDERS, APRN-NP  Brinda Maxwell, PHARMD  Mort Ruby, [facility]         12/24/23  8:29 AM  I think the totality of evidence suggests that Mr. Alan [name] is not currently controlled on every 4-week Stelara .  Can we see if his insurance will authorize switching to Skyrizi?

## 2023-12-28 ENCOUNTER — Encounter: Admit: 2023-12-28 | Discharge: 2023-12-28 | Payer: PRIVATE HEALTH INSURANCE

## 2023-12-28 NOTE — Progress Notes
 Pharmacy Benefits Investigation    Medication name: risankizumab-rzaa (SKYRIZI) 360 mg/2.4 mL (150 mg/mL) wearable injector  Medication status: new    The insurance requires a prior authorization for the medication. The prior authorization was submitted via CoverMyMeds. Will provide update when available from payor or within 4 business days.    PA number: [id number]    Ellena Sheen  Specialty Pharmacy Patient Advocate

## 2023-12-29 ENCOUNTER — Encounter: Admit: 2023-12-29 | Discharge: 2023-12-29 | Payer: PRIVATE HEALTH INSURANCE

## 2023-12-29 NOTE — Progress Notes
 Pharmacy Benefits Investigation    Medication name: risankizumab-rzaa (SKYRIZI) 360 mg/2.4 mL (150 mg/mL) wearable injector  Medication status: new    The prior authorization was denied by insurance. The insurance did not provide an alternative that is covered.    Insurance provided the following reasons for the denial: d (2.4 per 56 days), is denied. This  decision is based on health plan criteria for SKYRIZI INJ 360/2.4. This drug is covered only if:  One of the following:  (A) You are currently on this drug (under an active [employer] prior authorization for the  treatment of moderately to severely active Crohn's disease).  (B) You are currently on this drug [for moderately to severely active Crohn's disease as documented  by claims history or submission of medical records (document date and duration of therapy)].    If an appeal is pursued, it should be submitted by fax to [phone].    If an alternative therapy is not appropriate and the prescribed medication and dose are necessary, the insurance will require the decision be appealed. This will require a letter for medical necessity. The clinic pharmacist was notified. To proceed with an appeal, completed appeal materials should be returned to the specialty pharmacy team.    Morgaine Kimball  Specialty Pharmacy Patient Advocate

## 2023-12-31 ENCOUNTER — Encounter: Admit: 2023-12-31 | Discharge: 2023-12-31 | Payer: PRIVATE HEALTH INSURANCE

## 2024-01-04 ENCOUNTER — Encounter: Admit: 2024-01-04 | Discharge: 2024-01-04 | Payer: PRIVATE HEALTH INSURANCE

## 2024-01-10 ENCOUNTER — Encounter: Admit: 2024-01-10 | Discharge: 2024-01-10 | Payer: PRIVATE HEALTH INSURANCE

## 2024-01-11 ENCOUNTER — Ambulatory Visit: Admit: 2024-01-11 | Discharge: 2024-01-12 | Payer: PRIVATE HEALTH INSURANCE

## 2024-01-11 ENCOUNTER — Encounter: Admit: 2024-01-11 | Discharge: 2024-01-11 | Payer: PRIVATE HEALTH INSURANCE

## 2024-01-11 DIAGNOSIS — R197 Diarrhea, unspecified: Secondary | ICD-10-CM

## 2024-01-11 DIAGNOSIS — K5900 Constipation, unspecified: Secondary | ICD-10-CM

## 2024-01-11 DIAGNOSIS — K5000 Crohn's disease of small intestine without complications: Principal | ICD-10-CM

## 2024-01-11 DIAGNOSIS — K21 Gastroesophageal reflux disease with esophagitis without hemorrhage: Secondary | ICD-10-CM

## 2024-01-11 DIAGNOSIS — K602 Anal fissure, unspecified: Secondary | ICD-10-CM

## 2024-01-11 DIAGNOSIS — K3184 Gastroparesis: Secondary | ICD-10-CM

## 2024-01-11 DIAGNOSIS — D72829 Elevated white blood cell count, unspecified: Secondary | ICD-10-CM

## 2024-01-11 DIAGNOSIS — R1310 Dysphagia, unspecified: Secondary | ICD-10-CM

## 2024-01-11 DIAGNOSIS — K861 Other chronic pancreatitis: Secondary | ICD-10-CM

## 2024-01-11 DIAGNOSIS — Z79899 Other long term (current) drug therapy: Secondary | ICD-10-CM

## 2024-01-11 MED ORDER — LINZESS 72 MCG PO CAP
72 ug | ORAL_CAPSULE | Freq: Every day | ORAL | 11 refills | 30.00000 days | Status: AC
Start: 2024-01-11 — End: ?
  Filled 2024-01-18: qty 30, 30d supply, fill #0

## 2024-01-11 NOTE — Progress Notes
 Telehealth Visit Note    Date of Service: 01/11/2024    Subjective:           Alan [name] is a 43 y.o. male.    History of Present Illness       Alan [name] is a 43 year old male with Crohn's disease who presents for follow-up regarding his gastrointestinal symptoms.    He has recently started Skyrizi  treatment for Crohn's disease, which began just yesterday, making it too early to assess its effectiveness. He experiences variable bowel habits, including episodes of constipation for which he sometimes takes Linzess , and diarrhea on other days. No blood is observed in his stool.    His weight is currently 192 pounds. He takes pancreatic enzymes with meals and is also on dicyclomine  and modal. He does not take medications for constipation and diarrhea simultaneously. His current dose of Linzess  is 145 mcg, which is the medium dose.    He has not had an MRI scan of his intestines scheduled yet and has been unable to obtain Librax  for spasms. He has previously been given ointment for anal fissures, which he still possesses.    No fevers, chills, or blood in the stool. He reports variable bowel habits with constipation and diarrhea.    MR enterography not yet scheduled    CT abdomen pelvis April 2025  Mild mesenteric haziness  Previously noted pancreatic atrophy    Recent fecal calprotectin was elevated at 232  CRP was elevated at 2.49    Patient had an admission and liver biopsy December 2024 and had drug-induced liver injury secondary to Augmentin    EGD April 2025 with normal esophagus, empirically dilated to 20 mm    Most recent colonoscopy was 2023 with some aphthous ulcerations in the terminal ileum       Objective:          ALPRAZolam  (XANAX ) 1 mg tablet Take one tablet by mouth as Needed.    chlordiazePOXIDE -clidinium (LIBRAX  (WITH CLIDINIUM)) 2.5/5 mg capsule TAKE 1 CAPSULE BY MOUTH THREE TIMES A DAY    cholestyramine  (PREVALITE ) 4 gram powder Take two g by mouth daily. 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    dicyclomine  (BENTYL ) 20 mg tablet TAKE ONE TABLET BY MOUTH FOUR TIMES DAILY AS NEEDED    diphenoxylate -atropine  (LOMOTIL ) 2.5-0.025 mg tablet TAKE 1 TABLET BY MOUTH FOUR TIMES A DAY AS NEEDED FOR DIARRHEA    ERGOcalciferoL  (vitamin D2) (DRISDOL ) 1,250 mcg (50,000 unit) capsule TAKE 1 CAPSULE BY MOUTH EVERY 7 DAYS FOR VITAMIN D  DEFICIENCY    eszopiclone (LUNESTA) 3 mg tablet Take one tablet by mouth at bedtime daily.    famotidine  (PEPCID ) 20 mg tablet Take one tablet by mouth as Needed.    HUMALOG KWIKPEN INSULIN  100 unit/mL subcutaneous PEN Inject four units under the skin three times daily with meals. Correct for blood sugar over 220. BG 221-260: give 2 units. BG 261-300 give 4 units. BG 301-350: give 6 units. 351-400: 8 units. >400: 10 units    L-Methylfolate (DEPLIN 15) 15 mg tablet Take one tablet by mouth daily.    levothyroxine  (SYNTHROID ) 25 mcg tablet Take one tablet by mouth daily 30 minutes before breakfast.    linaCLOtide  (LINZESS ) 72 mcg capsule Take one capsule by mouth daily.    lipase -protease -amylase  (PANCREAZE ) 37,000-97,300- 149,900 unit capsule Take three capsules by mouth three times daily  with meals AND two capsules with snacks. Indications: exocrine pancreatic insufficiency    NIFEDIPINE /LIDOCAINE  0.3%/1.5% IN PETROLATUM OINTMENT (BATCHED COMPOUND) Apply to anal area three times daily as needed    nortriptyline  (PAMELOR ) 50 mg capsule Take one capsule by mouth at bedtime daily.    ondansetron  (ZOFRAN  ODT) 4 mg rapid dissolve tablet Dissolve one tablet by mouth every 6 hours as needed. Place on tongue to dissolve.  Indications: nausea    pantoprazole  DR (PROTONIX ) 40 mg tablet TAKE 1 TABLET BY MOUTH TWICE A DAY    risankizumab -rzaa (SKYRIZI ) 360 mg/2.4 mL (150 mg/mL) wearable injector Inject 2.4 mL under the skin every 8 weeks.    rosuvastatin  (CRESTOR ) 40 mg tablet Take one tablet by mouth twice weekly. Hold until follow up with hepatology  Indications: excessive fat in the blood    TRESIBA FLEXTOUCH U-200 200 unit/mL (3 mL) injection PEN thirty Units twice daily. Plus a Sliding Scale.    ustekinumab  (STELARA ) 90 mg syringe Inject 1 mL under the skin every 28 days.    ustekinumab -auub [facility]) 90 mg/mL syringe Inject 1 mL under the skin every 28 days.    VASCEPA 1 gram capsule TAKE 2 CAPSULES BY MOUTH TWICE A DAY      Telehealth Patient Reported Vitals       Row Name 01/11/24 1451                Weight: 88.5 kg (195 lb)        Height: 175.3 cm (5' 9.02)        Pain Score: SIX        Pain Location: ABDOMEN            Telehealth Body Mass Index: [phone] at 01/11/2024  3:49 PM    Physical Exam  Vital signs were reviewed  Patient is not in distress  Complete physical exam was not performed due to telehealth visit only        Assessment and Plan:              Alan [name] was seen today for abdominal pain.    Diagnoses and all orders for this visit:    Crohn's disease involving terminal ileum (CMS-HCC)    Medication management    Diarrhea, unspecified type    Gastroesophageal reflux disease with esophagitis without hemorrhage    Gastroparesis    Anal fissure    Dysphagia, unspecified type    Other chronic pancreatitis (CMS-HCC)    Leukocytosis, unspecified type    Constipation, unspecified constipation type  -     linaCLOtide  (LINZESS ) 72 mcg capsule; Take one capsule by mouth daily.        Crohn's disease  Ongoing management with Skyrizi , expected efficacy in a few months. Previous treatments ineffective. Skyrizi  anticipated more effective than Stelara .  - Continue Skyrizi , evaluate efficacy over 3-6 months.  - Schedule colonoscopy and upper endoscopy later this year to assess efficacy of Skyrizi .    Constipation  Intermittent constipation managed with Linzess  145 mcg, causing diarrhea.  - Prescribe Linzess  72 mcg to see if there is less diarrhea. Patient Instructions   Decrease Linzess  to 72mcg daily  Continue Skyrzi loading  Plan for EGD (with dilation) and colonoscopy towards the end of the year to see if Skyrizi  is helping  Continue Dicyclomine , Lomotil , Anal fissure ointment as needed  Continue Pantoprazole   Continue Pancreaze  with meals    If you have questions or concerns that need to be addressed before your  next scheduled office visit, please call my nurse at [phone] or send me a MyChart patient message.    A total of 30 minutes were spent today on this encounter.  This includes preparing for the visit, reviewing medical records, obtaining relevant history, speaking to the patient, interpreting test results, ordering medications and tests and documenting the visit in the electronic health record.

## 2024-01-12 ENCOUNTER — Encounter: Admit: 2024-01-12 | Discharge: 2024-01-12 | Payer: PRIVATE HEALTH INSURANCE

## 2024-01-12 DIAGNOSIS — K5000 Crohn's disease of small intestine without complications: Secondary | ICD-10-CM

## 2024-01-12 DIAGNOSIS — Z79899 Other long term (current) drug therapy: Principal | ICD-10-CM

## 2024-01-13 ENCOUNTER — Encounter: Admit: 2024-01-13 | Discharge: 2024-01-13 | Payer: PRIVATE HEALTH INSURANCE

## 2024-01-13 NOTE — Telephone Encounter
 01/13/2024 9:10 AM   Prior authorization submitted for Linzess  72 mcg capsule.

## 2024-01-18 ENCOUNTER — Encounter: Admit: 2024-01-18 | Discharge: 2024-01-18 | Payer: PRIVATE HEALTH INSURANCE

## 2024-01-20 ENCOUNTER — Encounter: Admit: 2024-01-20 | Discharge: 2024-01-20 | Payer: PRIVATE HEALTH INSURANCE

## 2024-01-21 ENCOUNTER — Encounter: Admit: 2024-01-21 | Discharge: 2024-01-21 | Payer: PRIVATE HEALTH INSURANCE

## 2024-01-21 NOTE — Progress Notes
 Pharmacy Benefits Investigation    Medication name: risankizumab -rzaa (SKYRIZI ) 360 mg/2.4 mL (150 mg/mL) wearable injector  Medication status: new    The insurance requires a prior authorization for the medication. The prior authorization was submitted via CoverMyMeds. Will provide update when available from payor or within 4 business days.    PA number: [id number]    Alan [name]  Specialty Pharmacy Patient Advocate

## 2024-01-25 ENCOUNTER — Encounter: Admit: 2024-01-25 | Discharge: 2024-01-25 | Payer: PRIVATE HEALTH INSURANCE

## 2024-01-25 NOTE — Progress Notes
 Pharmacy Benefits Investigation    Medication name: risankizumab -rzaa (SKYRIZI ) 360 mg/2.4 mL (150 mg/mL) wearable injector  Medication status: new    The prior authorization was approved for Pharrell Ledford (PA number [phone]) from 01/21/2024 through 01/20/2025.    The out of pocket cost is unknown because the medication will not be filled at [facility]. The patient's insurance mandates they fill the medication at [facility] - [place], [place] - [address].    A copay card is available and must be obtained by the patient. The patient was provided instructions on how to obtain the card. They will contact the outside pharmacy with the copay card information once available.    Copay card phone number: [phone]  Copay card website: [email or web address]    Notified the clinic pharmacist for next steps.        Ellena Sheen  Specialty Pharmacy Patient Advocate

## 2024-01-30 ENCOUNTER — Encounter: Admit: 2024-01-30 | Discharge: 2024-01-30 | Payer: PRIVATE HEALTH INSURANCE

## 2024-01-31 ENCOUNTER — Encounter: Admit: 2024-01-31 | Discharge: 2024-01-31 | Payer: PRIVATE HEALTH INSURANCE

## 2024-01-31 DIAGNOSIS — K5000 Crohn's disease of small intestine without complications: Principal | ICD-10-CM

## 2024-01-31 DIAGNOSIS — R109 Unspecified abdominal pain: Secondary | ICD-10-CM

## 2024-01-31 MED ORDER — DICYCLOMINE 20 MG PO TAB
20 mg | ORAL_TABLET | Freq: Four times a day (QID) | ORAL | 1 refills | 30.00000 days | Status: AC | PRN
Start: 2024-01-31 — End: ?

## 2024-01-31 MED ORDER — CHLORDIAZEPOXIDE-CLIDINIUM 5-2.5 MG PO CAP
1 | ORAL_CAPSULE | Freq: Three times a day (TID) | ORAL | 2 refills | Status: AC
Start: 2024-01-31 — End: ?

## 2024-01-31 MED ORDER — CHOLESTYRAMINE LIGHT 4 GRAM PO POWD
ORAL | 0 refills | 90.00000 days | Status: AC
Start: 2024-01-31 — End: ?

## 2024-01-31 NOTE — Telephone Encounter
 All Protocol Elements met  Medication name: Cholestyramine   Medication Strength: 4g  Quantity: For 1 year supply  Refills provided:0    Patient was last seen on 01/11/2024 and has follow up scheduled on 04/04/2024.

## 2024-01-31 NOTE — Telephone Encounter
 Refill request received for:    Patient requests refills for dicyclomine  and Librax  be sent to [facility] pharmacy.     Last OV: 01/11/24      Routing to Dr. Enid for approval/refusal

## 2024-02-11 ENCOUNTER — Encounter: Admit: 2024-02-11 | Discharge: 2024-02-11 | Payer: PRIVATE HEALTH INSURANCE

## 2024-02-18 ENCOUNTER — Encounter: Admit: 2024-02-18 | Discharge: 2024-02-18 | Payer: PRIVATE HEALTH INSURANCE

## 2024-02-21 ENCOUNTER — Encounter: Admit: 2024-02-21 | Discharge: 2024-02-21 | Payer: PRIVATE HEALTH INSURANCE

## 2024-02-21 NOTE — Progress Notes
 Specialty Medication Reassessment Attempt      Medication name: SKYRIZI  IV    Contacted Alan [name] to verify compliance and assess tolerance of their specialty medication.    Left voicemail asking patient to return call to the pharmacist at [phone].      Tinnie Baseman, PHARMD

## 2024-02-21 NOTE — Telephone Encounter
 02/21/2024 4:27 PM   Prior authorization for Librax  submitted to insurance.

## 2024-02-25 ENCOUNTER — Encounter: Admit: 2024-02-25 | Discharge: 2024-02-25 | Payer: PRIVATE HEALTH INSURANCE

## 2024-02-25 DIAGNOSIS — K5000 Crohn's disease of small intestine without complications: Principal | ICD-10-CM

## 2024-02-25 MED ORDER — SKYRIZI 360 MG/2.4 ML (150 MG/ML) SC INJT
360 mg | SUBCUTANEOUS | 2 refills | 56.00000 days | Status: AC
Start: 2024-02-25 — End: ?

## 2024-02-25 NOTE — Progress Notes
 Pharmacy Medication Initial Assessment, Reassessment and Education      Indication/Regimen  The regimen of SKYRIZI  SC indefinitely is appropriate for Alan [name] who has Crohn's disease involving terminal ileum (CMS-HCC).    Renal dose adjustments are not required. Hepatic dose adjustments are not required. Dose titration is required. The following dose titration is planned: 600mg  IV at week 0, 4, 8 then transition to maintenance dosing. Alan [name] started taking the medication on 01/10/2024.    The patient has the ability to self-administer the medication(s).      Baseline Characteristics  Disease severity: moderate-severe  Disease description: small bowel  Medications being used concurrently: Skyrizi   Previously trialed agents: Stelara , cimzia , azathioprine, steroids  Allergy and/or intolerance to IBD medications: azathioprine  Co-morbid conditions: None  Additional considerations: None  Baseline endoscopy: Colonoscopy 02/2022 showed few scattered small aphthous erosions were found in the distal ileum  Baseline pathology: Pathology 02/2022 showed Ileal mucosa with focal active ileitis.    Therapeutic Goals and Monitoring  The goal of therapy is to control symptoms and achieve remission.    Disease activity (patient reported): constantly active  Patient reported improvement: 0-24 percent improvement in symptoms since starting therapy  Clinical symptoms: diarrhea and urgency  Steroid course in past 90 days: no  Hospitalization visit for IBD (past 90 days): 0  ED visit for IBD (past 90 days): 0  Flare (past 90 days): no  Patient is not currently in clinical remission. Patient reports continued diarrhea (ranging semi-solid to liquid), urgency, particularly while eating    Endoscopic Evaluation  Endoscopic procedure completed: no (no updated endoscopic procedure)    Histologic Evaluation  Test completed: no (no updated biopsy)    Evaluation  The patient is making progress toward achieving their therapeutic goal. The plan is to continue current therapy.      Past Medical History and Comorbidities  Patient Active Problem List   Diagnosis    Crohn's disease involving terminal ileum (CMS-HCC)    Moderate episode of recurrent major depressive disorder (CMS-HCC)    Generalized anxiety disorder with panic attacks    Leukocytosis    Elevated LFTs    Difficult airway    Other chronic pancreatitis (CMS-HCC)     Additional comorbidities: no      Labs and Diagnostic Tests  Lab Results   Component Value Date/Time    WBC 14.75 (H) 01/10/2024 12:00 AM    RBC 5.80 01/10/2024 12:00 AM    HGB 17.1 01/10/2024 12:00 AM    HCT 49.8 01/10/2024 12:00 AM    MCV 85.9 01/10/2024 12:00 AM    MCH 29.5 01/10/2024 12:00 AM    MCHC 34.3 01/10/2024 12:00 AM    RDW 14.9 (H) 01/10/2024 12:00 AM    PLTCT 260 01/10/2024 12:00 AM    MPV 8.6 (L) 01/10/2024 12:00 AM       Lab Results   Component Value Date/Time    NEUT 65.4 01/10/2024 12:00 AM    ANC 9.63 (H) 01/10/2024 12:00 AM    LYMA 21.2 01/10/2024 12:00 AM    ALC 3.13 01/10/2024 12:00 AM    MONA 5.7 01/10/2024 12:00 AM    AMC 0.84 01/10/2024 12:00 AM    EOSA 4.7 10/06/2023 10:33 AM    AEC 0.96 (H) 01/10/2024 12:00 AM    BASA 0.9 01/10/2024 12:00 AM    ABC 0.14 (H) 01/10/2024 12:00 AM       Lab Results   Component Value Date/Time  NA 136 02/10/2024 12:00 AM    K 4.0 02/10/2024 12:00 AM    CL 108 (H) 02/10/2024 12:00 AM    CO2 22.0 02/10/2024 12:00 AM    GAP 6 (L) 02/10/2024 12:00 AM    BUN 9.9 02/10/2024 12:00 AM    CR 0.63 02/10/2024 12:00 AM    GLU 262 (H) 02/10/2024 12:00 AM       Lab Results   Component Value Date/Time    CA 8.9 02/10/2024 12:00 AM    ALBUMIN 3.5 02/10/2024 12:00 AM    TOTPROT 7.4 02/10/2024 12:00 AM    ALKPHOS 90 02/10/2024 12:00 AM    AST 31 02/10/2024 12:00 AM    ALT 19 02/10/2024 12:00 AM    TOTBILI 0.44 02/10/2024 12:00 AM    GFR 119.5 02/10/2024 12:00 AM       Lab Results   Component Value Date/Time    HEPBSAG Non-reactive 06/02/2023 02:12 AM       No results found for: TSPOTTB, QUANTIFERTB, INTERPQUAN      Allergies  Allergies[1]     Immunizations  Vaccine history was reviewed with the patient. Education was provided on the importance of completing vaccines. The patient should avoid live vaccines.    Immunization History   Administered Date(s) Administered    COVID-19 (PFIZER), mRNA vacc, 30 mcg/0.3 mL (PF) 02/28/2020       Medication Reconciliation  Medication history and reconciliation were performed (including prescription medications, supplements, over the counter, and herbal products). The medication list was updated and the patient's current medication list is included. The patient was instructed to speak with their health care provider before starting any new drug, including prescription or over the counter, natural / herbal products, or vitamins.    Drug Interactions    Drug-Drug Interactions  Drug-drug interactions were evaluated. There were not clinically significant drug-drug interactions.     Drug-Food Interactions  Drug-food interactions were not evaluated (NA - not oral).      Home Medications   Medication Sig   ALPRAZolam  (XANAX ) 1 mg tablet Take one tablet by mouth as Needed.   chlordiazePOXIDE -clidinium (LIBRAX  (WITH CLIDINIUM)) 2.5/5 mg capsule Take one capsule by mouth three times daily.   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   dicyclomine  (BENTYL ) 20 mg tablet Take one tablet by mouth four times daily as needed.   diphenoxylate -atropine  (LOMOTIL ) 2.5-0.025 mg tablet TAKE 1 TABLET BY MOUTH FOUR TIMES A DAY AS NEEDED FOR DIARRHEA   ERGOcalciferoL  (vitamin D2) (DRISDOL ) 1,250 mcg (50,000 unit) capsule TAKE 1 CAPSULE BY MOUTH EVERY 7 DAYS FOR VITAMIN D  DEFICIENCY   eszopiclone (LUNESTA) 3 mg tablet Take one tablet by mouth at bedtime daily.   famotidine  (PEPCID ) 20 mg tablet Take one tablet by mouth as Needed.   HUMALOG KWIKPEN INSULIN  100 unit/mL subcutaneous PEN Inject four units under the skin three times daily with meals. Correct for blood sugar over 220. BG 221-260: give 2 units. BG 261-300 give 4 units. BG 301-350: give 6 units. 351-400: 8 units. >400: 10 units   L-Methylfolate (DEPLIN 15) 15 mg tablet Take one tablet by mouth daily.   levothyroxine  (SYNTHROID ) 25 mcg tablet Take one tablet by mouth daily 30 minutes before breakfast.   linaCLOtide  (LINZESS ) 72  mcg capsule Take one capsule by mouth daily.   lipase -protease -amylase  (PANCREAZE ) 37,000-97,300- 149,900 unit capsule Take three capsules by mouth three times daily with meals AND two capsules with snacks. Indications: exocrine pancreatic insufficiency   NIFEDIPINE /LIDOCAINE  0.3%/1.5% IN PETROLATUM OINTMENT (BATCHED COMPOUND) Apply to anal area three times daily as needed   nortriptyline  (PAMELOR ) 50 mg capsule Take one capsule by mouth at bedtime daily.   ondansetron  (ZOFRAN  ODT) 4 mg rapid dissolve tablet Dissolve one tablet by mouth every 6 hours as needed. Place on tongue to dissolve.  Indications: nausea   pantoprazole  DR (PROTONIX ) 40 mg tablet TAKE 1 TABLET BY MOUTH TWICE A DAY   risankizumab -rzaa (SKYRIZI ) 360 mg/2.4 mL (150 mg/mL) wearable injector Inject 2.4 mL under the skin every 8 weeks.   rosuvastatin  (CRESTOR ) 40 mg tablet Take one tablet by mouth twice weekly. Hold until follow up with hepatology  Indications: excessive fat in the blood   TRESIBA FLEXTOUCH U-200 200 unit/mL (3 mL) injection PEN thirty Units twice daily. Plus a Sliding Scale.   VASCEPA 1 gram capsule TAKE 2 CAPSULES BY MOUTH TWICE A DAY       Adverse Drug Reactions  Adverse drug reactions were reviewed with the patient.    Significant adverse drug reaction(s) were not identified.    Side effect(s) were not reported.    The patient does not have infection related concerns.      Adherence  Refill and adherence history were reviewed with the patient. The patient was educated on the importance of adherence.    Patient is adherent with refills: yes  Patient is meeting refill adherence goal: yes    Patient reported 0 missed doses over the past 6 weeks.  Significance of missed doses: NA - no missed doses   Patient is meeting reported adherence goal.      Safety Precautions    Risk Evaluation and Mitigation (REMS) Assessment: REMS is not required for this medication.    Safety precautions were addressed and discussed with the patient as applicable.    Contraindications: Alan [name] does not have contraindications to this medication.      Pregnancy Status: Male, education not applicable.      Medication Education  The patient was counseled via telephone.    Alan [name] was provided with education on their specialty medication(s). Discussion with the patient included: the medication name (brand and generic), medication class, dosing, frequency, duration, route, proper administration, monitoring, common side effects, contraindications, safety precautions, and food/drug interactions to be aware of. The indication, expectations and possible outcomes from treatment were also discussed.     Appropriate storage, safe handling, and disposal directions were reviewed. The patient was educated on timely administration of therapy and management of missed doses. Adherence with therapy and the patient's ability to be adherent with drug therapies were discussed and the patient was provided options for tools/resources that promote adherence to therapy as needed. The patient's ability to self-administer the medication was assessed. Requirements of the REMS program were discussed with the patient as applicable. Recommended vaccinations were reviewed and discussed with the patient as applicable. The patient was instructed to seek medical attention immediately if they experience signs of an allergic reaction, including but not limited to: a rash; hives; itching; red, swollen, blistered, or peeling skin with or without fever.     Patient was educated on proper administration technique as well as storage/disposal. The patient demonstrated correct technique after instruction.    Alan [name] was  given the opportunity to ask questions. Patient did not have any questions at this time. Patient was reminded of the refill process and encouraged to call with questions. The monitoring and follow-up plan was discussed with the patient. The patient was instructed to contact their health care provider if their symptoms or health problems do not get better or if they become worse. For clinical questions about this medication, the pharmacist can be reached at [phone]. For questions about cost, insurance coverage, or to obtain refills, the patient should contact the pharmacy via MyChart or by calling [phone]. The patient verbalized acceptance and understanding.    Follow-up Plan  The patient will be reassessed within 1 year.    Discussed option to fill at a [facility] pharmacy. The medication(s) will be received from an outside pharmacy ([facility]) based on insurance mandate.       Tinnie Baseman, PHARMD       [1]   Allergies  Allergen Reactions    Iodinated Contrast Media FLUSHING (SKIN) and REDNESS    Iodine  And Iodide Containing Products RASH    Azathioprine RASH    Chlorhexidine RASH    Amoxicillin-Pot Clavulanate UNKNOWN    Chlorphen-Phenyleph-Hydrocodon ITCHING    Gadolinium-Containing Contrast Media FLUSHING (SKIN)     Pt reported has had reaction in past x2 at other location. Pt reports was given benadryl  with 2nd reaction.       Oxycodone ITCHING

## 2024-02-29 ENCOUNTER — Encounter: Admit: 2024-02-29 | Discharge: 2024-02-29 | Payer: PRIVATE HEALTH INSURANCE

## 2024-02-29 DIAGNOSIS — D72829 Elevated white blood cell count, unspecified: Principal | ICD-10-CM

## 2024-03-02 ENCOUNTER — Encounter: Admit: 2024-03-02 | Discharge: 2024-03-02 | Payer: PRIVATE HEALTH INSURANCE

## 2024-03-02 ENCOUNTER — Ambulatory Visit: Admit: 2024-03-02 | Discharge: 2024-03-03 | Payer: PRIVATE HEALTH INSURANCE

## 2024-03-02 NOTE — Progress Notes
 Alan [name] male 43 y.o. with Telehealth New visit for IBD Chron's, Diarrhea , Pancreatitis , and Gastroparesis     Past Medical History:    Accidental fall    Acid reflux    Allergy    Anxiety    Blood in stool    Constipation    Crohn's disease (CMS-HCC)    Depression    Depressive disorder, not elsewhere classified    Essential hypertension    Fibromyalgia    Gastroparesis    Generalized headaches    H/O peripheral neuropathy    Heartburn    Hyperlipidemia    Hypothyroid    Joint pain    Leukocytosis    Nausea    Polycythemia, secondary    Psychiatric illness    Stomach disorder    Type II diabetes mellitus (CMS-HCC)    Ulcer    Vision decreased     Nutrition Assessment:  Current Weight: stable at 200 lb     Estimated body mass index is 29.53 kg/m? as calculated from the following:    Height as of 10/11/23: 175.3 cm (5' 9).    Weight as of 10/11/23: 90.7 kg (200 lb).    Pancreaze  37 x 3 with meals  Abdominal pain, diarrhea reported at 10/2023 GI visit.   Weight loss secondary to Gastroparesis over the past 2 years; 50-60 ;b   Stuck sensation in throat  DM; A1c 9.8 in 05/2023  CCY; Prevalite      Crohn's disease involving terminal ileum (CMS-HCC)  Gastroesophageal reflux disease with esophagitis without hemorrhage  Gastroparesis  Anal fissure  Esophageal stenosis  Medication management  Vitamin D  deficiency  Other chronic pancreatitis: taking Pancreaze  37000 units x 3 with meals (1233 units lipase /kg/meal) + 2 with snacks (13/day)  Hepatosplenomegaly  Fibromyalgia     Patient Report:  No different in symptoms: diarrhea, abdominal pain ongoing   Food changes does not matter  Diarrhea to constipation; last summer with impaction. Taking Linzess  when feeling constipation.   Even white rice, toast  but then no issues with [place] or salad with chicken strips   No difference when eating a banana  Excess urination, constantly dehydrated   Never knows from meal to meal how bowels will react  Does not eat until dinner due to diarrhea uncertainty; large   If eating, does not leave house  Chron's then CCY; dealing with this for years  PERT; 1 with meals   Liver failure in 05/2023 thought to be from a cold med  Has tried protein drinks recommended; same issue Alan [name])   ED over the weekend with thought of stroke or MI; ended up being related to meds   Thinks he remembers trying fiber supplement in the past   12 pills in the morning, 8-10 at night  Water , Regular Gatorade. Pepsi during work with no intolerance.     Wt Readings from Last 10 Encounters:   10/11/23 90.7 kg (200 lb)   10/06/23 90.7 kg (200 lb)   08/10/23 89.8 kg (198 lb)   06/09/23 89.8 kg (197 lb 15.6 oz)   05/12/23 95.3 kg (210 lb)   02/17/23 91.2 kg (201 lb)   09/11/22 95.1 kg (209 lb 10.5 oz)   04/09/22 93.9 kg (207 lb)   03/18/22 90.7 kg (200 lb)   03/12/22 (P) 93.4 kg (206 lb)      Pertinent Labs/Tests: pancreatic elastase 141 (08/2021),fecal calprotectin 492 in 05/2023  GES 07/2023 FINDINGS:     Time:        %  Retention   Normal Range    One Hour          98 %;       34.8% to 91%   Two Hours       74 %;         2.7% to 60%   Three Hours     42 %;         0.5% to 28%   Four Hours       21 %;            0% to 10%     Pertinent Medications/Supplements:     Intervention/Plan:  Increase Pancreaze ; take 2 at start of meal, 1-2 during meal (more for larger or fattier meal)  Take 2 with snacks   Rehydrate with electrolytes 50% of the time   Liquid IV, Drip Drop  Drink more electrolyte waters vs Pepsi due to risk of high concentrated sweets increases colon stimulation and inflammation  Low fat, soft fiber diet to slow diarrhea  Smaller meals for ease on stomach and bowel emptying  Alan [name] as supplement to missing intake. Meal's worth. Drink slowly over 20-30 minutes with enzymes like a meal. May also divided up into servings throughout the day.  Add soluble fiber absorbant foods: banana, white toast, potatoes, oatmeal    Recommendations:   Rehydrate with 50% electrolyte water  minimum  Take 2 enzymes just before 1st bite, 3rd enzyme during meal   If longer than 30 minutes, take a 4th enzyme  Give 7-10 days to adjust. If no improvement, try 2 and 2 enzymes at meals  Use day off to include a 2nd meal with enzymes or shake with enzymes   1 banana a day      Diarrhea: intentional add ins of soluble fiber and starch every few hours - Goal  Gastroparesis: small meals, low fiber, low fat  Chron's with diarrhea: small meals, low fiber, low fat, low caffeine, acid, spice, improve modified fiber intake  Pancreatitis: low fat, no alcohol  Liver: lower fat, healthy fat, healthy diet overall  Diabetes: spread out intake with controlled carbs throughout the day, paired with protein, fiber as tolerated (modified soft)    Follow Up: 3 weeks  Alan [name], RD, LD *[phone]

## 2024-03-03 DIAGNOSIS — K3184 Gastroparesis: Secondary | ICD-10-CM

## 2024-03-03 DIAGNOSIS — K5000 Crohn's disease of small intestine without complications: Principal | ICD-10-CM

## 2024-03-07 ENCOUNTER — Encounter: Admit: 2024-03-07 | Discharge: 2024-03-07 | Payer: PRIVATE HEALTH INSURANCE

## 2024-03-07 DIAGNOSIS — D72829 Elevated white blood cell count, unspecified: Principal | ICD-10-CM

## 2024-03-08 ENCOUNTER — Encounter: Admit: 2024-03-08 | Discharge: 2024-03-08 | Payer: PRIVATE HEALTH INSURANCE

## 2024-03-15 ENCOUNTER — Encounter: Admit: 2024-03-15 | Discharge: 2024-03-15 | Payer: PRIVATE HEALTH INSURANCE

## 2024-03-15 DIAGNOSIS — K21 Gastroesophageal reflux disease with esophagitis without hemorrhage: Principal | ICD-10-CM

## 2024-03-15 MED ORDER — PANTOPRAZOLE 40 MG PO TBEC
40 mg | ORAL_TABLET | Freq: Two times a day (BID) | ORAL | 3 refills | 90.00000 days | Status: AC
Start: 2024-03-15 — End: ?

## 2024-03-15 NOTE — Telephone Encounter
 Refill request received for:    Patient sent MyChart message requesting refill for pantoprazole .     Last OV: 01/11/24    Routing to Dr. Enid for approval/refusal

## 2024-03-20 ENCOUNTER — Encounter: Admit: 2024-03-20 | Discharge: 2024-03-20 | Payer: PRIVATE HEALTH INSURANCE

## 2024-03-20 ENCOUNTER — Ambulatory Visit: Admit: 2024-03-20 | Discharge: 2024-03-21 | Payer: PRIVATE HEALTH INSURANCE

## 2024-03-28 ENCOUNTER — Ambulatory Visit: Admit: 2024-03-28 | Discharge: 2024-03-29 | Payer: PRIVATE HEALTH INSURANCE

## 2024-03-28 ENCOUNTER — Encounter: Admit: 2024-03-28 | Discharge: 2024-03-28 | Payer: PRIVATE HEALTH INSURANCE

## 2024-03-28 NOTE — Progress Notes
 Alan [name] male 43 y.o. with Telehealth Follow Up visit from 03/02/24 for  IBD Chron's, Diarrhea , Pancreatitis , and Gastroparesis   Past Medical History includes;  Crohn's disease involving terminal ileum (CMS-HCC)  Gastroesophageal reflux disease with esophagitis without hemorrhage  Gastroparesis  CCY  Anal fissure  Esophageal stenosis  Medication management  Vitamin D  deficiency  Other chronic pancreatitis: taking Pancreaze  37000 units x 3 with meals (1233 units lipase /kg/meal) + 2 with snacks (13/day)  Hepatosplenomegaly  Fibromyalgia   Acid reflux   T2DM; A1c 9.8 in 05/2023  CCY; Prevalite    Leukocytosis     Current Diet Therapy   Diarrhea: intentional add ins of soluble fiber and starch every few hours - Goal  Gastroparesis: small meals, low fiber, low fat  Chron's with diarrhea: small meals, low fiber, low fat, low caffeine, acid, spice, improve modified fiber intake  Pancreatitis: low fat, no alcohol  Liver: lower fat, healthy fat, healthy diet overall  Diabetes: spread out intake with controlled carbs throughout the day, paired with protein, fiber as tolerated (modified soft)    Nutrition Assessment:     Initial visit summary  Initial visit with report of only 1 PERT with meals  Pill burden: 12 meds in the morning, 8-10 at night  Diarrhea with all food intake, abdominal pain ongoing. Stuck sensation in throat.   Unintentional weight loss of 50-60 lb x 2 years. Currently stable.  Tolerates regular Pepsi at work. Drinks water  and Gatorade  Skips intake throughout the day to avoid uncertainty of diarrhea occurrence     Patient Report:  Fecal urgency after meals has improved with increase of pancreatic enzyme  With continued episodes of breakthrough diarrhea with food and following days of constipation    More constipated and needing Linzess ; 2-3 days no BM, diarrhea for a couple of days and back and forth  PERT increase helps meal time tolerance but still afraid to eat during the day  8 years no breakfast and lunch, longer  Tried some snacks on days off, no meals due to lack of appetite.   Still with constant thirst and need urinate; not following with a urologist - estimates 120-150 oz   Blood sugar at 180-200 mg/dL (random). Blood sugar rise in the morning with med intake.   Lows as well and will eat   Increased electrolytes   Denied coverage of ChlordiazPoxidine, clinidium   Eating 1 banana/day  Works 3 jobs without days off (by choice). Makes it difficult to challenge new diet changes.   Continues to fight stress and anxiety   Feels he should just eat what he wants since nothing seems to work.  However, recognizes more control with enzyme at meals and without work on 3 meals/day.    Wt Readings from Last 10 Encounters:   03/07/24 91.4 kg (201 lb 6.4 oz)   10/11/23 90.7 kg (200 lb)   10/06/23 90.7 kg (200 lb)   08/10/23 89.8 kg (198 lb)   06/09/23 89.8 kg (197 lb 15.6 oz)   05/12/23 95.3 kg (210 lb)   02/17/23 91.2 kg (201 lb)   09/11/22 95.1 kg (209 lb 10.5 oz)   04/09/22 93.9 kg (207 lb)   03/18/22 90.7 kg (200 lb)        Pertinent Labs/Tests: pancreatic elastase 141 (08/2021),fecal calprotectin 492 in 05/2023  GES 07/2023 FINDINGS:     Time:        % Retention   Normal Range    One  Hour          98 %;       34.8% to 91%   Two Hours       74 %;         2.7% to 60%   Three Hours     42 %;         0.5% to 28%   Four Hours       21 %;            0% to 10%     Pertinent Medications: Skyrizi , pantaprazole, Pancreaze  37,000 x 3 with meals and 2 with snacks, Bentyl , Zofran , Lomotil ,  Linzess , synthroid , Farxiga , insulin  with meals and long lasting     Intervention/Plan:  Baseline goal to control fat malabsorption with use of pancreatic enzyme x 2-3 with meals and 1-2 with snacks  Noted fecal urgency improvement at mealtimes with PERT allows for liberalize diet  Goal of 3 meals/day spread out evenly (carb controlled)  would benefit GI bowel habit consistency, Crohn's disease, and diabetes , liberalized fat to healthy fat in diet as tolerated with Crohn's (with enzymes in place)  If established control, can slowly trial fiber foods to benefit IBD and bowel habit control   Soft fiber to start   Currently with one intake a day and little fiber, low fat  Continued encouragement to intake through the day as baseline diet pancreatic enzyme control around mealtimes  Barrier: Long history with 1 meal a day apprehension to eat while away from home  Plan: slowly add foods at mealtimes to benefit blood sugar control and GI bowel consistency. Start with snacks; not hungry.   Needs continued follow ups to manage DM with excessive thirst and urination; goal to establish carb controlled diet     Long term Goals:  Pancreaze  to control fat malabsorption  Small meals, low and soft fiber for gastroparesis  3 meals/day for bowel regularity and diabetes   Modified fiber for bowel regularity, diabetes, IBD    Recommendations:   Continue Pancreaze  2-3 with larger meals, 1-2 with snacks. This controls fat malabsorption.  Treat constipation separately - continue with meds while diet is being established  Slowly add in foods as snacks at meal times (every 3-4 hours apart) with goal for lifestyle of 3 mini meals/day (to start) - benefit blood sugar control, bowel regularity, smaller intakes for stomach comfort  Granola bar to start day. Next step will be to add protein for blood sugar control.  Lunchable for lunch  Continue dinner    Protein for gastroparesis:     Bars:  Avoid whole nuts, whole seeds, and dried fruit  Choose a low to moderate fiber bar of 2-5g; choose lower if with increased symptoms  Aim for 10g protein or more  Choose mini portions, kid sized portions, or split bars in half if needed  Choose rice krispie and oat based (soft and lighter). Look for wafer bars (soft/light)    Low Fiber Protein bars brand examples:  Proti Diet wafer protein bars  Special K protein bars  [employer] protein bars   Power Crunch wafer protein bars  Museum/gallery curator or Clif Kid ZBar  KIND Breakfast Protein   Mini Perfect Bar, [employer] fast streamer, GoMacro  Built Bar Puff Protein Bars  Legendary Protein Pastry (includes Erythritol; caution if with diarrhea)        Rexene Mantel, RD, LD *[phone]

## 2024-04-04 ENCOUNTER — Encounter: Admit: 2024-04-04 | Discharge: 2024-04-04 | Payer: PRIVATE HEALTH INSURANCE

## 2024-04-04 ENCOUNTER — Ambulatory Visit: Admit: 2024-04-04 | Discharge: 2024-04-05 | Payer: PRIVATE HEALTH INSURANCE

## 2024-04-04 DIAGNOSIS — R1310 Dysphagia, unspecified: Secondary | ICD-10-CM

## 2024-04-04 DIAGNOSIS — K861 Other chronic pancreatitis: Secondary | ICD-10-CM

## 2024-04-04 DIAGNOSIS — K21 Gastroesophageal reflux disease with esophagitis without hemorrhage: Principal | ICD-10-CM

## 2024-04-04 DIAGNOSIS — Z79899 Other long term (current) drug therapy: Secondary | ICD-10-CM

## 2024-04-04 DIAGNOSIS — D72829 Elevated white blood cell count, unspecified: Secondary | ICD-10-CM

## 2024-04-04 DIAGNOSIS — K5000 Crohn's disease of small intestine without complications: Secondary | ICD-10-CM

## 2024-04-04 DIAGNOSIS — K5900 Constipation, unspecified: Secondary | ICD-10-CM

## 2024-04-04 DIAGNOSIS — R109 Unspecified abdominal pain: Secondary | ICD-10-CM

## 2024-04-04 NOTE — Progress Notes
 Telehealth Visit Note    Date of Service: 04/04/2024    Subjective:           Alan [name] is a 43 y.o. male.    History of Present Illness       Alan [name] is a 43 year old male who presents for follow-up of chronic abdominal pain and elevated white blood cell count.    He has been experiencing chronic abdominal pain for years, describing it as persistent without acute exacerbations. Previous interventions included trigger point injections or nerve blocks, though the timing of the last intervention is unclear. Current medications for pain management include dicyclomine , Lomotil , and pantoprazole . He takes pancreatic enzymes with meals and uses an ointment for fissures. Linzess  is taken as needed. He was unable to obtain Librax  due to insurance issues.    He is currently on Skyrizi , having switched from Stelara , but is uncertain if it is more effective. No significant diarrhea is reported. An MRI enterography of the bowels was previously scheduled but not completed, and a fecal calprotectin stool test has not been done.    He has a history of elevated white blood cell count, for which he sees a hematologist. Despite chronic elevation, the cause remains undetermined. A CT scan showed no acute findings but noted misty mesentery dating back to 2019, mild pancreatic atrophy, and scattered pulmonary nodules. His weight is stable at baseline.         Objective:          ALPRAZolam  (XANAX ) 1 mg tablet Take one tablet by mouth as Needed.    chlordiazePOXIDE -clidinium (LIBRAX  (WITH CLIDINIUM)) 2.5/5 mg capsule Take one capsule by mouth three times daily.    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    dicyclomine  (BENTYL ) 20 mg tablet Take one tablet by mouth four times daily as needed.    diphenoxylate -atropine  (LOMOTIL ) 2.5-0.025 mg tablet TAKE 1 TABLET BY MOUTH FOUR TIMES A DAY AS NEEDED FOR DIARRHEA    ERGOcalciferoL  (vitamin D2) (DRISDOL ) 1,250 mcg (50,000 unit) capsule TAKE 1 CAPSULE BY MOUTH EVERY 7 DAYS FOR VITAMIN D  DEFICIENCY    eszopiclone (LUNESTA) 3 mg tablet Take one tablet by mouth at bedtime daily.    famotidine  (PEPCID ) 20 mg tablet Take one tablet by mouth as Needed.    HUMALOG KWIKPEN INSULIN  100 unit/mL subcutaneous PEN Inject four units under the skin three times daily with meals. Correct for blood sugar over 220. BG 221-260: give 2 units. BG 261-300 give 4 units. BG 301-350: give 6 units. 351-400: 8 units. >400: 10 units    L-Methylfolate (DEPLIN 15) 15 mg tablet Take one tablet by mouth daily.    levothyroxine  (SYNTHROID ) 25 mcg tablet Take one tablet by mouth daily 30 minutes before breakfast.    linaCLOtide  (LINZESS ) 72 mcg capsule Take one capsule by mouth daily.    lipase -protease -amylase  (PANCREAZE ) 37,000-97,300- 149,900 unit capsule Take three capsules by mouth three times daily with meals AND two capsules with snacks. Indications: exocrine pancreatic insufficiency    NIFEDIPINE /LIDOCAINE  0.3%/1.5% IN PETROLATUM OINTMENT (BATCHED COMPOUND) Apply to anal area three times daily as needed    nortriptyline  (PAMELOR ) 50 mg capsule Take one capsule by mouth at bedtime daily.    ondansetron  (ZOFRAN  ODT)  4 mg rapid dissolve tablet Dissolve one tablet by mouth every 6 hours as needed. Place on tongue to dissolve.  Indications: nausea    pantoprazole  DR (PROTONIX ) 40 mg tablet Take one tablet by mouth twice daily.    risankizumab -rzaa (SKYRIZI ) 360 mg/2.4 mL (150 mg/mL) wearable injector Inject 2.4 mL under the skin every 8 weeks. Indications: Crohn's disease    rosuvastatin  (CRESTOR ) 40 mg tablet Take one tablet by mouth twice weekly. Hold until follow up with hepatology  Indications: excessive fat in the blood    TRESIBA FLEXTOUCH U-200 200 unit/mL (3 mL) injection PEN thirty Units twice daily. Plus a Sliding Scale.    VASCEPA 1 gram capsule TAKE 2 CAPSULES BY MOUTH TWICE A DAY      Telehealth Patient Reported Vitals       Row Name 04/04/24 1357                Weight: 93 kg (205 lb)        Height: 175.3 cm (5' 9)        Pain Score: Five            Telehealth Body Mass Index: [id number] at 04/04/2024  3:43 PM    Physical Exam  Vital signs were reviewed  Patient is not in distress  Complete physical exam was not performed due to telehealth visit only        Assessment and Plan:       Alan [name] was seen today for follow up and abdominal pain.    Diagnoses and all orders for this visit:    Crohn's disease involving terminal ileum (CMS-HCC)    Gastroesophageal reflux disease with esophagitis without hemorrhage    Medication management    Abdominal pain, unspecified abdominal location    Abdominal cramping    Leukocytosis, unspecified type    Dysphagia, unspecified type    Other chronic pancreatitis (CMS-HCC)    Constipation, unspecified constipation type        Crohn's disease with chronic abdominal pain  Chronic abdominal pain due to Crohn's disease. Skyrizi  initiated, efficacy pending evaluation. Current pain management with dicyclomine  and Lomotil  requires further assessment.  - Continue Skyrizi .  - Schedule MRI enterography to assess for active inflammation or blockages in the small bowel.  - Continue dicyclomine  and Lomotil  for pain management.  - Patient will ask Dr. Eleanor [name] regarding pain management consultation.    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 On pancreatic enzyme replacement therapy.  - Continue Pancreaze  with meals.    Gastroesophageal reflux disease (GERD)  GERD managed with pantoprazole .  - Continue pantoprazole .         Patient Instructions   Schedule MR enterography  Continue Skyrizi   Recommend to see a chronic pain management specialist  Linzess  72 as needed for constipation  Pantoprazole  twice daily  Pancreaze  with meals  Dicyclomine  as needed    If you have questions or concerns that need to be addressed before your next scheduled office visit, please call my nurse at [phone] or send me a MyChart patient message.`    A total of 30 minutes were spent today on this encounter.  This includes preparing for the visit, reviewing medical records, obtaining relevant history, speaking to the patient, interpreting test results, ordering medications and tests and documenting the visit in the electronic health record.

## 2024-04-14 ENCOUNTER — Encounter: Admit: 2024-04-14 | Discharge: 2024-04-14 | Payer: PRIVATE HEALTH INSURANCE

## 2024-04-14 DIAGNOSIS — R45851 Suicidal ideations: Principal | ICD-10-CM

## 2024-04-14 DIAGNOSIS — F32A Depression, unspecified depression type: Secondary | ICD-10-CM

## 2024-04-14 DIAGNOSIS — R45850 Homicidal ideations: Secondary | ICD-10-CM

## 2024-04-14 DIAGNOSIS — F4310 Post-traumatic stress disorder, unspecified: Secondary | ICD-10-CM

## 2024-04-14 MED ORDER — DIPHENHYDRAMINE HCL 50 MG/ML IJ SOLN
25 mg | Freq: Once | INTRAVENOUS | 0 refills | Status: CP
Start: 2024-04-14 — End: ?
  Administered 2024-04-15: 02:00:00 25 mg via INTRAVENOUS

## 2024-04-14 MED ORDER — MAGNESIUM SULFATE IN D5W 1 GRAM/100 ML IV PGBK
1 g | Freq: Once | INTRAVENOUS | 0 refills | Status: CP
Start: 2024-04-14 — End: ?
  Administered 2024-04-15: 02:00:00 1 g via INTRAVENOUS

## 2024-04-14 MED ORDER — KETOROLAC 15 MG/ML IJ SOLN
15 mg | Freq: Once | INTRAVENOUS | 0 refills | Status: CP
Start: 2024-04-14 — End: ?
  Administered 2024-04-15: 02:00:00 15 mg via INTRAVENOUS

## 2024-04-14 MED ORDER — LACTATED RINGERS IV BOLUS
1000 mL | Freq: Once | INTRAVENOUS | 0 refills | Status: CP
Start: 2024-04-14 — End: ?
  Administered 2024-04-15: 02:00:00 1000 mL via INTRAVENOUS

## 2024-04-14 MED ORDER — ACETAMINOPHEN 500 MG PO TAB
1000 mg | Freq: Once | ORAL | 0 refills | Status: CP
Start: 2024-04-14 — End: ?
  Administered 2024-04-15: 02:00:00 1000 mg via ORAL

## 2024-04-14 MED ORDER — METOCLOPRAMIDE HCL 5 MG/ML IJ SOLN
10 mg | Freq: Once | INTRAVENOUS | 0 refills | Status: CP
Start: 2024-04-14 — End: ?
  Administered 2024-04-15: 02:00:00 10 mg via INTRAVENOUS

## 2024-04-14 NOTE — ED Notes [6]
 Alan [name] is a 43 year old male who presents to the ED with cc suicidal ideation. Pt reports that he has struggled with mental health for years and over the past 3 years they have changed his medications multiple times but nothing seems to help. He reports that he has been having thoughts of wanting to harm himself over the past couple of days but has not actually done anything to harm himself. He states that last night he had dreams where he Fucking killed everyone. Pt stating I wish you could just put me in a coma and let me sleep. Pt also reports generalized body pain d/t hx of fibromyalgia. Denies other concerns at this time. Pt is hooked up to monitors with vss, nad, axox4, afebrile, breathing even and unlabored. Bed locked in lowest position, call light within reach. Pt belongings placed outside door with CO.       Past Medical History:    Accidental fall    Acid reflux    Allergy    Anxiety    Blood in stool    Constipation    Crohn's disease (CMS-HCC)    Depression    Depressive disorder, not elsewhere classified    Essential hypertension    Fibromyalgia    Gastroparesis    Generalized headaches    H/O peripheral neuropathy    Heartburn    Hyperlipidemia    Hypothyroid    Joint pain    Leukocytosis    Nausea    Polycythemia, secondary    Psychiatric illness    Stomach disorder    Type II diabetes mellitus (CMS-HCC)    Ulcer    Vision decreased

## 2024-04-15 ENCOUNTER — Encounter: Admit: 2024-04-15 | Discharge: 2024-04-15 | Payer: PRIVATE HEALTH INSURANCE

## 2024-04-15 LAB — POC GLUCOSE
~~LOC~~ BKR POC GLUCOSE: 231 mg/dL — ABNORMAL HIGH (ref 70–100)
~~LOC~~ BKR POC GLUCOSE: 276 mg/dL — ABNORMAL HIGH (ref 70–100)
~~LOC~~ BKR POC GLUCOSE: 237 mg/dL — ABNORMAL HIGH (ref 70–100)
~~LOC~~ BKR POC GLUCOSE: 318 mg/dL — ABNORMAL HIGH (ref 70–100)

## 2024-04-15 LAB — LIPID PROFILE
~~LOC~~ BKR CHOLESTEROL: 210 mg/dL — ABNORMAL HIGH (ref <200)
~~LOC~~ BKR HDL: 32 mg/dL — ABNORMAL LOW (ref >40)
~~LOC~~ BKR LDL: 91 mg/dL (ref <100)
~~LOC~~ BKR NON HDL CHOLESTEROL: 178 mg/dL
~~LOC~~ BKR TRIGLYCERIDES: 563 mg/dL — ABNORMAL HIGH (ref <150)
~~LOC~~ BKR VLDL: 112 mg/dL

## 2024-04-15 LAB — ACETAMINOPHEN LEVEL: ~~LOC~~ BKR ACETAMINOPHEN: <10 g/mL (ref 98–<=20.0)

## 2024-04-15 LAB — PHENCYCLIDINES-URINE RANDOM: ~~LOC~~ BKR PHENCYCLIDINE (PCP): NEGATIVE mmol/L (ref 21–30)

## 2024-04-15 LAB — BARBITURATES-URINE RANDOM: ~~LOC~~ BKR BARBITURATES: NEGATIVE mg/dL — ABNORMAL HIGH (ref 8.5–10.6)

## 2024-04-15 LAB — POC POTASSIUM: ~~LOC~~ BKR POC POTASSIUM: 3.8 mmol/L (ref 3.5–5.1)

## 2024-04-15 LAB — COCAINE-URINE RANDOM: ~~LOC~~ BKR COCAINE: NEGATIVE g/dL (ref 3.5–5.0)

## 2024-04-15 LAB — ECG 12-LEAD
P AXIS: 50 degrees
P-R INTERVAL: 158 ms (ref 0.00–0.80)
Q-T INTERVAL: 386 ms (ref 0.00–0.20)
QRS DURATION: 118 ms — ABNORMAL HIGH (ref 0.00–0.45)
QTC CALCULATION (BAZETT): 450 ms (ref <=20.6)
R AXIS: -42 degrees
T AXIS: 23 degrees
VENTRICULAR RATE: 82 {beats}/min (ref >60–4.80)

## 2024-04-15 LAB — COMPREHENSIVE METABOLIC PANEL
~~LOC~~ BKR ALK PHOSPHATASE: 77 U/L (ref 25–110)
~~LOC~~ BKR ALT: 21 U/L — ABNORMAL HIGH (ref 7–56)
~~LOC~~ BKR TOTAL BILIRUBIN: 0.5 mg/dL — ABNORMAL LOW (ref 0.2–1.3)

## 2024-04-15 LAB — POC SODIUM: ~~LOC~~ BKR POC SODIUM: 134 mmol/L — ABNORMAL LOW (ref 137–147)

## 2024-04-15 LAB — POC BLOOD GAS VEN
~~LOC~~ BKR POC BICARB, VEN: 27 mmol/L
~~LOC~~ BKR POC CO2, VEN: 45 mmHg (ref 36–50)
~~LOC~~ BKR POC PH, VEN: 7.4 (ref 7.30–7.40)

## 2024-04-15 LAB — POC HEMATOCRIT&HEMOGLOBIN
~~LOC~~ BKR POC HEMATOCRIT: 46 % (ref 40–50)
~~LOC~~ BKR POC HEMOGLOBIN: 15 g/dL (ref 13.5–16.5)

## 2024-04-15 LAB — FENTANYL URINE: ~~LOC~~ BKR FENTANYL URINE: NEGATIVE 10*3/uL (ref 3–12)

## 2024-04-15 LAB — SALICYLATE LEVEL

## 2024-04-15 LAB — ALCOHOL LEVEL: ~~LOC~~ BKR ALCOHOL: <10 mg/dL — ABNORMAL LOW (ref 7–<10)

## 2024-04-15 LAB — TSH WITH FREE T4 REFLEX: ~~LOC~~ BKR TSH: 3.1 [IU]/mL (ref 0.35–5.00)

## 2024-04-15 LAB — CBC AND DIFF
~~LOC~~ BKR RBC COUNT: 5.3 10*6/uL (ref 4.40–5.50)
~~LOC~~ BKR WBC COUNT: 12 10*3/uL — ABNORMAL HIGH (ref 4.50–11.00)

## 2024-04-15 LAB — BENZODIAZEPINES-URINE RANDOM: ~~LOC~~ BKR BENZODIAZEPINES: POSITIVE g/dL — AB (ref 6.0–8.0)

## 2024-04-15 LAB — OPIATES-URINE RANDOM: ~~LOC~~ BKR OPIATES: NEGATIVE U/L (ref 7–40)

## 2024-04-15 LAB — AMPHETAMINES-URINE RANDOM: ~~LOC~~ BKR AMPHETAMINES: NEGATIVE mg/dL (ref 0.40–1.24)

## 2024-04-15 MED ORDER — HALOPERIDOL 5 MG PO TAB
5 mg | ORAL | 0 refills | Status: DC | PRN
Start: 2024-04-15 — End: 2024-04-19

## 2024-04-15 MED ORDER — ERGOCALCIFEROL (VITAMIN D2) 1,250 MCG (50,000 UNIT) PO CAP
50000 [IU] | ORAL | 0 refills | Status: DC
Start: 2024-04-15 — End: 2024-04-19
  Administered 2024-04-15: 15:00:00 50000 [IU] via ORAL

## 2024-04-15 MED ORDER — DICYCLOMINE 10 MG PO CAP
20 mg | Freq: Four times a day (QID) | ORAL | 0 refills | Status: DC | PRN
Start: 2024-04-15 — End: 2024-04-19

## 2024-04-15 MED ORDER — RISPERIDONE 2 MG PO TAB
2 mg | Freq: Every evening | ORAL | 0 refills | Status: DC
Start: 2024-04-15 — End: 2024-04-16
  Administered 2024-04-16: 02:00:00 2 mg via ORAL

## 2024-04-15 MED ORDER — ONDANSETRON 4 MG PO TBDI
4 mg | ORAL | 0 refills | Status: DC | PRN
Start: 2024-04-15 — End: 2024-04-19

## 2024-04-15 MED ORDER — INSULIN ASPART 100 UNIT/ML SC FLEXPEN
0-12 [IU] | Freq: Before meals | SUBCUTANEOUS | 0 refills | Status: DC
Start: 2024-04-15 — End: 2024-04-19
  Administered 2024-04-15: 13:00:00 6 [IU] via SUBCUTANEOUS

## 2024-04-15 MED ORDER — POLYETHYLENE GLYCOL 3350 17 GRAM PO PWPK
1 | Freq: Every day | ORAL | 0 refills | Status: DC | PRN
Start: 2024-04-15 — End: 2024-04-19
  Administered 2024-04-16 – 2024-04-18 (×3): 17 g via ORAL

## 2024-04-15 MED ORDER — LIPASE-PROTEASE-AMYLASE (CREON 36,000) 36,000-114,000- 180,000 UNIT PO CPDR
2 | ORAL | 0 refills | Status: DC
Start: 2024-04-15 — End: 2024-04-19
  Administered 2024-04-15 – 2024-04-19 (×7): 2 via ORAL

## 2024-04-15 MED ORDER — IMS MIXTURE TEMPLATE
1.5 mg | Freq: Two times a day (BID) | ORAL | 0 refills | Status: DC | PRN
Start: 2024-04-15 — End: 2024-04-19
  Administered 2024-04-16 – 2024-04-19 (×12): 1.5 mg via ORAL

## 2024-04-15 MED ORDER — DIAZEPAM 5 MG/ML IJ SYRG
2.5 mg | INTRAVENOUS | 0 refills | Status: DC | PRN
Start: 2024-04-15 — End: 2024-04-15

## 2024-04-15 MED ORDER — LEVOTHYROXINE 25 MCG PO TAB
25 ug | Freq: Every day | ORAL | 0 refills | Status: DC
Start: 2024-04-15 — End: 2024-04-19
  Administered 2024-04-15 – 2024-04-19 (×5): 25 ug via ORAL

## 2024-04-15 MED ORDER — CHOLESTYRAMINE 4 GRAM PO PWPK
4 g | Freq: Every day | ORAL | 0 refills | Status: DC
Start: 2024-04-15 — End: 2024-04-19
  Administered 2024-04-15 – 2024-04-16 (×2): 4 g via ORAL

## 2024-04-15 MED ORDER — TRAZODONE 50 MG PO TAB
50 mg | Freq: Every evening | ORAL | 0 refills | Status: DC | PRN
Start: 2024-04-15 — End: 2024-04-19
  Administered 2024-04-18: 05:00:00 50 mg via ORAL

## 2024-04-15 MED ORDER — NON FORMULARY
Freq: Every day | ORAL | 0 refills | Status: DC
Start: 2024-04-15 — End: 2024-04-15

## 2024-04-15 MED ORDER — DIAZEPAM 5 MG/ML IJ SYRG
1 mg | INTRAVENOUS | 0 refills | Status: DC | PRN
Start: 2024-04-15 — End: 2024-04-15

## 2024-04-15 MED ORDER — DIPHENOXYLATE-ATROPINE 2.5-0.025 MG PO TAB
1 | Freq: Four times a day (QID) | ORAL | 0 refills | Status: DC | PRN
Start: 2024-04-15 — End: 2024-04-19

## 2024-04-15 MED ORDER — ESZOPICLONE 3 MG PO TAB
3 mg | Freq: Every evening | ORAL | 0 refills | Status: DC
Start: 2024-04-15 — End: 2024-04-19
  Administered 2024-04-16 – 2024-04-19 (×4): 3 mg via ORAL

## 2024-04-15 MED ORDER — INSULIN GLARGINE 100 UNIT/ML (3 ML) SC INJ PEN
20 [IU] | Freq: Two times a day (BID) | SUBCUTANEOUS | 0 refills | Status: DC
Start: 2024-04-15 — End: 2024-04-16
  Administered 2024-04-15: 14:00:00 20 [IU] via SUBCUTANEOUS

## 2024-04-15 MED ORDER — CALCIUM CARBONATE 200 MG CALCIUM (500 MG) PO CHEW
1000 mg | Freq: Three times a day (TID) | ORAL | 0 refills | Status: DC | PRN
Start: 2024-04-15 — End: 2024-04-19

## 2024-04-15 MED ORDER — NORTRIPTYLINE 25 MG PO CAP
50 mg | Freq: Every evening | ORAL | 0 refills | Status: DC
Start: 2024-04-15 — End: 2024-04-19
  Administered 2024-04-16 – 2024-04-19 (×4): 50 mg via ORAL

## 2024-04-15 MED ORDER — NICOTINE (POLACRILEX) 2 MG BU GUM
2 mg | BUCCAL | 0 refills | Status: DC | PRN
Start: 2024-04-15 — End: 2024-04-19

## 2024-04-15 MED ORDER — PANTOPRAZOLE 40 MG PO TBEC
40 mg | Freq: Two times a day (BID) | ORAL | 0 refills | Status: DC
Start: 2024-04-15 — End: 2024-04-19
  Administered 2024-04-16 – 2024-04-19 (×8): 40 mg via ORAL

## 2024-04-15 MED ORDER — ACETAMINOPHEN 325 MG PO TAB
650 mg | ORAL | 0 refills | Status: DC | PRN
Start: 2024-04-15 — End: 2024-04-17

## 2024-04-15 MED ORDER — HALOPERIDOL LACTATE 5 MG/ML IJ SOLN
5 mg | INTRAMUSCULAR | 0 refills | Status: DC | PRN
Start: 2024-04-15 — End: 2024-04-19

## 2024-04-15 MED ORDER — GLUCAGON HCL 1 MG/ML IJ SOLR
1 mg | SUBCUTANEOUS | 0 refills | Status: DC | PRN
Start: 2024-04-15 — End: 2024-04-19

## 2024-04-15 MED ORDER — CETIRIZINE 10 MG PO TAB
10 mg | Freq: Every day | ORAL | 0 refills | Status: DC
Start: 2024-04-15 — End: 2024-04-19
  Administered 2024-04-15 – 2024-04-19 (×5): 10 mg via ORAL

## 2024-04-15 MED ORDER — NON FORMULARY
Freq: Three times a day (TID) | ORAL | 0 refills | Status: DC
Start: 2024-04-15 — End: 2024-04-15

## 2024-04-15 MED ORDER — ICOSAPENT ETHYL 1 GRAM PO CAP
2 g | Freq: Two times a day (BID) | ORAL | 0 refills | Status: DC
Start: 2024-04-15 — End: 2024-04-19

## 2024-04-15 MED ORDER — DEXTROSE 50 % IN WATER (D50W) IV SYRG
12.5-25 g | INTRAVENOUS | 0 refills | Status: DC | PRN
Start: 2024-04-15 — End: 2024-04-15

## 2024-04-15 MED ORDER — VILAZODONE 20 MG PO TAB
40 mg | Freq: Every day | ORAL | 0 refills | Status: DC
Start: 2024-04-15 — End: 2024-04-19
  Administered 2024-04-15 – 2024-04-18 (×4): 40 mg via ORAL

## 2024-04-15 MED ORDER — DAPAGLIFLOZIN PROPANEDIOL 5 MG PO TAB
10 mg | Freq: Every morning | ORAL | 0 refills | Status: DC
Start: 2024-04-15 — End: 2024-04-19
  Administered 2024-04-15 – 2024-04-19 (×5): 10 mg via ORAL

## 2024-04-15 MED ORDER — MELATONIN 3 MG PO TAB
6 mg | Freq: Every evening | ORAL | 0 refills | Status: DC | PRN
Start: 2024-04-15 — End: 2024-04-19
  Administered 2024-04-17 – 2024-04-19 (×3): 6 mg via ORAL

## 2024-04-15 MED ORDER — FAMOTIDINE 20 MG PO TAB
20 mg | ORAL | 0 refills | Status: DC | PRN
Start: 2024-04-15 — End: 2024-04-15

## 2024-04-15 MED ORDER — CHOLESTYRAMINE 4 GRAM PO PWPK
4 g | Freq: Every day | ORAL | 0 refills | Status: DC
Start: 2024-04-15 — End: 2024-04-15

## 2024-04-15 MED ORDER — LIPASE-PROTEASE-AMYLASE (CREON 36,000) 36,000-114,000- 180,000 UNIT PO CPDR
3 | Freq: Three times a day (TID) | ORAL | 0 refills | Status: DC
Start: 2024-04-15 — End: 2024-04-19
  Administered 2024-04-15 – 2024-04-19 (×13): 3 via ORAL

## 2024-04-15 MED ORDER — HYDROXYZINE HCL 25 MG PO TAB
25 mg | ORAL | 0 refills | Status: DC | PRN
Start: 2024-04-15 — End: 2024-04-15

## 2024-04-15 MED ORDER — GLUCOSE 4 GRAM PO CHEW
16 g | ORAL | 0 refills | Status: DC | PRN
Start: 2024-04-15 — End: 2024-04-19

## 2024-04-15 MED ORDER — LORAZEPAM 1 MG PO TAB
1 mg | ORAL | 0 refills | Status: DC | PRN
Start: 2024-04-15 — End: 2024-04-17

## 2024-04-15 MED ORDER — HYDROXYZINE HCL 25 MG PO TAB
25 mg | ORAL | 0 refills | Status: DC | PRN
Start: 2024-04-15 — End: 2024-04-19
  Administered 2024-04-18: 05:00:00 25 mg via ORAL

## 2024-04-15 MED ORDER — ROSUVASTATIN 10 MG PO TAB
40 mg | ORAL | 0 refills | Status: DC
Start: 2024-04-15 — End: 2024-04-19
  Administered 2024-04-18: 02:00:00 40 mg via ORAL

## 2024-04-15 MED ORDER — INSULIN ASPART 100 UNIT/ML SC FLEXPEN
5 [IU] | Freq: Three times a day (TID) | SUBCUTANEOUS | 0 refills | Status: DC
Start: 2024-04-15 — End: 2024-04-16

## 2024-04-15 MED ORDER — LORAZEPAM 2 MG PO TAB
2 mg | ORAL | 0 refills | Status: DC | PRN
Start: 2024-04-15 — End: 2024-04-17

## 2024-04-15 MED ORDER — NICOTINE 21 MG/24 HR TD PT24
1 | Freq: Every day | TRANSDERMAL | 0 refills | Status: DC
Start: 2024-04-15 — End: 2024-04-19
  Administered 2024-04-16 – 2024-04-19 (×4): 1 via TRANSDERMAL

## 2024-04-15 NOTE — ED Notes [6]
 Was notified that Dr. Vicenta at [facility] accepted the pt for [facility].

## 2024-04-15 NOTE — Progress Notes [1]
 Pharmacy Note: Patients Own Med    The following medications have been identified by pharmacy and labeled for inpatient use. The patient may use their own supply of these medications during this admission. All doses should be administered and documented per hospital policy.    Medication: vascepa  Formulation: capsule  Strength: 1 gm  Quantity #: 65  Expiration date: 05/28/24  Lot number: [id number]  Pharmacy name and RX number:[id number]  Pharmacy Phone [phone]    Medication(s) were placed in POM bin in [place] cabinet.    Almarie Brunt, PHARMD

## 2024-04-15 NOTE — ED Notes [6]
 Presented pt to [facility]

## 2024-04-15 NOTE — Progress Notes [1]
 Pharmacy Nicotine Replacement Therapy  Pharmacy was consulted to assist with management of nicotine replacement therapy (NRT).    Alan [name] is a 43 y.o.male who reports the following home nicotine usage:  Tobacco Use History[1]   Vaping/E-liquid Use    Vaping Use Never User         Plan:  Nicotine Replacement Therapy Active IP Orders (From admission, onward)       Start     Ordered Stop    04/15/24 1316  nicotine polacrilex (NICORETTE) gum 2 mg  2 mg,   Buccal,   EVERY  2 HOURS PRN        Route Buccal  Dose 2 mg  Start 04/15/24 1316  End Until Discontinued  Last Admin --       04/15/24 1217 --    04/15/24 1300  nicotine (NICODERM CQ) 21 mg/day patch 1 patch  1 patch,   Transdermal,   DAILY        Route Transdermal  Dose 1 patch  Start 04/15/24 1300  End Until Discontinued  Last Admin --       04/15/24 1217 --                  Pharmacy will continue to monitor Alan [name] for signs and symptoms of nicotine withdrawal or excess and adjust nicotine replacement therapy accordingly.      Almarie Brunt, [place]  04/15/2024       [1]   Social History  Tobacco Use   Smoking Status Every Day    Current packs/day: 0.50    Average packs/day: 0.9 packs/day for 30.0 years (27.5 ttl pk-yrs)    Types: Cigarettes    Passive exposure: Current   Smokeless Tobacco Former    Types: Snuff, Chew    Quit date: 06/29/1997

## 2024-04-15 NOTE — Group Note [94]
 Name: Alan [name]   MRN: [id number]     DOB: Sep 27, 1980      Age: 43 y.o.  Admission Date: 04/14/2024     LOS: 0 days     Date of Service: 04/15/2024      Group Topic: BH Coping Skills  Group Date: 04/15/2024  Start Time: 1300  End Time: 1400  Facilitators: Pinky Dodrill          Number of Participants: 6  Group Focus: coping skills  Treatment Modality: Psychoeducation  Interventions utilized were exploration, group exercise, and patient education  Purpose: enhance coping skills    We did A-Z coping skills. Patients were educated on coping skills and stress management along with the importance of mastering these skills. We completed a group activity where we came up with coping skills for each letter of the alphabet and shared our answers at the end. Brief discussion questions were asked on the effectiveness of coping skills.           Name: [name] Date of Birth: 11/13/1980   MR: [id number]      Level of Participation: patient not present for group   Plan: to continue treatment

## 2024-04-15 NOTE — Care Plan [600008]
 Problem: Glucose Management  Goal: Absence of hyperglycemia  Flowsheets (Taken 04/15/2024 2236)  Absence of hyperglycemia: Administer pharmacological therapies as ordered  Goal: Absence of Hypoglycemia  Flowsheets (Taken 04/15/2024 2236)  Absence of hypoglycemia: Assess for signs and symptoms of hypoglycemia  Goal: Glucose level within specified parameters  Flowsheets (Taken 04/15/2024 2236)  Glucose level within specified parameters: Perform blood glucose monitoring as ordered and/or patient is symptomatic     Problem: Discharge Planning  Goal: Participation in plan of care  Flowsheets (Taken 04/15/2024 2236)  Participation in Plan of Care: Involve patient/caregiver in care planning decision making     Problem: Mood - Altered  Goal: Stabilize mood  Flowsheets (Taken 04/15/2024 2236)  Stabilize mood:   Assess coping style   Assess ineffective coping signs and symptoms   Assess depressive symptoms     Problem: Coping - Ineffective, Family  Goal: Effective Coping  Flowsheets (Taken 04/15/2024 2236)  Effective coping: Provide coping support

## 2024-04-15 NOTE — Group Note [94]
 Name: Alan [name]   MRN: [id number]     DOB: [birth year]      Age: 43 y.o.  Admission Date: 04/14/2024     LOS: 0 days     Date of Service: 04/15/2024      Group Topic: BH Loss  Group Date: 04/15/2024  Start Time: 1130  End Time: 1215  Facilitators: Pinky Dodrill          Number of Participants: 11  Group Focus: acceptance, coping skills, feeling awareness/expression, forgiveness, and loss/grief issues  Treatment Modality: Group Psychotherapy  Interventions utilized were exploration, patient education, reminiscence, and story telling  Purpose: enhance coping skills, explore maladaptive thinking, express feelings, and increase insight    Handouts were given on grief and loss. Information on the handout was read aloud and discussed. Questions were asked in regards to grief. Patient's were given grief worksheet to feel out and share.            Name: Alan [name] Date of Birth: July 06, 1980   MR: [id number]      Level of Participation: patient not present for group   Plan: to continue treatment

## 2024-04-15 NOTE — ED Notes [6]
 [facility] transport set up ETA 7am

## 2024-04-15 NOTE — Progress Notes [1]
 PSYCHIATRIC NURSING ADMISSION ASSESSMENT     NAME:Alan [name]             MRN: [id number]             DOB:[birth year]          AGE: 43 y.o.  ADMISSION DATE: 04/14/2024             DAYS ADMITTED: LOS: 0 days    Alan [name] 43 y.o. M presents to [facility] from   ED  via ambulance services, A&Ox4, cooperative but verbalizes frustration regarding being tried after spending the night in the ED. Offered empathy and reassurance. Searched for contraband by PD in [place], ambulated independently with steady even gait to intake room 4 , VS assessed and noted to be WNL. BG assessed as well, elevated at 267. Skin assessment completed by Gerardine LABOR, RN with intake staff as witness. Continuous glucose monitor removed. Bilateral great toe nails missing, pt report recently removed. No additional concerns noted     Contraband/Body Checks  Patient Searched: Yes  Belongings Searched: Yes  Head Lice Check: N/A (adult patient)    Reason for admission:  SI    Pt initially presented as a walk in yesterday evening with primary concerns of SI with plan to hit himself with a hammer. Given pts extensive medical hx he was sent out for medical clearance per provider request. Upon return today pt is denying SI, HI, and AVH. He does however admit to having bad thoughts and nightmares. Patient rates anxiety 9 on 0 - 10 scale and depression 10 on 0 - 10 scale.      Past Psych hx:  [facility]. Denies hx of suicide attempt.     ROS completed. C/o generalized body aches rated 7/10. Alternating diarrhea and constipation which pt reports as his baseline and likely related to crohn's disease. No additional concerns noted at time of admission      Living Situation:  With spouse     Home Meds:  Compliant     Substance Abuse   Denies drug and alcohol use. Smokes 1 PPD. Declines nicotine replacement         Mental Status Exam  Legal Status: Voluntary Admission  General Appearance: Avoids eye contact  Mood / Affect: Depressed mood  Speech: Excessive amount  Content Of Thought: Guarded  Motor Activity: Normal  Flow of Thought: Linear  Sensorium: Orientation to person, Orientation to place, Orientation to situation  Insight / Judgment: Poor judgment, Poor insight  Behavior: Calm, Combative, Follows commands  Patient Strengths: Access to housing/residential stability, Exercising self-direction    Vitals  BP: 117/74 (10/18 0700)  Temp: 36.5 ?C (97.7 ?F) (10/18 0700)  Pulse: 84 (10/18 0700)  Respirations: 18 PER MINUTE (10/18 0700)  SpO2: 99 % (10/18 0700)  O2 Device: None (Room air) (10/18 0700)  Height: 175.3 cm (5' 9) (10/18 0700)    Substance Abuse History:  Social History     Tobacco Use    Smoking status: Every Day     Current packs/day: 0.50     Average packs/day: 0.9 packs/day for 30.0 years (27.5 ttl pk-yrs)     Types: Cigarettes     Passive exposure: Current    Smokeless tobacco: Former     Types: Snuff, Chew     Quit date: 06/29/1997   Vaping Use    Vaping status: Never Used   Substance Use Topics    Alcohol use: Not Currently  Comment: Havent had a drink in three years    Drug use: Not Currently     Types: Marijuana       AUDIT-C  How Often Drink w/ Alcohol?: Never  # Drinks w/ Alcohol In Typical Day?: 0 drinks  How Often 6+ Drinks Per Occasion?: Never  AUDIT-C Total Score: 0    CAIGE AID  Have you ever felt you ought to cut down on your drinking or drug use?: No  Have people annoyed you by criticizing your drinking or drug use?: No  Have you felt bad or guilty about your drinking or drug use?: No  Have you ever had a drink or used drugs first thing in the morning to steady your nerves or to get rid of a hangover (eye-opener)?: No  Total Score: 0    Past Medical History:    Accidental fall    Acid reflux    Allergy    Anxiety    Blood in stool    Constipation    Crohn's disease (CMS-HCC)    Depression    Depressive disorder, not elsewhere classified    Essential hypertension    Fibromyalgia    Gastroparesis Generalized headaches    H/O peripheral neuropathy    Heartburn    Hyperlipidemia    Hypothyroid    Joint pain    Leukocytosis    Nausea    Polycythemia, secondary    Psychiatric illness    Stomach disorder    Type II diabetes mellitus (CMS-HCC)    Ulcer    Vision decreased       Surgical History:   Procedure Laterality Date    HX CHOLECYSTECTOMY  05/2019    Colonoscopy N/A 11/13/2019    Performed by Buckles, Toribio BROCKS, MD at [facility] OR    ESOPHAGOGASTRODUODENOSCOPY WITH BIOPSY - FLEXIBLE N/A 11/13/2019    Performed by Buckles, Toribio BROCKS, MD at [facility] OR    Colonoscopy N/A 12/31/2020    Performed by Buckles, Toribio BROCKS, MD at [facility] OR    ESOPHAGOGASTRODUODENOSCOPY WITH BIOPSY - FLEXIBLE N/A 12/31/2020    Performed by Buckles, Toribio BROCKS, MD at [facility] OR    BREATH HYDROGEN/ METHANE TESTING - LACTULOSE N/A 03/16/2022    Performed by Ronalee Pacific, MD at [facility] ENDO    ESOPHAGEAL MOTILITY STUDY N/A 03/16/2022    Performed by Ronalee Pacific, MD at [facility] ENDO    ESOPHAGOGASTRODUODENOSCOPY WITH DILATION GASTRIC/ DUODENAL STRICTURE - FLEXIBLE N/A 03/18/2022    Performed by Buckles, Toribio BROCKS, MD at [facility] ENDO    COLONOSCOPY DIAGNOSTIC WITH SPECIMEN COLLECTION BY BRUSHING/ WASHING - FLEXIBLE N/A 03/18/2022    Performed by Buckles, Toribio BROCKS, MD at [facility] ENDO    ESOPHAGOGASTRODUODENOSCOPY WITH BIOPSY - FLEXIBLE N/A 03/18/2022    Performed by Buckles, Toribio BROCKS, MD at [facility] ENDO    COLONOSCOPY WITH BIOPSY - FLEXIBLE  03/18/2022    Performed by Buckles, Toribio BROCKS, MD at [facility] ENDO    SIGMOIDOSCOPY DIAGNOSTIC WITH COLLECTION SPECIMEN BY BRUSHING/ WASHING - FLEXIBLE N/A 09/11/2022    Performed by Buckles, Toribio BROCKS, MD at [facility] OR    ESOPHAGOGASTRODUODENOSCOPY WITH BIOPSY - FLEXIBLE N/A 05/12/2023    Performed by Ronalee Pacific, MD at [facility] ENDO    ESOPHAGOGASTRODUODENOSCOPY WITH TRANSENDOSCOPIC ULTRASOUND GUIDED INTRAMURAL/ TRANSMURAL FINE NEEDLE ASPIRATION/ BIOPSY - FLEXIBLE N/A 06/02/2023    Performed by Verdis Anabel RAMAN, MD at [facility] ENDO    ENDOSCOPIC RETROGRADE CHOLANGIOPANCREATOGRAPHY WITH SPHINCTEROTOMY/ PAPILLOTOMY N/A 06/02/2023  Performed by Verdis Anabel RAMAN, MD at [facility] ENDO    ESOPHAGOGASTRODUODENOSCOPY, WITH BALLOON DILATION OF LESS THAN 30 MILLIMETERS  06/02/2023    Performed by Verdis Anabel RAMAN, MD at [facility] ENDO    ESOPHAGEAL MOTILITY STUDY N/A 08/10/2023    Performed by Ledora Catalina, MD at [facility] ENDO    ESOPHAGEAL BALLOON DISTENSION STUDY WITH/ WITHOUT PROVOCATION - DIAGNOSTIC N/A 08/10/2023    Performed by Ledora Catalina, MD at [facility] ENDO    ESOPHAGOGASTRODUODENOSCOPY, WITH BALLOON DILATION OF LESS THAN 30 MILLIMETERS N/A 08/10/2023    Performed by Ledora Catalina, MD at [facility] ENDO    ESOPHAGOGASTRODUODENOSCOPY WITH BIOPSY - FLEXIBLE N/A 08/10/2023    Performed by Ledora Catalina, MD at [facility] ENDO    ESOPHAGOGASTRODUODENOSCOPY WITH BIOPSY - FLEXIBLE N/A 10/11/2023    Performed by Verlon Lynwood SAUNDERS, MD at [facility] ENDO    ESOPHAGOGASTRODUODENOSCOPY WITH DILATION GASTRIC/ DUODENAL STRICTURE - FLEXIBLE N/A 10/11/2023    Performed by Verlon Lynwood SAUNDERS, MD at [facility] ENDO    BONE MARROW BIOPSY      COLONOSCOPY      COLONOSCOPY      HX SHOULDER SURGERY      HX VASECTOMY      LIVER BIOPSY         Medication reconciliation/preferred outpatient pharmacy:    [facility], [place] - [address]  Phone: [phone] Fax: [phone]    Prescriptions Prior to Admission[1]       [1]   Medications Prior to Admission   Medication Sig Dispense Refill Last Dose/Taking    ALPRAZolam  (XANAX ) 1 mg tablet Take one tablet by mouth as Needed.       chlordiazePOXIDE -clidinium (LIBRAX  (WITH CLIDINIUM)) 2.5/5 mg capsule Take one capsule by mouth three times daily. 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 dicyclomine  (BENTYL ) 20 mg tablet Take one tablet by mouth four times daily as needed. 360 tablet 1     diphenoxylate -atropine  (LOMOTIL ) 2.5-0.025 mg tablet TAKE 1 TABLET BY MOUTH FOUR TIMES A DAY AS NEEDED FOR DIARRHEA 60 tablet 0     ERGOcalciferoL  (vitamin D2) (DRISDOL ) 1,250 mcg (50,000 unit) capsule TAKE 1 CAPSULE BY MOUTH EVERY 7 DAYS FOR VITAMIN D  DEFICIENCY 12 capsule 0     eszopiclone (LUNESTA) 3 mg tablet Take one tablet by mouth at bedtime daily.       famotidine  (PEPCID ) 20 mg tablet Take one tablet by mouth as Needed.       HUMALOG KWIKPEN INSULIN  100 unit/mL subcutaneous PEN Inject four units under the skin three times daily with meals. Correct for blood sugar over 220. BG 221-260: give 2 units. BG 261-300 give 4 units. BG 301-350: give 6 units. 351-400: 8 units. >400: 10 units 15 mL 0     L-Methylfolate (DEPLIN 15) 15 mg tablet Take one tablet by mouth daily.       levothyroxine  (SYNTHROID ) 25 mcg tablet Take one tablet by mouth daily 30 minutes before breakfast.       linaCLOtide  (LINZESS ) 72 mcg capsule Take one capsule by mouth daily. 30 capsule 11     lipase -protease -amylase  (PANCREAZE ) 37,000-97,300- 149,900 unit capsule Take three capsules by mouth three  times daily with meals AND two capsules with snacks. Indications: exocrine pancreatic insufficiency 1500 capsule 3     NIFEDIPINE /LIDOCAINE  0.3%/1.5% IN PETROLATUM OINTMENT (BATCHED COMPOUND) Apply to anal area three times daily as needed 45 g 3     nortriptyline  (PAMELOR ) 50 mg capsule Take one capsule by mouth at bedtime daily. 30 capsule 0     ondansetron  (ZOFRAN  ODT) 4 mg rapid dissolve tablet Dissolve one tablet by mouth every 6 hours as needed. Place on tongue to dissolve.  Indications: nausea 30 tablet 0     pantoprazole  DR (PROTONIX ) 40 mg tablet Take one tablet by mouth twice daily. 180 tablet 3     risankizumab -rzaa (SKYRIZI ) 360 mg/2.4 mL (150 mg/mL) wearable injector Inject 2.4 mL under the skin every 8 weeks. Indications: Crohn's disease 2.4 mL 2     rosuvastatin  (CRESTOR ) 40 mg tablet Take one tablet by mouth twice weekly. Hold until follow up with hepatology  Indications: excessive fat in the blood       TRESIBA FLEXTOUCH U-200 200 unit/mL (3 mL) injection PEN thirty Units twice daily. Plus a Sliding Scale.       VASCEPA 1 gram capsule TAKE 2 CAPSULES BY MOUTH TWICE A DAY

## 2024-04-15 NOTE — Care Plan [600008]
 Collateral call made to patient's wife Alan [name], [phone]    Regarding events surrounding his admission, she reports that Alan [name] attended a follow-up appointment with his PCP on 10/17. When she returned from work, he told her that he was going to [facility] because his PCP recommended him to go.     She noted that he was up all night on Thursday 10/16. On Friday 10/17, he drove out of town to a store, and when he left the store, he didn't know where he was, possibly due to lack of sleep. After standing outside the store for a few minutes, he came to realize where he was. He then went to his PCP, who suggested that he go to [facility].    She did not notice any behaviors different from his baseline this past Thursday or Friday, though she notes that she is out of the house due to work Mon to [place], from 7am-3:30pm.     She notes that he has the same nightmare where he dreams he is killing people, and is scared about falling asleep due to the nightmare so he sometimes will stay up all night; this only happens 1 day at a time. In the past few weeks, he had been sleeping well until this week -- there were 2 nights where he stayed up all night (not consecutive). She notes that his mental health is worse after he has not slept for 24 hours, his mind will keep racing and not shut off, and that they have tried multiple sleep medications to target this.  He also wakes up 2-4 times a night to use the bathroom.  She has noticed an improvement in his sleep recently, after he was started on sleeping medication.  She is not sure which medication this is.    Regarding impulsivity, she notes that he is constantly spending money, and that they sometimes struggle to pay their bills due to his spending. They have packages arriving in the mail almost daily. She notes that when he was on [employer], spending worsened [place] the [place].     Regarding his mental health, she notes that he tends to hide it really well and that he does not talk to her about his mental health concerns. She reports that everything he has going on [medical condition, chronic pain] is messing with his head.  Prior to his Crohn's diagnosis and chronic pain, he did not struggle with depression, irritability, nightmares, or suicidal thoughts.  She said that he was a happy smiling guy, would crack jokes, but after chronic pain, he has been persistently irritable and down.    When directly asked about her thoughts if he were to return home from the hospital today, Alan endorses no safety concerns with herself, if he were to return home. She is unsure about his ability to maintain his own safety at home due to how little he tells her, and notes that she is unable to keep an eye on him all the time while she's working.  Their son who also lives with him works 9am-5pm, Tuesday to Saturday.     The firearm that was previously easily accessible has been sold. She does not know where the other firearms are.  Regarding sledgehammer's, she replies I am sure we have one, but I do not know how many we have or where they are kept. She does know they have collectible wooden hammers.     Regarding Xanax , she said he has told  her multiple times that he has taken multiple because they don't work. She cannot recall any medications that have been helpful for him in the past.    She then asked for advice on how to handle someone with depression. He gets frustrated at her when she does not ask him about his depression, but then also gets frustrated at her when she does ask questions.  She previously asked him about how he got a PTSD diagnosis.  Then yesterday, when he told her that his PCP recommended he go to [facility], he brought up how she questioned his PTSD diagnosis in the past.  She does not know how to engage with him about mental health topics.

## 2024-04-15 NOTE — H&P [4]
 PSYCHIATRY HISTORY & PHYSICAL EXAM       Room/Bed: DY6884/98    Admission Date:     04/14/2024                                                LOS: 0 days     ASSESSMENT & DIAGNOSIS     Alan [name] is a 43 y.o. Caucasian male with a history of Crohn's disease, IDDM, depression, anxiety, and benzodiazepine misuse who was sent to [facility] by his PCP and therapist for SI with a plan to kill myself with a sledgehammer. This appears to be precipitated by a nightmare where he fucking killed everyone that led to decreased sleep, a derealization episode, and taking 24 mg of Xanax . Factors that seem to have predisposed him to SI and decreased sleep include chronic pain from Crohn's disease. This current problem is maintained by benzodiazepine misuse, uncontrolled chronic pain, poorly controlled depression, poor coping skills, and poor social support. However, protective factors include consistent engagement with his therapist and PCP. Proposed treatment will consist of pharmacological therapy and group and individual therapy.    DSM-5 DIAGNOSES:  Chronic adjustment disorder with depressed mood  Benzodiazepine misuse  Chronic pain syndrome    MEDICAL:  Crohn's disease  Fibromyalgia  IDDM - poorly controlled  Gastroparesis  Pancreatitis  Migraine headaches  Hypothyroidism  GERD    PSYCHOSOCIAL STRESSORS:  Insomnia with benzodiazepine misuse  Financial stress from work  Medical complications secondary to Crohn's disease       PLAN     Admit to acute adult inpatient psychiatry unit for safety, stabilization, and monitoring.  mMinds protocol with PRN Ativan , for possible benzodiazepine withdrawal  Patient took 24 mg of Xanax  on 10/17.    Also on Librax  for Crohn's, which has Librium .  Change PTA Xanax  2 mg BID PRN to Klonopin  1.5 mg BID PRN  Was taking 1 Xanax  every morning, only needed 2nd dose 10% of the time  Continue other PTA psychotropics:  Viibryd 40 mg q. dinner for mood  Risperidone 2 mg nightly  Patient reports he is taking it for sleep  Pamelor  50 mg nightly for mood and neuropathy  Lunesta 3 mg nightly for sleep  As needed medications available for severe agitation.  Resuming other prior to arrival medications for general medical conditions:  Questran  daily  HLD: Vascepa BID, Crestor  twice weekly  Synthroid  every morning  Creon  with meals and snacks   Protonix  BID for GERD  For IDDM:   Farxiga  every morning  Glargine 20 units BID, Aspart 5 units TID AC, MDCF, POC BG ACHS  Bentyl  as needed  Lomotil  as needed  Encourage participation in ward milieu and therapies.  Admission labs reviewed:  WBC 12.4  Glucose 144  UDS positive for benzos  EKG reviewed: QTc 450. Normal sinus rhythm. No ST changes.  Will perform AIMS this admission.  Metabolic labs ordered for 04/17/24.  Spoke to wife April for collateral. Patient has provided verbal and written consent.    Disposition: Maintain admission for safety and stabilization.  Will likely go home post-discharge.    Seen and discussed with Dr. Vicenta.       CHIEF CONCERN     The patient presented with concern of ? I had a bad dream on Thursday where I fucking killed everyone, then I  got manic and wanted to kill myself with a sledgehammer.?       HISTORY OF PRESENT ILLNESS     Alan [name] is a 43 y.o. Caucasian male with a history of depression, anxiety, Crohn's disease, and fibromyalgia who initially presented to [facility] as a walk-in per recommendation of his PCP and therapist on 10/17 for SI with a plan to kill himself with a sledgehammer.  He was sent to ED for medical clearance before returning to [facility] on 10/18 for psychiatric admission.    On the night of 10/16, patient reported that he had a violent nightmare where he killed everyone.  After his nightmare, he was unable to fall back asleep.  On 10/17, he drove out of town to [employer] [place]  to buy supplies for work.  While in the store, he got manic and did not know where he was.  He also had suicidal thoughts to kill himself with a sledgehammer (reportedly has 6 sledgehammer's at home), and felt the urge to seek help.  He eventually was able to leave the store and returned home, where he told his wife and therapist about his suicidal thoughts.  When asked what their responses were to his SI, he says his therapist gave him a therapist response.  Regarding his wife, he notes if she cared about it, she did not show it.  At some point, he went to his PCPs office, and his PCP also recommended psychiatric admission after hearing about his suicidal thoughts.  At some point in the day on 10/17, he also took 12 pills of Xanax  2 mg, which  did not do anything and was not helpful.      Aside from nightmare on 10/16, there appears to be no other recent changes or triggers.  Patient reports adherence to his current medications.  He notes that he has had violent dreams for the past 2 years, where the violence is directed either towards him or towards other people.  He notes that he has even had dreams where people are beating him up, and he will wake up from his sleep and vomit.  His mental health issues started with his chronic pain due to his Crohn's and fibromyalgia.  Since the pain, he notes increased irritability and is worried about having outbursts.  Notes chronic irritability, nightmares, and difficulty sleeping for years.    Regarding sleep, patient also notes that he will commonly be awake for 48 to 72 hours at a time.  He drinks 3 to 4 44-oz regular Pepsi daily.  He cannot recall the last time he has not consumed daily caffeine.  Denies any increased irritability with his decreased sleep.  Endorses spending a lot of money for his job, as he has to regularly buy antiques and is unsure if he is able to resolve them, but does not notice any increase in spending with decree sleep.    He currently denies SI.  He notes that he was able to sleep while in the emergency room, and is feeling very different compared to yesterday.  He no longer thinks that he needs help with his mental health and is asking to discharge home.  He reports that his last suicidal thoughts was at 1 PM yesterday.    Denies HI, AVH.       PSYCHIATRIC REVIEW OF SYMPTOMS     Depression (5/9): The patient endorses depressed mood, anhedonia, feelings of guilt/worthlessness, and recurrent SI.  Also endorses chronic fatigue and  sleep difficulties, unsure if secondary to depression.    Mania (3+): The patient endorses irritability, decreased sleep for 1 to 2 days.  Also endorses spending money for his job, but denies other impulsivity.  Denies grandiosity, racing thoughts, increased goal-directed activity.    Psychosis: The patient denies    Anxiety (3/6): The patient endorses worries related to his business, muscle tension, irritability.  Otherwise denies    Panic: The patient endorses rare panic attacks, 1 every few months.    PTSD: The patient denies nightmares or flashbacks about past traumas.      Access to firearms?  Has 2 firearms at home that are in a box with many other items, they are not easy to get to    PAST PSYCHIATRIC HISTORY     Onset: Since 2021, after chronic neck pain, fibromyalgia from Crohn's disease  Outpatient Provider: Currently none, psychotropics are managed by his PCP.  Also sees a therapist Jacque Dennihan at [facility] psychiatric [facility]  Diagnoses: Patient reports depression, anxiety, and PTSD secondary to his medical condition.  Per chart review, MDD, GAD.  Psychiatric Hospitalizations: 1 time to [facility] in 2022 for MDD.  Pamelor  was increased to 50 mg.  Past Self-Injurious Behaviors: Denies  Past Suicide Attempts: Denies.  Denies any past preparatory behavior.    Current Psychiatric Medications:  Xanax  2 mg twice daily as needed (typically only takes 1 dose, every morning)  Risperidone 2 mg nightly - patient reports for sleep  The Laza Doan 40 mg q. lunch  Pamelor  50 mg nightly  Lunesta 3 mg nightly    Also on Librax  for Crohn's    Past Psychiatric Medication Trials & Responses:  Vraylar 1.5 mg daily - discontinued due to metabolic risks and lack of indication  Pristiq 100 mg daily  Klonopin  1 mg BID PRN  Rexulti - per wife, patient's financial habits significantly worsened and started gambling money away in casinos.  Zoloft  Wellbutrin  Ambien - sleepwalking per chart review  Abilify  Cymbalta      FAMILY PSYCHIATRIC HISTORY     Father attempted suicide.  Half brother and half sister (who share the same mom as patient) completed suicide. Their father also completed suicide.   Half brother: meth use  Maternal great grandpa: alcoholism      FAMILY MEDICAL HISTORY     Family History   Problem Relation Name Age of Onset    None Reported Mother      Cancer-Lung Maternal Uncle Uncle     Cancer Paternal Uncle      Cancer-Prostate Paternal Higinio Kemps     Cancer Maternal Grandmother Both grandfathers     Alcohol liver disease Maternal Grandfather Grandpa     Cancer Maternal Grandfather Grandpa     Cancer Paternal Grandfather All men on [place] side of family     Cancer Father Lei     Cancer Maternal Health Visitor          PAST MEDICAL AND SURGICAL HISTORY     Past Medical History:    Accidental fall    Acid reflux    Allergy    Anxiety    Blood in stool    Constipation    Crohn's disease (CMS-HCC)    Depression    Depressive disorder, not elsewhere classified    Essential hypertension    Fibromyalgia    Gastroparesis    Generalized headaches    H/O peripheral neuropathy    Heartburn  Hyperlipidemia    Hypothyroid    Joint pain    Leukocytosis    Nausea    Polycythemia, secondary    Psychiatric illness    Stomach disorder    Type II diabetes mellitus (CMS-HCC)    Ulcer    Vision decreased     Surgical History:   Procedure Laterality Date    HX CHOLECYSTECTOMY  05/2019    Colonoscopy N/A 11/13/2019    Performed by Buckles, Toribio BROCKS, MD at [facility] OR    ESOPHAGOGASTRODUODENOSCOPY WITH BIOPSY - FLEXIBLE N/A 11/13/2019    Performed by Buckles, Toribio BROCKS, MD at [facility] OR    Colonoscopy N/A 12/31/2020    Performed by Buckles, Toribio BROCKS, MD at [facility] OR    ESOPHAGOGASTRODUODENOSCOPY WITH BIOPSY - FLEXIBLE N/A 12/31/2020    Performed by Buckles, Toribio BROCKS, MD at [facility] OR    BREATH HYDROGEN/ METHANE TESTING - LACTULOSE N/A 03/16/2022    Performed by Ronalee Pacific, MD at [facility] ENDO    ESOPHAGEAL MOTILITY STUDY N/A 03/16/2022    Performed by Ronalee Pacific, MD at [facility] ENDO    ESOPHAGOGASTRODUODENOSCOPY WITH DILATION GASTRIC/ DUODENAL STRICTURE - FLEXIBLE N/A 03/18/2022    Performed by Buckles, Toribio BROCKS, MD at [facility] ENDO    COLONOSCOPY DIAGNOSTIC WITH SPECIMEN COLLECTION BY BRUSHING/ WASHING - FLEXIBLE N/A 03/18/2022    Performed by Buckles, Toribio BROCKS, MD at [facility] ENDO    ESOPHAGOGASTRODUODENOSCOPY WITH BIOPSY - FLEXIBLE N/A 03/18/2022    Performed by Buckles, Toribio BROCKS, MD at [facility] ENDO    COLONOSCOPY WITH BIOPSY - FLEXIBLE  03/18/2022    Performed by Buckles, Toribio BROCKS, MD at [facility] ENDO    SIGMOIDOSCOPY DIAGNOSTIC WITH COLLECTION SPECIMEN BY BRUSHING/ WASHING - FLEXIBLE N/A 09/11/2022    Performed by Buckles, Toribio BROCKS, MD at [facility] OR    ESOPHAGOGASTRODUODENOSCOPY WITH BIOPSY - FLEXIBLE N/A 05/12/2023    Performed by Ronalee Pacific, MD at [facility] ENDO    ESOPHAGOGASTRODUODENOSCOPY WITH TRANSENDOSCOPIC ULTRASOUND GUIDED INTRAMURAL/ TRANSMURAL FINE NEEDLE ASPIRATION/ BIOPSY - FLEXIBLE N/A 06/02/2023    Performed by Verdis Anabel RAMAN, MD at [facility] ENDO    ENDOSCOPIC RETROGRADE CHOLANGIOPANCREATOGRAPHY WITH SPHINCTEROTOMY/ PAPILLOTOMY N/A 06/02/2023    Performed by Verdis Anabel RAMAN, MD at [facility] ENDO    ESOPHAGOGASTRODUODENOSCOPY, WITH BALLOON DILATION OF LESS THAN 30 MILLIMETERS  06/02/2023    Performed by Verdis Anabel RAMAN, MD at [facility] ENDO    ESOPHAGEAL MOTILITY STUDY N/A 08/10/2023    Performed by Ledora Catalina, MD at [facility] ENDO    ESOPHAGEAL BALLOON DISTENSION STUDY WITH/ WITHOUT PROVOCATION - DIAGNOSTIC N/A 08/10/2023    Performed by Ledora Catalina, MD at [facility] ENDO ESOPHAGOGASTRODUODENOSCOPY, WITH BALLOON DILATION OF LESS THAN 30 MILLIMETERS N/A 08/10/2023    Performed by Ledora Catalina, MD at [facility] ENDO    ESOPHAGOGASTRODUODENOSCOPY WITH BIOPSY - FLEXIBLE N/A 08/10/2023    Performed by Ledora Catalina, MD at [facility] ENDO    ESOPHAGOGASTRODUODENOSCOPY WITH BIOPSY - FLEXIBLE N/A 10/11/2023    Performed by Verlon Lynwood SAUNDERS, MD at [facility] ENDO    ESOPHAGOGASTRODUODENOSCOPY WITH DILATION GASTRIC/ DUODENAL STRICTURE - FLEXIBLE N/A 10/11/2023    Performed by Verlon Lynwood SAUNDERS, MD at [facility] ENDO    BONE MARROW BIOPSY      COLONOSCOPY      COLONOSCOPY      HX SHOULDER SURGERY      HX VASECTOMY      LIVER BIOPSY         Home  Medications  Prescriptions Prior to Admission[1]    Hospital Medications  Scheduled Meds:cholestyramine -aspartame (QUESTRAN  LIGHT) 4 gram packet 4 g, 4 g, Oral, QDAY  dapagliflozin  propanediol (FARXIGA ) tablet 10 mg, 10 mg, Oral, QAM8  ERGOcalciferoL  (vitamin D2) (DRISDOL ) capsule 50,000 Units, 50,000 Units, Oral, Q7 Days  eszopiclone (LUNESTA) tablet 3 mg, 3 mg, Oral, QHS  icosapent ethyL (VASCEPA) capsule 2 g ++PATIENT OWN MED++, 2 g, Oral, BID  insulin  aspart (U-100) (NOVOLOG  FLEXPEN U-100 INSULIN ) injection PEN 0-12 Units, 0-12 Units, Subcutaneous, ACHS (22)  insulin  aspart (U-100) (NOVOLOG  FLEXPEN U-100 INSULIN ) injection PEN 5 Units, 5 Units, Subcutaneous, TID w/ meals  insulin  glargine (LANTUS  SOLOSTAR U-100 INSULIN ) injection PEN 20 Units, 20 Units, Subcutaneous, BID  levothyroxine  (SYNTHROID ) tablet 25 mcg, 25 mcg, Oral, QDAY 30 min before breakfast  lipase -protease -amylase  (pork) (CREON  36,000) capsule 3 capsule, 3 capsule, Oral, TID w/ meals   And  lipase -protease -amylase  (pork) (CREON  36,000) capsule 2 capsule, 2 capsule, Oral, w/Snacks  nicotine (NICODERM CQ) 21 mg/day patch 1 patch, 1 patch, Transdermal, QDAY  nortriptyline  (PAMELOR ) capsule 50 mg, 50 mg, Oral, QHS  pantoprazole  DR (PROTONIX ) tablet 40 mg, 40 mg, Oral, BID  risperiDONE (RisperDAL) tablet 2 mg, 2 mg, Oral, QHS  [START ON 04/17/2024] rosuvastatin  (CRESTOR ) tablet 40 mg, 40 mg, Oral, Once per day on Monday Thursday  vilazodone (VIIBRYD) tablet 40 mg, 40 mg, Oral, QDAY w/dinner    Continuous Infusions:  PRN and Respiratory Meds:acetaminophen  Q6H PRN, calcium  carbonate TID PRN, clonazePAM  BID PRN, dicyclomine  QID PRN, diphenoxylate -atropine  QID PRN, glucagon  PRN, glucose PRN, haloperidoL  Q6H PRN **OR** haloperidol  lactate Q6H PRN, hydrOXYzine  HCL Q6H PRN, LORazepam  Q4H PRN **OR** [DISCONTINUED] diazePAM Q4H PRN, LORazepam  Q4H PRN **OR** [DISCONTINUED] diazePAM Q4H PRN, melatonin QHS PRN, nicotine polacrilex Q2H PRN, ondansetron  Q6H PRN, polyethylene glycol 3350  QDAY PRN, traZODone  QHS PRN      Allergies  Iodinated contrast media, Iodine  and iodide containing products, Azathioprine, Chlorhexidine, Amoxicillin-pot clavulanate, Chlorphen-phenyleph-hydrocodon, Gadolinium-containing contrast media, and Oxycodone      SUBSTANCE USE     Caffeine: 3 to 4 44-oz regular Pepsi's daily since I was young  Tobacco: smokes 1 PPD   Alcohol: denies since pancreatitis diagnosis; previously up to 2-3 drinks a night, denies withdrawal symptoms, rehab, or detox  Marijuana: Denies  Cocaine: Denies  Heroin: Denies  Methamphetamines/Stimulants: Denies  Prescription opiates: Denies  Other: Denies  Attempts at rehabilitation: Denies      SOCIAL AND DEVELOPMENTAL HISTORY     Born and raised: [place]   Parents: Raised by his father, who went to prison 1 point and got married 3 times.  Childhood described as: I took what was going on with my dad and put it into sports  History of abuse/trauma: Father attempted suicide by played Russian Roulette with a gun in front of him when he was 43 years old.  Also reports that father with married multiple times to different women that were unstable.  Highest level of education: Finished high school;  I got a football scholarship for college but decided to work instead  Occupation/Employment: Conservator, Museum/gallery, works as an Radio Producer: Married to his wife April  Children: Has 1 son and 1 stepson (wife's son from a previous marriage)  Living Situation: Lives in [place] with his wife and son.  Legal: Denies significant legal issues  Social Supports: Nobody      Social History     Socioeconomic History    Marital status: Married   Tobacco Use  Smoking status: Every Day     Current packs/day: 0.50     Average packs/day: 0.9 packs/day for 30.0 years (27.5 ttl pk-yrs)     Types: Cigarettes     Passive exposure: Current    Smokeless tobacco: Former     Types: Snuff, Chew     Quit date: 06/29/1997   Vaping Use    Vaping status: Never Used   Substance and Sexual Activity    Alcohol use: Not Currently     Comment: Havent had a drink in three years    Drug use: Not Currently     Types: Marijuana    Sexual activity: Not Currently     Partners: Female     Birth control/protection: None         REVIEW OF SYSTEMS   A 14-point Review of Systems is obtained and is negative with the exception of the following:    Review of Systems   Gastrointestinal:  Positive for abdominal pain (chronic).   Psychiatric/Behavioral:  Positive for depression, substance abuse and suicidal ideas (yesterday). Negative for hallucinations. The patient has insomnia.            OBJECTIVE     Vital Signs:  Last Filed in 24 hours Vital Signs:  24 hour Range    BP: 117/74 (10/18 0700)  Temp: 36.5 ?C (97.7 ?F) (10/18 0700)  Pulse: 84 (10/18 0700)  Respirations: 18 PER MINUTE (10/18 0700)  SpO2: 99 % (10/18 0700)  O2 Device: None (Room air) (10/18 0700)  Height: 175.3 cm (5' 9) (10/18 0700) BP: (89-126)/(58-88)   Temp:  [36.5 ?C (97.7 ?F)-37.2 ?C (98.9 ?F)]   Pulse:  [78-99]   Respirations:  [18 PER MINUTE-22 PER MINUTE]   SpO2:  [91 %-99 %]   O2 Device: None (Room air)       MENTAL STATUS EXAMINATION   General/Constitutional: 43 y.o. male, appears stated age, in no acute distress  Behavior: Minimally cooperative, irritable  Speech/Motor: RRRTV/no notable PMA or PMR  Eye Contact: Fair  Mood: Wanting to go home  Affect: Dysthymic, guarded, restricted range, mood congruent  Thought Process: Linear, logical, goal directed  Associations: Intact  Thought Content: Denies SI today, endorses SI with a plan yesterday, denies HI  Perception: Denies AVH  Insight/Judgment: Fair/poor    Orientation: Grossly oriented  Recent and Remote Memory: Grossly intact  Attention span and concentration: Appropriate for conversation  Language: Fluency in [employer] of knowledge and vocabulary: Appears average      LABORATORY/RADIOLOGIC/OTHER TESTS   Pertinent labs reviewed, WBC 12.4, UDS positive for benzos.  ______________________________________________________________  Elijah Grice, MD          [1]   Medications Prior to Admission   Medication Sig Dispense Refill Last Dose/Taking    ALPRAZolam  (XANAX ) 2 mg tablet Take one tablet by mouth twice daily.       chlordiazePOXIDE -clidinium (LIBRAX  (WITH CLIDINIUM)) 2.5/5 mg capsule Take one capsule by mouth three times daily. 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       dicyclomine  (BENTYL ) 20 mg tablet Take one tablet by mouth four times daily as needed. 360 tablet 1     diphenoxylate -atropine  (  LOMOTIL ) 2.5-0.025 mg tablet TAKE 1 TABLET BY MOUTH FOUR TIMES A DAY AS NEEDED FOR DIARRHEA 60 tablet 0     ERGOcalciferoL  (vitamin D2) (DRISDOL ) 1,250 mcg (50,000 unit) capsule TAKE 1 CAPSULE BY MOUTH EVERY 7 DAYS FOR VITAMIN D  DEFICIENCY 12 capsule 0     eszopiclone (LUNESTA) 3 mg tablet Take one tablet by mouth at bedtime daily.       famotidine  (PEPCID ) 20 mg tablet Take one tablet by mouth as Needed.       insulin  lispro (U-100) (HUMALOG KWIKPEN INSULIN ) 100 unit/mL subcutaneous PEN Inject eighteen Units under the skin three times daily with meals.   Taking    L-Methylfolate (DEPLIN 15) 15 mg tablet Take one tablet by mouth daily.       levothyroxine  (SYNTHROID ) 25 mcg tablet Take one tablet by mouth daily 30 minutes before breakfast.       linaCLOtide  (LINZESS ) 72 mcg capsule Take one capsule by mouth daily. 30 capsule 11     lipase -protease -amylase  (PANCREAZE ) 37,000-97,300- 149,900 unit capsule Take three capsules by mouth three times daily with meals AND two capsules with snacks. Indications: exocrine pancreatic insufficiency 1500 capsule 3     NIFEDIPINE /LIDOCAINE  0.3%/1.5% IN PETROLATUM OINTMENT (BATCHED COMPOUND) Apply to anal area three times daily as needed 45 g 3     nortriptyline  (PAMELOR ) 50 mg capsule Take one capsule by mouth at bedtime daily. 30 capsule 0     ondansetron  (ZOFRAN  ODT) 4 mg rapid dissolve tablet Dissolve one tablet by mouth every 6 hours as needed. Place on tongue to dissolve.  Indications: nausea 30 tablet 0     pantoprazole  DR (PROTONIX ) 40 mg tablet Take one tablet by mouth twice daily. 180 tablet 3     risankizumab -rzaa (SKYRIZI ) 360 mg/2.4 mL (150 mg/mL) wearable injector Inject 2.4 mL under the skin every 8 weeks. Indications: Crohn's disease 2.4 mL 2     rosuvastatin  (CRESTOR ) 40 mg tablet Take one tablet by mouth twice weekly. Hold until follow up with hepatology  Indications: excessive fat in the blood       TRESIBA FLEXTOUCH U-200 200 unit/mL (3 mL) injection PEN Inject fifty Units under the skin at bedtime daily.       VASCEPA 1 gram capsule TAKE 2 CAPSULES BY MOUTH TWICE A DAY

## 2024-04-16 LAB — POC GLUCOSE
~~LOC~~ BKR POC GLUCOSE: 286 mg/dL — ABNORMAL HIGH (ref 70–100)
~~LOC~~ BKR POC GLUCOSE: 315 mg/dL — ABNORMAL HIGH (ref 70–100)
~~LOC~~ BKR POC GLUCOSE: 291 mg/dL — ABNORMAL HIGH (ref 70–100)
~~LOC~~ BKR POC GLUCOSE: 299 mg/dL — ABNORMAL HIGH (ref 70–100)
~~LOC~~ BKR POC GLUCOSE: 393 mg/dL — ABNORMAL HIGH (ref 70–100)
~~LOC~~ BKR POC GLUCOSE: 471 mg/dL — ABNORMAL HIGH (ref 70–100)

## 2024-04-16 MED ORDER — RISPERIDONE 1 MG PO TAB
1 mg | Freq: Every evening | ORAL | 0 refills | Status: DC
Start: 2024-04-16 — End: 2024-04-17
  Administered 2024-04-17: 02:00:00 1 mg via ORAL

## 2024-04-16 MED ORDER — INSULIN ASPART 100 UNIT/ML SC FLEXPEN
10 [IU] | Freq: Once | SUBCUTANEOUS | 0 refills | Status: CP
Start: 2024-04-16 — End: ?

## 2024-04-16 MED ORDER — INSULIN GLARGINE 100 UNIT/ML (3 ML) SC INJ PEN
25 [IU] | Freq: Two times a day (BID) | SUBCUTANEOUS | 0 refills | Status: DC
Start: 2024-04-16 — End: 2024-04-17

## 2024-04-16 MED ORDER — INSULIN ASPART 100 UNIT/ML SC FLEXPEN
15 [IU] | Freq: Three times a day (TID) | SUBCUTANEOUS | 0 refills | Status: DC
Start: 2024-04-16 — End: 2024-04-17

## 2024-04-16 MED ORDER — MIRTAZAPINE 15 MG PO TAB
7.5 mg | Freq: Every evening | ORAL | 0 refills | Status: DC
Start: 2024-04-16 — End: 2024-04-18
  Administered 2024-04-17 – 2024-04-18 (×2): 7.5 mg via ORAL

## 2024-04-16 NOTE — Group Note [94]
 Name: Alan [name]   MRN: [id number]     DOB: 31-Dec-1980      Age: 43 y.o.  Admission Date: 04/14/2024     LOS: 1 day     Date of Service: 04/16/2024      Group Topic: BH What is Mental Health and Mental Illness?  Group Date: 04/16/2024  Start Time: 1345  End Time: 1430  Facilitators: Melanee Herring          Number of Participants: 6  Group Focus: expressive therapies  Treatment Modality: Psychoeducation  Interventions utilized were patient education, Expressive Therapy Wheel of Fortune, and expressive therapy assessment  Purpose: enhance understanding of expressive therapy treatment    What is Expressive Therapy- This psychoeducation group focuses on helping patients gain a greater understanding of the process, goals, and outcomes of the expressive therapies. It provides a more in-depth exploration of the expressive modalities offered at [facility] (art, drama, and recreation), while providing space for patients to ask any questions that they might have and identifying their interests and goals (as they relate to expressive therapy groups). Patient engaged with group discussion, Expressive Therapy Wheel of Fortune, and an Expressive Therapy Assessment.   Name: Alan [name] Date of Birth: 1980/11/23   MR: [id number]      Level of Participation: patient not present for group   Plan: to continue treatment

## 2024-04-16 NOTE — Progress Notes [1]
 PSYCHIATRIC PROGRESS NOTE     LOS: 1 day  Voluntary/Involuntary Status: Voluntary  Guardian? None       MEDICATIONS   Current Psychotropic Medications:   Lunesta 3mg  QHS  Nortriptyline  50mg  QHS  Risperidone 1mg  QHS  Mirtazapine 7.5mg  QHS  Vilazodone 40mg  daily  Clonazepam  1.5mg  BID PRN   Hydroxyzine  25mg  PRN        ASSESSMENT & DIAGNOSES   Alan [name] is a 43 y.o. Caucasian male with a history of Crohn's disease, IDDM, depression, anxiety, and benzodiazepine misuse who was sent to [facility] by his PCP and therapist for SI with a plan to kill myself with a sledgehammer. This appears to be precipitated by a nightmare where he fucking killed everyone that led to decreased sleep, a derealization episode, and taking 24 mg of Xanax . Factors that seem to have predisposed him to SI and decreased sleep include chronic pain from Crohn's disease. This current problem is maintained by benzodiazepine misuse, uncontrolled chronic pain, poorly controlled depression, poor coping skills, and poor social support. However, protective factors include consistent engagement with his therapist and PCP. Proposed treatment will consist of pharmacological therapy and group and individual therapy.     DSM-5 DIAGNOSES:  Chronic adjustment disorder with depressed mood  Benzodiazepine misuse  Chronic pain syndrome    MEDICAL:  Crohn's disease  Fibromyalgia  IDDM - poorly controlled  Gastroparesis  Pancreatitis  Migraine headaches  Hypothyroidism  GERD    PSYCHOSOCIAL STRESSORS:  Insomnia with benzodiazepine misuse  Financial stress from work  Medical complications secondary to Crohn's disease     PLAN     Changes to psychotropic medications:   Will begin to taper off of Risperidone 2 mg Qhs (prescribed for sleep) in favor of Mirtazapine for sleep.   DECREASE risperidone to 1mg  tonight   Risperidone likely contributing to metabolic dysfunction (hypertriglyceridemia and insulin  dependent DMII) with no significant benefit noted by patient.   START Mirtazapine 7.5mg  QHS  Patient has never trialed this medication.   Continue Viibryd 40 mg q. dinner for mood  Continue Pamelor  50 mg nightly for mood and neuropathy  Continue Lunesta 3 mg nightly for sleep  Of note, has a history of sleep walking on Ambien.   Could consider discontinuing as this may be contributing to patient's distressing dreams   Continue Continue Klonopin  1.5 BID PRN for anxiety  PTA  was Xanax  2 mg BID. Transitioned to Klonopin  on admission for more long acting benzodiazepine.   Ideally, will gradually decrease dose of Klonopin .   Continue mMINDS protocol for additional day with PRN Ativan , for possibly benzodiazepine withdrawal  Patient took 24 mg of Xanax  on 10/17.    Also on Librax  for Crohn's, which has Librium .  As needed medications available for severe agitation.  Continue other prior to arrival medications for general medical conditions:  Questran  daily  HLD: Vascepa BID, Crestor  twice weekly  Synthroid  every morning  Creon  with meals and snacks   Protonix  BID for GERD  PTA Linzess  for constipation is not available while inpatient; Patient encouraged to utilize Miralax  while admitted. Monitor for constipation.   For IDDM:   Farxiga  every morning  Glargine 20 units BID, Aspart 5 units TID AC, MDCF, POC BG ACHS  Bentyl  as needed  Lomotil  as needed  Encourage participation in ward milieu and therapies.  Will perform AIMS this admission.          Metabolic labs completed on:  Lipid Panel:   Lab Results  Component Value Date    LDL 91 04/14/2024    LDL 74 06/05/2021     Lab Results   Component Value Date    HDL 32 (L) 04/14/2024    HDL 28 (L) 06/05/2021     Lab Results   Component Value Date    VLDL 112.6 04/14/2024    VLDL 52 06/05/2021     Lab Results   Component Value Date    CHOL 210 (H) 04/14/2024    CHOL 129 06/05/2021     Lab Results   Component Value Date    TRIG 563 (H) 04/14/2024    TRIG 261 (H) 06/05/2021       Blood Glucose:  Lab Results   Component Value Date HGBA1C 9.8 (H) 06/03/2023    HGBA1C 9.1 (H) 06/05/2021       Disposition: Maintain admission for safety and stabilization.  Will go to home upon discharge.    Alan [name], MS3  Seen and discussed with Dr. Vicenta.    I personally performed the key portions of the E/M visit, discussed case with medical student and concur with student's documentation of history, physical exam, assessment, and treatment plan unless otherwise noted.    Alan Jurist, MD      ______________________________________________________________     SUBJECTIVE     Group attendance? No. Patient states he did not attend group sessions yesterday and expressed little interest in attending today.     Mminds - Score 0-1.     No PRN medications administered over night. Took Klonopin  at noon today.     [name] was seen in his room this morning.     Patient denies SI today and reports his last SI was before falling asleep in the ER. He gave a detailed account of what he did on Friday 10/17. 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  He reports that he was standing in [place] lobby and could not figure out what he was supposed to do, feeling panicky, and the lady in front of him was taking forever.  All morning, he had been taking Xanax  4 mg at a time.  When he got out to the parking lot at [employer] he felt like he could not remember anything at all and did not know where he was.  He made it home, and started to work on his hobby, continuing to pop Xanax  to try to sleep it off.  He reports that at this time he contacted his wife and his therapist and let him know that he was very overwhelmed, and that he was starting to have suicidal thoughts.  His thought was that he would find the biggest sledgehammer he could find and hit his head with it.  He did not carry out the plan because he reports he did not think he would be able to knock himself out.  He had a previously scheduled primary care appointment at 3 PM, and also told this doctor about his overwhelmed feeling and SI.  His wife, therapist, and PCP all agreed that he should present to [facility].  Patient denies SI today.  He is agreeable to staying for a couple of days in order to get his medication regimen figured out.     He reports that he sleep well last night and experienced no nightmares, which  are typically what has triggered SI/HI in the past. Reviewed how often he experiences nightmares, sometimes 3 times a week and other times will be very intermittent and will avoid going to sleep due to nightmares. He is currently taking risperidone 2mg  and lunesta 3mg  for sleep. He reports he hasn't noticed much improvement in his sleep with risperidone. He reports he will occasionally skip dosages of his risperidone when he is very exhausted, but this is will typically only be one missed dose and not multiple days of missed dosages in a row. He is open to trying different medications.     He didn't attend any group therapy sessions yesterday as he was adjusting to being in the hospital but when asked if he would be attending today, he was hesitant. States he doesn't particularly enjoy being around others and prefers to be alone. When asked if he would be more interested in individual therapy sessions, states he is also not very interested in this option either. He reports he has a very good rapport with his therapist at home that he sees weekly and feels that seeing therapy individually here might interfere with his treatment with his home therapist. He reports his depression and anxiety as a 0/10.            REVIEW OF SYSTEMS   Review of Systems   Constitutional:  Negative for appetite change and fever.   Psychiatric/Behavioral:  Negative for agitation, behavioral problems, dysphoric mood, hallucinations and sleep disturbance.           OBJECTIVE Vital Signs:  Current                Vital Signs: 24 Hour Range   BP: 114/78 (10/19 1210)  Temp: 36.6 ?C (97.9 ?F) (10/19 9275)  Pulse: 88 (10/19 1210)  Respirations: 18 PER MINUTE (10/19 0724)  SpO2: 95 % (10/19 0724) BP: (112-123)/(69-84)   Temp:  [36.6 ?C (97.9 ?F)-36.8 ?C (98.3 ?F)]   Pulse:  [88-106]   Respirations:  [16 PER MINUTE-18 PER MINUTE]   SpO2:  [95 %]    Intensity Pain Scale (Self Report): (not recorded)      Sleep:  Hours of Sleep: 6.75   Quality of Sleep: Resting Quietly    Scheduled Medications:  cetirizine  (ZyrTEC ) tablet 10 mg, 10 mg, Oral, QDAY  cholestyramine -aspartame (QUESTRAN  LIGHT) 4 gram packet 4 g, 4 g, Oral, QDAY  dapagliflozin  propanediol (FARXIGA ) tablet 10 mg, 10 mg, Oral, QAM8  ERGOcalciferoL  (vitamin D2) (DRISDOL ) capsule 50,000 Units, 50,000 Units, Oral, Q7 Days  eszopiclone (LUNESTA) tablet 3 mg, 3 mg, Oral, QHS  icosapent ethyL (VASCEPA) capsule 2 g ++PATIENT OWN MED++, 2 g, Oral, BID  insulin  aspart (U-100) (NOVOLOG  FLEXPEN U-100 INSULIN ) injection PEN 0-12 Units, 0-12 Units, Subcutaneous, ACHS (22)  insulin  aspart (U-100) (NOVOLOG  FLEXPEN U-100 INSULIN ) injection PEN 5 Units, 5 Units, Subcutaneous, TID w/ meals  insulin  glargine (LANTUS  SOLOSTAR U-100 INSULIN ) injection PEN 20 Units, 20 Units, Subcutaneous, BID  levothyroxine  (SYNTHROID ) tablet 25 mcg, 25 mcg, Oral, QDAY 30 min before breakfast  lipase -protease -amylase  (pork) (CREON  36,000) capsule 3 capsule, 3 capsule, Oral, TID w/ meals   And  lipase -protease -amylase  (pork) (CREON  36,000) capsule 2 capsule, 2 capsule, Oral, w/Snacks  nicotine (NICODERM CQ) 21 mg/day patch 1 patch, 1 patch, Transdermal, QDAY  nortriptyline  (PAMELOR ) capsule 50 mg, 50 mg, Oral, QHS  pantoprazole  DR (PROTONIX ) tablet 40 mg, 40 mg, Oral, BID  risperiDONE (RisperDAL) tablet 2 mg, 2 mg, Oral, QHS  [START ON  04/17/2024] rosuvastatin  (CRESTOR ) tablet 40 mg, 40 mg, Oral, Once per day on Monday Thursday  vilazodone (VIIBRYD) tablet 40 mg, 40 mg, Oral, QDAY w/dinner        PRN Medications:  acetaminophen  Q6H PRN, calcium  carbonate TID PRN, clonazePAM  BID PRN 1.5 mg at 04/16/24 1207, dicyclomine  QID PRN, diphenoxylate -atropine  QID PRN, glucagon  PRN, glucose PRN, haloperidoL  Q6H PRN **OR** haloperidol  lactate Q6H PRN, hydrOXYzine  HCL Q6H PRN, LORazepam  Q4H PRN **OR** [DISCONTINUED] diazePAM Q4H PRN, LORazepam  Q4H PRN **OR** [DISCONTINUED] diazePAM Q4H PRN, melatonin QHS PRN, nicotine polacrilex Q2H PRN, ondansetron  Q6H PRN, polyethylene glycol 3350  QDAY PRN, traZODone  QHS PRN    Mental Status Exam:  General/Constitutional: 43 y.o. male, that appears his stated age. Well groomed and in hospital attire  Behavior: Overall pleasant and moderately cooperative  Speech: Normal rate, rhythm, and volume/no notable PMA or PMR  Eye Contact: good eye contact  Mood: good  Affect: Slightly guarded, becoming more open toward the end of interview.    Thought Process: Linear, logical, and goal orientated   Associations: Intact  Thought Content: Denies SI/HI today  Perception: Denies AVH  Insight/Judgment: Fair/Fair    Orientation: orientated to person, place, time, and situation  Recent and Remote Memory: Grossly intact  Attention span and concentration: Appropriate for conversation  Language: Normal and fluency in [employer] of knowledge and vocabulary: Average per conversation.      ______________________________________________________________  Alan [name]

## 2024-04-16 NOTE — Progress Notes [1]
 PRN Medication Administered:  Clonazepam      Non pharmacological interventions attempted prior to PRN medication administration:  Decrease stimuli    Brief narrative of reason for PRN medication administration: During nursing reassessment, patient endorsed anxiety at 6/10 and climbing related to generalized pain.  At the time of assessment, patient was observed sitting in group room 1 watching TV.  Patient declined offers of PRN pain medications because they don't do anything for me.  Patient was agreeable to take PRN medication for anxiety.  PRN clonazepam  given per orders.  Will continue to monitor for safety.

## 2024-04-16 NOTE — Group Note [94]
 Name: Alan [name]   MRN: [id number]     DOB: April 11, 1981      Age: 43 y.o.  Admission Date: 04/14/2024     LOS: 1 day     Date of Service: 04/16/2024      Group Topic: BH Cognitive Distortions  Group Date: 04/16/2024  Start Time: 1145  End Time: 1230  Facilitators: Helon Rao      Number of Participants: 6  Group Focus: acceptance, check in, clarity of thought, coping skills, daily focus, feeling awareness/expression, impulsivity, psychiatric education, and self-awareness  Treatment Modality: Cognitive Behavioral Therapy and Psychoeducation  Interventions utilized were assignment, clarification, exploration, and patient education  Purpose: enhance coping skills, explore maladaptive thinking, express feelings, express irrational fears, and increase insight    Topic: CBT Triangle and Cognitive Distortions  Implementation:  SW introduced the concept of the Cognitive Behavioral Therapy (CBT) triangle by asking the patient to describe their understanding of it and to share personal examples. Psychoeducation was provided to explain how thoughts, emotions, and behaviors are interconnected and influence one another in various situations.    Following this, the SW introduced a worksheet focused on cognitive distortions, explaining what they are and how they can impact perception and behavior. The patient was guided through identifying a recent situation in which a cognitive distortion may have occurred and worked through it step-by-step using a structured prompt.  Name: [name] Date of Birth: 13-Dec-1980   MR: [id number]      Level of Participation: patient not present for group   Plan: to continue treatment

## 2024-04-16 NOTE — Progress Notes [1]
 Glucose, POC   Date/Time Value Ref Range Status   04/16/2024 1347 471 (H) 70 - 100 mg/dL Final   89/80/7974 8841 393 (H) 70 - 100 mg/dL Final       RN provided education about diabetic diet and glucose management. Pt did not acknowledge understanding. He reports that he knows how to manage his blood sugars well at home. Pt is asymptomatic.       RN notified Elsie Roys with IM of ongoing hyperglycemia. One time order for additional 10 units of insulin  aspart to be administered (See MAR).

## 2024-04-16 NOTE — Progress Notes [1]
 PRN Medication Administered:  Klonopin  1.5 mg PO    Non pharmacological interventions attempted prior to PRN medication administration:  Sitting in TV room, therapeutic communication with RN, and breathing techniques.    Brief narrative of reason for PRN medication administration:   Pt reported elevated anxiety r/t not being able to watch football at noon on Sunday. Pt says his coping skills were ineffective, so first-line PRN for anxiety was administered per orders.

## 2024-04-16 NOTE — Group Note [94]
 Name: Alan [name]   MRN: [id number]     DOB: 03/30/1981      Age: 43 y.o.  Admission Date: 04/14/2024     LOS: 1 day     Date of Service: 04/16/2024      Group Topic: BH Leisure Activities  Group Date: 04/16/2024  Start Time: 1045  End Time: 1130  Facilitators: Melanee Herring          Number of Participants: 5  Group Focus: coping skills and leisure skills  Treatment Modality: Pet Therapy  Interventions utilized were interactions with therapy dog and pet therapy facilitator  Purpose: enhance coping skills    Pet Therapy- Patients participated in a pet therapy group in order to reduce stress and anxiety. They engaged with a therapy dog and discussed personal experiences with the pet therapy facilitator.     Name: [name] Date of Birth: Jul 07, 1980   MR: [id number]      Level of Participation: patient not present for group   Plan: to continue treatment

## 2024-04-16 NOTE — Care Plan [600008]
 Alan [name] denies anxiety, depression, SI/HI, and AVH.    Problem: Elopement Prevention  Goal: Patients without Decisional Capacity will not elope from the care setting  Outcome: Goal Ongoing     Problem: Harm to self, high risk of suicide  Goal: Absence of Harm to Self  Outcome: Goal Ongoing     Problem: Glucose Management  Goal: Absence of hyperglycemia  Outcome: Goal Ongoing  Goal: Absence of Hypoglycemia  Outcome: Goal Ongoing  Goal: Glucose level within specified parameters  Outcome: Goal Ongoing     Problem: Discharge Planning  Goal: Participation in plan of care  Outcome: Goal Ongoing  Goal: Knowledge regarding plan of care  Outcome: Goal Ongoing  Goal: Prepared for discharge  Outcome: Goal Ongoing     Problem: Health Maintenance - Impaired  Goal: Able to perfom ADL's  Outcome: Goal Ongoing  Goal: Adequate nutritional intake  Outcome: Goal Ongoing  Goal: Establish therapeutic relationships  Outcome: Goal Ongoing  Goal: Knowledge of health maintenance  Outcome: Goal Ongoing     Problem: Mood - Altered  Goal: Stabilize mood  Outcome: Goal Ongoing  Goal: Knowledge of Altered Mood  Outcome: Goal Ongoing     Problem: Self-esteem - Low  Goal: Demonstration of positive self-esteem  Outcome: Goal Ongoing  Goal: Knowledge of low self-esteem  Outcome: Goal Ongoing     Problem: Thought Process - Altered  Goal: Demonstration of organized thought processes  Outcome: Goal Ongoing  Goal: Knowledge of altered thought process  Outcome: Goal Ongoing     Problem: Violence, self/other-directed, Risk of  Goal: Absence of violence  Outcome: Goal Ongoing  Goal: Knowledge of risk for violence, self/other-directed  Outcome: Goal Ongoing     Problem: Transition Readiness  Goal: Knowledge of transition readiness  Outcome: Goal Ongoing     Problem: Cognitive-Perceptual Pattern - Impaired  Goal: Demonstration of organized thought processes  Outcome: Goal Ongoing  Goal: Knowledge of prescribed medication  Outcome: Goal Ongoing     Problem: Coping - Ineffective, Family  Goal: Effective Coping  Outcome: Goal Ongoing  Goal: Communicate with support staff  Outcome: Goal Ongoing  Goal: Knowledge of positive coping methods  Outcome: Goal Ongoing     Problem: Social Interaction - Impaired  Goal: Increased Social Interaction  Outcome: Goal Ongoing  Goal: Knowledge of social interaction  Outcome: Goal Ongoing     Problem: Moderate Fall Risk  Goal: Moderate Fall Risk  Outcome: Goal Ongoing

## 2024-04-16 NOTE — Progress Notes [1]
 Brief Progress Note    Reviewing Mr. Lembcke blood glucose, he remains very hyperglycemic with AM BG 299, prandials ~400-470.    His nurse reports poor compliance with dietary recommendations on the unit.    Recommendations:  > Will administer 10 units of extra aspart this afternoon (~14:00).  > Increase glargine to 25 units BID.  > Increase mealtime aspart to 15 units TID AC.  > Continue MDCF.      Elsie HERO. Douglass, M.D., Ph.D.  Internal Medicine & Psychiatry  Available on [employer]

## 2024-04-17 LAB — POC GLUCOSE
~~LOC~~ BKR POC GLUCOSE: 234 mg/dL — ABNORMAL HIGH (ref 70–100)
~~LOC~~ BKR POC GLUCOSE: 203 mg/dL — ABNORMAL HIGH (ref 70–100)
~~LOC~~ BKR POC GLUCOSE: 273 mg/dL — ABNORMAL HIGH (ref 70–100)
~~LOC~~ BKR POC GLUCOSE: 301 mg/dL — ABNORMAL HIGH (ref 70–100)

## 2024-04-17 MED ORDER — ACETAMINOPHEN 500 MG PO TAB
1000 mg | ORAL | 0 refills | Status: DC | PRN
Start: 2024-04-17 — End: 2024-04-19

## 2024-04-17 MED ORDER — INSULIN ASPART 100 UNIT/ML SC FLEXPEN
18 [IU] | Freq: Three times a day (TID) | SUBCUTANEOUS | 0 refills | Status: DC
Start: 2024-04-17 — End: 2024-04-17

## 2024-04-17 MED ORDER — INSULIN GLARGINE 100 UNIT/ML (3 ML) SC INJ PEN
20 [IU] | Freq: Every day | SUBCUTANEOUS | 0 refills | Status: DC
Start: 2024-04-17 — End: 2024-04-19

## 2024-04-17 MED ORDER — DOXAZOSIN 2 MG PO TAB
1 mg | Freq: Every evening | ORAL | 0 refills | Status: DC
Start: 2024-04-17 — End: 2024-04-18
  Administered 2024-04-18: 02:00:00 1 mg via ORAL

## 2024-04-17 MED ORDER — INSULIN GLARGINE 100 UNIT/ML (3 ML) SC INJ PEN
30 [IU] | Freq: Every evening | SUBCUTANEOUS | 0 refills | Status: DC
Start: 2024-04-17 — End: 2024-04-19

## 2024-04-17 MED ORDER — METHOCARBAMOL 500 MG PO TAB
750 mg | ORAL | 0 refills | Status: DC | PRN
Start: 2024-04-17 — End: 2024-04-19
  Administered 2024-04-17 – 2024-04-19 (×5): 750 mg via ORAL

## 2024-04-17 MED ORDER — OMEGA 3-DHA-EPA-FISH OIL 300-1,000 MG PO CPDR
1000 mg | Freq: Two times a day (BID) | ORAL | 0 refills | Status: DC
Start: 2024-04-17 — End: 2024-04-19
  Administered 2024-04-17 – 2024-04-19 (×4): 1000 mg via ORAL

## 2024-04-17 MED ORDER — INSULIN ASPART 100 UNIT/ML SC FLEXPEN
20 [IU] | Freq: Three times a day (TID) | SUBCUTANEOUS | 0 refills | Status: DC
Start: 2024-04-17 — End: 2024-04-19

## 2024-04-17 NOTE — Behavioral Health Treatment Team [[id number]]
 TREATMENT TEAM NOTE  Name: Alan [name]          MRN: [id number]              DOB: [birth year]          Age: 43 y.o.  Admission Date: 04/14/2024               Nursing: Marinell Barrio, RN  Therapy: Vinie Halls, [place], LCP  Case Manager: Marolyn Razor, LMSW  Clinical Manager: Junella Maya, LSCSW  Psychology: Carlos Barrio  Psychology: Diheng Dhang  UR: Duwaine Chang, LMSW  Psychiatry: Marsa Kluver, MD      Significant Points Discussed / Treatment Plan Discussion:  Patient admitted for suicidal thoughts, homicidal thoughts, and depressive symptoms.  Has established outpatient providers  Medication adjustments continue    Projected Discharge Date: TBD
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
## 2024-04-17 NOTE — Group Note [94]
 Name: Yerik Zeringue   MRN: [id number]     DOB: [birth year]      Age: 43 y.o.  Admission Date: 04/14/2024     LOS: 2 days     Date of Service: 04/17/2024      Group Topic: BH Emotion Regulation (DBT Skill)  Group Date: 04/17/2024  Start Time: 1400  End Time: 1500  Facilitators: Verne Kent          Number of Participants: 7  Group Focus: other DBT Distress Tolerance and Coping Skills   Treatment Modality: Dialectical Behavioral Therapy and Group Psychotherapy  Interventions utilized were clarification, exploration, patient education, and support  Purpose: enhance coping skills, increase insight, and reinforce self-care        Name: Marbin Date of Birth: May 16, 1981   MR: [id number]      Level of Participation: active   Quality of Participation: attentive, cooperative, engaged, initiates communication, offered feedback, and supportive  Interactions with others: gave feedback  Mood/Affect: appropriate  Triggers (if applicable): NA  Cognition: coherent/clear and logical  Progress: Significant  Response: Patient was active in the group discussion and supportive of his Peers  Plan: to continue treatment

## 2024-04-17 NOTE — Group Note [94]
 Name: Alan [name]   MRN: [id number]     DOB: 1980/07/04      Age: 43 y.o.  Admission Date: 04/14/2024     LOS: 2 days     Date of Service: 04/17/2024      Group Topic: BH Positive Problem Solving  Group Date: 04/17/2024  Start Time: 1300  End Time: 1400  Facilitators: Melanee Herring          Number of Participants: 7  Group Focus: coping skills and problem solving  Treatment Modality: Drama Therapy  Interventions utilized were Blackout Poetry  Purpose: enhance coping skills and enhance problem solving skills    Patient engaged in a Network Engineer activity, where they took an existing piece of text (i.e. book page, song lyric, news article) and created a poem by blacking out words to create a poem made from the remaining words. This activity focuses on creative problem solving, creative expression, and meaning making.     Name: Alan [name] Date of Birth: May 31, 1981   MR: [id number]      Level of Participation: moderate   Quality of Participation: attentive, cooperative, and engaged  Interactions with others: gave feedback and pulled from group for clinical purposes  Mood/Affect: appropriate  Cognition: coherent/clear  Progress: Moderate  Response: Pt was pulled for a majority of group but was engaged and fully participated in all group activities while present.  Plan: to continue treatment

## 2024-04-17 NOTE — Progress Notes [1]
 Psychotherapy Progress Note    NAME:Alan [name] MRN: [id number] DOB:[birth year] AGE: 43 y.o.  ADMISSION DATE: 04/14/2024 DAYS ADMITTED: LOS: 2 days    Date of Service:  04/17/24    Service Start Time: 1:19 pm  Service End Time: 1:49 pm    Principal Problem:    Adjustment disorder  Active Problems:    Crohn's disease involving terminal ileum (CMS-HCC)    Other chronic pancreatitis (CMS-HCC)    Suicidal ideation    Short-term Goal:  Encourage Deshannon to identify at least three early warning signs/red flags associated with depression, anxiety, hopelessness, impulsivity, and SI. Encourage him to identify at least three protective factors     Narrative:  This Therapist approached Patient in the Dayroom and he agreed to a session; it took place in the Art room.    Patient talked about feeling better overall.  He discussed his physical health challenges including Crohn's disease and Fibromyalgia.  He noted that he has been struggling with chronic pain for eight years.    Strengths include an established relationship with a therapist.  Patient and his wife have been married for 23 years; they have a 41 year old son.    Patient also talked about challenges related to his family.  A stepbrother and stepsister died by suicide.  Another stepbrother has attempted suicide several times and Patient's father attempted suicide in the past.  The relevance of these challenges and the importance of coping skills and early warning signs were discussed.    Notable MSE Observations: Eye contact WNL.  Affect full; mood congruent    Progress: Moderate progress towards therapeutic goals.    Follow up:  Treatment team to follow with patient and provide services.

## 2024-04-17 NOTE — Group Note [94]
 Name: Alan [name]   MRN: [id number]     DOB: [birth year]      Age: 43 y.o.  Admission Date: 04/14/2024     LOS: 2 days     Date of Service: 04/17/2024      Group Topic: BH Coping Skills  Group Date: 04/17/2024  Start Time: 0930  End Time: 1015  Facilitators: Con Pizza          Number of Participants: 6  Group Focus: coping skills  Treatment Modality: Art Therapy  Interventions utilized were assignment and exploration    Patients experimented with watercolor techniques to promote creative expression, emotion release, relaxation, and present moment awareness through engaging with the materials.        Name: Alan [name] Date of Birth: Jul 17, 1980   MR: [id number]      Level of Participation: active   Quality of Participation: attentive, cooperative, and engaged  Interactions with others: gave feedback  Mood/Affect: appropriate  Cognition: coherent/clear  Progress: Moderate  Response: Fully participated   Plan: to continue treatment
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
## 2024-04-17 NOTE — Progress Notes [1]
 PRN Medication Administered:  Clonazepam     Non pharmacological interventions attempted prior to PRN medication administration:  Decrease stimuli and Other: relaxation breathing techniques    Brief narrative of reason for PRN medication administration: Patient endorsed a sharp increase in anxiety at the bedtime tonight with an unknown stressor/trigger according to the patient.  Patient given a cold pack to place on back of his neck and patient was walked through square breathing as well.  Patient requested medication for anxiety also.  PRN clonazepam  given per orders.  Will continue to monitor for safety.

## 2024-04-18 ENCOUNTER — Encounter: Admit: 2024-04-18 | Discharge: 2024-04-18 | Payer: PRIVATE HEALTH INSURANCE

## 2024-04-18 LAB — POC GLUCOSE
~~LOC~~ BKR POC GLUCOSE: 209 mg/dL — ABNORMAL HIGH (ref 70–100)
~~LOC~~ BKR POC GLUCOSE: 172 mg/dL — ABNORMAL HIGH (ref 70–100)
~~LOC~~ BKR POC GLUCOSE: 204 mg/dL — ABNORMAL HIGH (ref 70–100)
~~LOC~~ BKR POC GLUCOSE: 261 mg/dL — ABNORMAL HIGH (ref 70–100)

## 2024-04-18 MED ORDER — MIRTAZAPINE 15 MG PO TAB
15 mg | Freq: Every evening | ORAL | 0 refills | Status: DC
Start: 2024-04-18 — End: 2024-04-19
  Administered 2024-04-19: 02:00:00 15 mg via ORAL

## 2024-04-18 NOTE — Group Note [94]
 Name: Miner Koral   MRN: [id number]     DOB: 1981/02/08      Age: 43 y.o.  Admission Date: 04/14/2024     LOS: 3 days     Date of Service: 04/18/2024      Group Topic: BH Coping Skills  Group Date: 04/18/2024  Start Time: 0945  End Time: 1035  Facilitators: Con Pizza          Number of Participants: 8  Group Focus: Healthy Activities  Treatment Modality: Art Therapy  Interventions utilized were assignment and exploration  Purpose: enhance coping skills    Group members were provided materials to create a tissue paper collage of a healthy activity or place that they enjoy.  Directive is intended to provide an opportunity for creative expression, coping skills, and healthy leisure interests.      Name: Jailan Date of Birth: February 18, 1981   MR: [id number]      Level of Participation: active   Quality of Participation: attentive, cooperative, and engaged  Interactions with others: gave feedback  Mood/Affect: appropriate  Cognition: coherent/clear  Progress: Moderate  Response: Fully participated   Plan: to continue treatment

## 2024-04-18 NOTE — Progress Notes [1]
 Case Management Progress Note    NAME:Alan [name] MRN: [id number] DOB:1980/10/12 AGE: 43 y.o.  ADMISSION DATE: 04/14/2024 DAYS ADMITTED: LOS: 3 days     Date of Service: 04/18/2024  Service Start Time:  2:00  Service End Time:  2:30    Met with the patient to check in and completed safety plan.  Patient was open to the discussion.  He presented calm and cooperative.  Patient was straightforward in his answers as he had identified his pain as the main issue related to his mental health.    Patient reported he will continue to see his therapist with [facility] and preferred to scheduled his on appointment.    Patient appeared ready to be going home and has plans in finding an answer regarding his chronic pain.

## 2024-04-18 NOTE — Progress Notes [1]
 Psychiatry Progress Note  Name: Alan [name]          MRN: [id number] DOB: Jan 14, 1981         Age: 43 y.o.  Admission Date: 04/14/2024  LOS: 3 days    Service: [facility]    Principal Problem:    PTSD (post-traumatic stress disorder)  Active Problems:    Crohn's disease involving terminal ileum (CMS-HCC)    Other chronic pancreatitis (CMS-HCC)    Suicidal ideation    Adjustment disorder    PLAN  Discontinue doxazosin due to concerns for worsening postural hypotension  Increase mirtazapine to 15 mg nightly for sleep and mood  Appreciate CM/SW assistance with providing EMDR resources  Anticipate discharge tomorrow     REASONS FOR CONTINUED HOSPITALIZATION  Psychiatric stabilization and medication management. Patient with decreased social support and at risk for recidivism if discharged at this time.   ______________________________________________________________  SUBJECTIVE  Patient seen for follow up today. Patient reports their mood is all right today.  States that he slept poorly and had a panic attack as he was trying to fall asleep last night.  States that it felt like his typical panic attacks that he was having PTA.  Denies any other concerns today.  Denies any medication side effects currently.  He does express some concern about possible dizziness/lightheadedness associate with doxazosin.  States that he does have a history of getting lightheaded including 1 episode where his wife found him in the yard after blacking out.  States that he would no longer like to take this medication.  He does state that overall he is feeling better and feels ready for discharge tomorrow.    REVIEW OF SYSTEMS  Review of Systems   Constitutional:  Negative for chills and fever.   HENT:  Negative for congestion.    Eyes:  Negative for visual disturbance.   Respiratory:  Negative for shortness of breath.    Cardiovascular:  Negative for chest pain.   Gastrointestinal:  Negative for abdominal pain.   Musculoskeletal:  Negative for back pain.   Neurological:  Negative for headaches.   Psychiatric/Behavioral:  Positive for sleep disturbance. Negative for hallucinations and suicidal ideas.      ______________________________________________________________  OBJECTIVE                 Vital Signs:  Current                Vital Signs: 24 Hour Range   BP: 118/85 (10/21 0722)  Temp: 36.6 ?C (97.8 ?F) (10/21 9277)  Pulse: 92 (10/21 0722)  Respirations: 18 PER MINUTE (10/21 0722)  SpO2: 98 % (10/21 0722) BP: (110-122)/(72-85)   Temp:  [36.3 ?C (97.3 ?F)-36.8 ?C (98.2 ?F)]   Pulse:  [81-97]   Respirations:  [16 PER MINUTE-18 PER MINUTE]   SpO2:  [97 %-98 %]    Intensity Pain Scale (Self Report): (not recorded)      Scheduled Medications:  cetirizine  (ZyrTEC ) tablet 10 mg, 10 mg, Oral, QDAY  cholestyramine -aspartame (QUESTRAN  LIGHT) 4 gram packet 4 g, 4 g, Oral, QDAY  dapagliflozin  propanediol (FARXIGA ) tablet 10 mg, 10 mg, Oral, QAM8  ERGOcalciferoL  (vitamin D2) (DRISDOL ) capsule 50,000 Units, 50,000 Units, Oral, Q7 Days  eszopiclone (LUNESTA) tablet 3 mg, 3 mg, Oral, QHS  fish oil - omega 3-DHA/EPA capsule 1,000 mg, 1,000 mg, Oral, BID w/meals  icosapent ethyL (VASCEPA) capsule 2 g ++PATIENT OWN MED++, 2 g, Oral, BID  insulin  aspart (U-100) (NOVOLOG  FLEXPEN U-100 INSULIN ) injection PEN  0-12 Units, 0-12 Units, Subcutaneous, ACHS (22)  insulin  aspart (U-100) (NOVOLOG  FLEXPEN U-100 INSULIN ) injection PEN 20 Units, 20 Units, Subcutaneous, TID w/ meals  insulin  glargine (LANTUS  SOLOSTAR U-100 INSULIN ) injection PEN 20 Units, 20 Units, Subcutaneous, QDAY  insulin  glargine (LANTUS  SOLOSTAR U-100 INSULIN ) injection PEN 30 Units, 30 Units, Subcutaneous, QHS  levothyroxine  (SYNTHROID ) tablet 25 mcg, 25 mcg, Oral, QDAY 30 min before breakfast  lipase -protease -amylase  (pork) (CREON  36,000) capsule 3 capsule, 3 capsule, Oral, TID w/ meals   And  lipase -protease -amylase  (pork) (CREON  36,000) capsule 2 capsule, 2 capsule, Oral, w/Snacks  mirtazapine (REMERON) tablet 15 mg, 15 mg, Oral, QHS  nicotine (NICODERM CQ) 21 mg/day patch 1 patch, 1 patch, Transdermal, QDAY  nortriptyline  (PAMELOR ) capsule 50 mg, 50 mg, Oral, QHS  pantoprazole  DR (PROTONIX ) tablet 40 mg, 40 mg, Oral, BID  rosuvastatin  (CRESTOR ) tablet 40 mg, 40 mg, Oral, Once per day on Monday Thursday  vilazodone (VIIBRYD) tablet 40 mg, 40 mg, Oral, QDAY w/dinner        PRN Medications:  acetaminophen  Q6H PRN, calcium  carbonate TID PRN, clonazePAM  BID PRN 1.5 mg at 04/18/24 0828, dicyclomine  QID PRN, diphenoxylate -atropine  QID PRN, glucagon  PRN, glucose PRN, haloperidoL  Q6H PRN **OR** haloperidol  lactate Q6H PRN, hydrOXYzine  HCL Q6H PRN 25 mg at 04/17/24 2348, melatonin QHS PRN 6 mg at 04/17/24 2237, methocarbamoL Q8H PRN 750 mg at 04/18/24 0810, nicotine polacrilex Q2H PRN, ondansetron  Q6H PRN, polyethylene glycol 3350  QDAY PRN 17 g at 04/17/24 1213, traZODone  QHS PRN 50 mg at 04/17/24 2348    Mental Status Exam:    General/Constitutional: appears stated age, no acute distress, dressed appropriately, in good grooming/hygiene  Speech: normal rate, rhythm, volume, and tone  Thought Process: linear and goal directed  Thought Content: Denies SI/HI  Perception: denies AH/VH  Associations: intact  Mood: All right  Affect: Euthymic  Insight/Judgement: Fair/fair    Orientation: grossly intact  Recent and remote memory: intact  Attention span and concentration: appropriate  Language: fluent, English speaker  [employer] of knowledge and vocabulary: appropriate    Focused Physical Exam:  Musculoskeletal: normal gait      ______________________________________________________________  Marsa KANDICE Kluver, MD

## 2024-04-18 NOTE — Safety Plan [8510031]
 Safety Plan   Name: Gracen Southwell          MRN: [id number]              DOB: 29-May-1981        Age: 42 y.o.  Admission Date: 04/14/2024           Step 1: WHAT I LOOK FORWARD TO IN MY FUTURE: Going home and managing my pain    Step 2: What things PROTECT / SUPPORT me? (loved ones, pets, faith, job, supportive people, fear of death): Being at home    Step 3: What are CONCERNS, STRESSORS, and TRIGGERS that lead to crisis? (situations, people, behaviors, places, memories, substances):  1. Feeling hopeless  2. Sleeping a lot more than usual   3. Feeling exhausted  4. Increased anxiety  5. Chronic pain    Step 4: What are my WARNING SIGNS of distress? (thoughts, images, moods, behaviors, memories, body sensations)  1. I get depressed  2. I don't want to do anything (stop working)  3. I just go to bed    Step 5: COPING STRATEGIES - What can I do on my own to reduce crisis and prevent myself from acting on my thoughts to harm myself or others?     1. Going to bed to cope with the pain      Step 6: What steps can I take to try to PREVENT A CRISIS? (How can I stay safe outside of the hospital?)  1. Continue to stay engaged with my therapist  2. Reach out for help when needed  3. Take medications as prescribed    Step 7: Among my SUPPORTS, whom can I reach out to for help during a crisis? Dietitian, Professional and [employer], etc.)   1. Call my therapist, Lonell      If a crisis develops, and/or I want to harm myself or others, and using the above safety plan is NOT EFFECTIVE, I agree to immediately do the following:   1. Either remove myself from the presence of weapons, medications, or other means of harming myself, or  ask a trusted friend/family member to remove such things from my home.   2. Call 911 or go the nearest hospital emergency room.    Crisis and Emotional Supports:   The Suicide & Crisis Lifeline- Text/ Call 988 - available 24/7/365  The Veterans Suicide Hotline [employer]) 24/7: 636-625-1130, press 1 or text to 432 767 2628 [employer] website & online chat: https://sampson.info/  Lesbian, Sel[place], Bisexual, Transgender, Queer/Questioning (LGBTQ) Suicide Hotline *The [facility] [place]* 24/7: text 'START' to 506-704-5243, call 713-804-5434; chat www.prizeandshine.co.uk  [employer] National Helpline: 1-800-662-HELP (581) 483-1458)  The Disaster Distress Helpline: 807-525-4191 or text TalkWithUs to 347-635-2795  Sexual assault hotline- 24/7: Call 306-654-9595   Domestic Violence hotline: 24/7: Call 1-800-799-SAFE 334-350-6439)   Warm Ear Hotline: Call (774)577-4835  Friendship Line for Senior Adults: Call 724-587-5245   NAMI Hotline: Call (782)837-2527 or go to http://namikansas.org

## 2024-04-18 NOTE — Care Plan [600008]
 Rates anxiety and depression as 5. Denies SI/HI/AVH. Reports anxious about possible discharge tomorrow.   Problem: Glucose Management  Goal: Absence of hyperglycemia  Outcome: Goal Ongoing  Flowsheets (Taken 04/18/2024 2326)  Absence of hyperglycemia:   Assess for sign and symptoms of hyperglycemia   Administer pharmacological therapies as ordered     Problem: Health Maintenance - Impaired  Goal: Able to perfom ADL's  Outcome: Goal Ongoing  Flowsheets (Taken 04/18/2024 2326)  Able to perform ADLs:   Assess ADL ability   Monitor body weight   Assess fluid volume   Assess nutritional intake   Assess sleep/rest     Problem: Mood - Altered  Goal: Stabilize mood  Outcome: Goal Ongoing  Flowsheets (Taken 04/18/2024 2326)  Stabilize mood: Assess depressive symptoms     Problem: Violence, self/other-directed, Risk of  Goal: Absence of violence  Outcome: Goal Ongoing  Flowsheets (Taken 04/18/2024 0031 by Dimas Kent, RN)  Absence of violence:   Suicide assessment   Environmental safety management   Supervise medication intake

## 2024-04-18 NOTE — Progress Notes [1]
 PRN Medication Administered:  Klonopin  1.5 mg PO  Non pharmacological interventions attempted prior to PRN medication administration:  Verbal de-escalation techniques, Decrease stimuli, and Increase structure and limit setting, talked 1: with him    Brief narrative of reason for PRN medication administration:   Norman c/o anxiety of 3 r/t being tired and just overall anxiety and given first line for anxiety.

## 2024-04-18 NOTE — Discharge Instructions [600000]
 Patient will discharge with family to his address in .  Discharge address is [address], [place], [place]  [address].  Case Management Discharge Instructions:  -Follow up with scheduled appointments as recommended.  -Take medications as prescribed  -Follow up with medication management as recommended.  -Utilize individual and group therapy as needed.  -Continue using daily coping skills.   -In case of crisis contact 911 or nearest hospital emergency room.      Appointment Information:    Patient will continue to see his therapist, Jacque Dennihan, MS, LCMFT with the [facility].    [facility]  [address]  [place], [place]  [address]  [phone]    Patient is also encouraged to follow at the [facility] for outpatient services.      [facility]  [facility]  [facility]  [address]  [place], [place] [address]  Phone: [phone]    [facility]  [facility]  [address]  [place], [place] [address]  Phone: [phone]    [facility]  [facility]  [address]  [place], [place] [address]  Phone: [phone]         Other Follow Up Information:  National Suicide Prevention Lifeline: (734) 772-9588 or you can text 988.   We can all help prevent suicide. The Lifeline provides 24/7, free and confidential support for people in distress, prevention and crisis resources for you or your loved ones, and best practices for professionals.      Crisis Text Line: Text HELLO or HOME to 98  Crisis Text Line serves anyone, in any type of crisis, providing access to free, 24/7 support and information via a medium people already use and trust: text.     Emergency Services are provided at the [facility] by clinical staff 24 hours per day, 7 days per week for those needing immediate attention.  After-hours services are available to clients and referral sources for consultation, problem resolution and, when needed, face-to-face intervention. To contact a member of the Emergency Services team:   During the day call: [phone]  After hours call: [phone]

## 2024-04-18 NOTE — Progress Notes [1]
 PRN Medication Administered:  Hydroxyzine      Non pharmacological interventions attempted prior to PRN medication administration:  Decrease stimuli and Other: cold pack and deep breathing    Brief narrative of reason for PRN medication administration: Patient again had complaint of increased anxiety even after receiving PRN clonazepam .  Patient still unable to verbalize a specific stressor.  PRN hydroxyzine  given per orders for increased anxiety.  Will continue to monitor for safety.

## 2024-04-18 NOTE — Behavioral Health Treatment Team [600081]
 TREATMENT TEAM NOTE  Name: Alan [name]          MRN: [id number]              DOB: [birth year]          Age: 43 y.o.  Admission Date: 04/14/2024               Attendees:  Nursing: Marinell Barrio, RN  Therapy: Vinie Halls, [facility], LCP  Case Manager: Marolyn Razor, LMSW  Clinical Manager: Junella Maya, LSCSW  Psychology: Carlos Barrio  Psychology: Diheng Dhang  UR: Duwaine Chang, LMSW     Significant Points Discussed / Treatment Plan Discussion:  Continue treatment.

## 2024-04-18 NOTE — Group Note [94]
 Name: Alan [name]   MRN: [id number]     DOB: 1980-08-07      [name]: 43 y.o.  Admission Date: 04/14/2024     LOS: 3 days     Date of Service: 04/18/2024      Group Topic: Tobacco Treatment  Group Date: 04/18/2024  Start Time: 1300  End Time: 1345  Facilitators: Waylon Doffing      Number of Participants: 4  Group Focus:  Health risks of tobacco, benefits of quitting, evidence-based treatment.  Treatment Modality: Education  Interventions utilized were:  discussed benefits of quitting, educated on treatment options. Depending on individual participant interest: set plans to quit, educated on how to obtain free/low-cost medications and counseling, developed coping skills, discussed craving management, chose options for stress management, discussed recognize danger situations and trigger management,    Purpose:  provide education and tools for tobacco cessation     Level of Participation: Tobacco Use Treatment Practical Counseling was provided in hospital (including recognizing danger situations, developing coping skills and providing basic information about quitting)  Quality of Participation: attentive, cooperative, and engaged  Response: Patient reports no interest in quitting smoking currently, reporting it is his only vice left right now. Patient requested switching from gum to lozenges to help manage cravings. Patient reports significant cravings currently.   Plan: accepted educational material, accepted smoking cessation medications, and MCA text program      Contact Information    If I can be of further assistance, please call [facility] [phone]

## 2024-04-18 NOTE — Care Plan [600008]
 Problem: Elopement Prevention  Goal: Patients without Decisional Capacity will not elope from the care setting  Outcome: Goal Ongoing     Problem: Harm to self, high risk of suicide  Goal: Absence of Harm to Self  Outcome: Goal Ongoing     Problem: Glucose Management  Goal: Absence of hyperglycemia  Outcome: Goal Ongoing  Goal: Absence of Hypoglycemia  Outcome: Goal Ongoing  Goal: Glucose level within specified parameters  Outcome: Goal Ongoing     Problem: Discharge Planning  Goal: Participation in plan of care  Outcome: Goal Ongoing  Goal: Knowledge regarding plan of care  Outcome: Goal Ongoing  Goal: Prepared for discharge  Outcome: Goal Ongoing     Problem: Health Maintenance - Impaired  Goal: Able to perfom ADL's  Outcome: Goal Ongoing  Goal: Adequate nutritional intake  Outcome: Goal Ongoing  Goal: Establish therapeutic relationships  Outcome: Goal Ongoing  Goal: Knowledge of health maintenance  Outcome: Goal Ongoing     Problem: Mood - Altered  Goal: Stabilize mood  Outcome: Goal Ongoing  Goal: Knowledge of Altered Mood  Outcome: Goal Ongoing     Problem: Self-esteem - Low  Goal: Demonstration of positive self-esteem  Outcome: Goal Ongoing  Goal: Knowledge of low self-esteem  Outcome: Goal Ongoing     Problem: Thought Process - Altered  Goal: Demonstration of organized thought processes  Outcome: Goal Ongoing  Goal: Knowledge of altered thought process  Outcome: Goal Ongoing     Problem: Violence, self/other-directed, Risk of  Goal: Absence of violence  Outcome: Goal Ongoing  Goal: Knowledge of risk for violence, self/other-directed  Outcome: Goal Ongoing     Problem: Transition Readiness  Goal: Knowledge of transition readiness  Outcome: Goal Ongoing     Problem: Cognitive-Perceptual Pattern - Impaired  Goal: Demonstration of organized thought processes  Outcome: Goal Ongoing  Goal: Knowledge of prescribed medication  Outcome: Goal Ongoing     Problem: Coping - Ineffective, Family  Goal: Effective Coping  Outcome: Goal Ongoing  Goal: Communicate with support staff  Outcome: Goal Ongoing  Goal: Knowledge of positive coping methods  Outcome: Goal Ongoing     Problem: Social Interaction - Impaired  Goal: Increased Social Interaction  Outcome: Goal Ongoing  Goal: Knowledge of social interaction  Outcome: Goal Ongoing     Problem: Moderate Fall Risk  Goal: Moderate Fall Risk  Outcome: Goal Ongoing     Problem: High Fall Risk  Goal: High Fall Risk  Outcome: Goal Ongoing     Problem: Pain  Goal: Management of pain  Outcome: Goal Ongoing  Goal: Knowledge of pain management  Outcome: Goal Ongoing  Goal: Progress Toward Pain Management Goals  Outcome: Goal Ongoing

## 2024-04-18 NOTE — Discharge Instr - Case Management [600072]
 Case Management Discharge Instructions:  -Follow up with scheduled appointments as recommended.  -Take medications as prescribed  -Follow up with medication management as recommended.  -Utilize individual and group therapy as needed.  -Continue using daily coping skills.   -In case of crisis contact 911 or nearest hospital emergency room.      Appointment Information:    Patient will continue to see his therapist, Jacque Dennihan, MS, LCMFT with the [facility].    [facility]  [address]  [place], [place]  [address]  [phone]    Patient is also encouraged to follow at the [facility] for outpatient services.      [facility]  [facility]  [facility]  [address]  [place], [place] [address]  Phone: [phone]    [facility]  [facility]  [address]  [place], [place] [address]  Phone: [phone]    [facility]  [facility]  [address]  [place], [place] [address]  Phone: [phone]         Other Follow Up Information:  National Suicide Prevention Lifeline: (276) 791-4927 or you can text 988.   We can all help prevent suicide. The Lifeline provides 24/7, free and confidential support for people in distress, prevention and crisis resources for you or your loved ones, and best practices for professionals.      Crisis Text Line: Text HELLO or HOME to 94  Crisis Text Line serves anyone, in any type of crisis, providing access to free, 24/7 support and information via a medium people already use and trust: text.     Emergency Services are provided at the [facility] by clinical staff 24 hours per day, 7 days per week for those needing immediate attention.  After-hours services are available to clients and referral sources for consultation, problem resolution and, when needed, face-to-face intervention. To contact a member of the Emergency Services team: During the day call: [phone]  After hours call: [phone]

## 2024-04-18 NOTE — Care Plan [600008]
 Tonight, patient has been medication compliant and cooperative with nursing staff without any disruptive, behavioral outbursts.  Patient has been visible visible in the milieu and appeared to interact well with peers.  During the initial reassessment, patient's affect appeared slightly blunted but stable and congruent with his stated mood.  Patient denied any and all depression, anxiety, agitation and SI/HI/AVH at the time.     Later in the evening, patient told nursing staff that he was having trouble getting to sleep and wanted a sleep aid.  Upon reassessment, patient also endorsed an increase in anxiety that just happened out of no-where and patient was not able to verbalize a specific stressor or thought that might have triggered him.  Patient's affect at the time appeared somewhat incongruent as evidenced by calm tone of voice, blunted facial expression, normal speech and even/unlabored breathing.  This clinical research associate provided patient with a cold pack and walked patient through a breathing exercise but patient still rated anxiety level at 8-9/10.  Patient was willing to try PRN medications to help with anxiety.  PRN medications given tonight have been melatonin, trazodone  for sleep aid and clonazepam  and hydroxyzine  for anxiety.  So far, there have been no other acute issues or events for this patient.  Will continue to monitor for safety.       Problem: Glucose Management  Goal: Absence of hyperglycemia  Outcome: Goal Ongoing  Flowsheets (Taken 04/18/2024 0026)  Absence of hyperglycemia:   Assess for sign and symptoms of hyperglycemia   Administer pharmacological therapies as ordered     Problem: Discharge Planning  Goal: Participation in plan of care  Outcome: Goal Ongoing  Flowsheets (Taken 04/18/2024 0031)  Participation in Plan of Care: Involve patient/caregiver in care planning decision making     Problem: Mood - Altered  Goal: Stabilize mood  Outcome: Goal Ongoing  Flowsheets (Taken 04/18/2024 0031)  Stabilize mood:   Assess ineffective coping signs and symptoms   Establish therapeutic relationships   Assess depressive symptoms     Problem: Violence, self/other-directed, Risk of  Goal: Absence of violence  Outcome: Goal Ongoing  Flowsheets (Taken 04/18/2024 0031)  Absence of violence:   Suicide assessment   Environmental safety management   Supervise medication intake     Problem: Social Interaction - Impaired  Goal: Increased Social Interaction  Outcome: Goal Ongoing  Flowsheets (Taken 04/18/2024 0031)  Increased social interaction:   Assess impaired social interaction signs and symptoms   Establish therapeutic relationships

## 2024-04-19 ENCOUNTER — Inpatient Hospital Stay: Admit: 2024-04-15 | Payer: PRIVATE HEALTH INSURANCE

## 2024-04-19 ENCOUNTER — Encounter: Admit: 2024-04-19 | Discharge: 2024-04-19 | Payer: PRIVATE HEALTH INSURANCE

## 2024-04-19 DIAGNOSIS — E1165 Type 2 diabetes mellitus with hyperglycemia: Secondary | ICD-10-CM

## 2024-04-19 DIAGNOSIS — Z659 Problem related to unspecified psychosocial circumstances: Secondary | ICD-10-CM

## 2024-04-19 DIAGNOSIS — Z79899 Other long term (current) drug therapy: Secondary | ICD-10-CM

## 2024-04-19 DIAGNOSIS — Z79890 Hormone replacement therapy (postmenopausal): Secondary | ICD-10-CM

## 2024-04-19 DIAGNOSIS — Z8042 Family history of malignant neoplasm of prostate: Secondary | ICD-10-CM

## 2024-04-19 DIAGNOSIS — K861 Other chronic pancreatitis: Secondary | ICD-10-CM

## 2024-04-19 DIAGNOSIS — G43909 Migraine, unspecified, not intractable, without status migrainosus: Secondary | ICD-10-CM

## 2024-04-19 DIAGNOSIS — Z9049 Acquired absence of other specified parts of digestive tract: Secondary | ICD-10-CM

## 2024-04-19 DIAGNOSIS — M797 Fibromyalgia: Secondary | ICD-10-CM

## 2024-04-19 DIAGNOSIS — Z794 Long term (current) use of insulin: Secondary | ICD-10-CM

## 2024-04-19 DIAGNOSIS — Z7984 Long term (current) use of oral hypoglycemic drugs: Secondary | ICD-10-CM

## 2024-04-19 DIAGNOSIS — K219 Gastro-esophageal reflux disease without esophagitis: Secondary | ICD-10-CM

## 2024-04-19 DIAGNOSIS — E039 Hypothyroidism, unspecified: Secondary | ICD-10-CM

## 2024-04-19 DIAGNOSIS — K3184 Gastroparesis: Secondary | ICD-10-CM

## 2024-04-19 DIAGNOSIS — E1143 Type 2 diabetes mellitus with diabetic autonomic (poly)neuropathy: Secondary | ICD-10-CM

## 2024-04-19 DIAGNOSIS — F4321 Adjustment disorder with depressed mood: Secondary | ICD-10-CM

## 2024-04-19 DIAGNOSIS — Z608 Other problems related to social environment: Secondary | ICD-10-CM

## 2024-04-19 DIAGNOSIS — I10 Essential (primary) hypertension: Secondary | ICD-10-CM

## 2024-04-19 DIAGNOSIS — F17210 Nicotine dependence, cigarettes, uncomplicated: Secondary | ICD-10-CM

## 2024-04-19 DIAGNOSIS — K5000 Crohn's disease of small intestine without complications: Secondary | ICD-10-CM

## 2024-04-19 DIAGNOSIS — G894 Chronic pain syndrome: Secondary | ICD-10-CM

## 2024-04-19 LAB — POC GLUCOSE
~~LOC~~ BKR POC GLUCOSE: 254 mg/dL — ABNORMAL HIGH (ref 70–100)
~~LOC~~ BKR POC GLUCOSE: 216 mg/dL — ABNORMAL HIGH (ref 70–100)

## 2024-04-19 MED ORDER — VILAZODONE 40 MG PO TAB
40 mg | ORAL_TABLET | Freq: Every day | ORAL | 0 refills | 30.00000 days | Status: AC
Start: 2024-04-19 — End: ?

## 2024-04-19 MED ORDER — NICOTINE (POLACRILEX) 4 MG BU GUM
4 mg | BUCCAL | 0 refills | 28.00000 days | Status: AC | PRN
Start: 2024-04-19 — End: ?

## 2024-04-19 MED ORDER — NICOTINE (POLACRILEX) 4 MG BU GUM
4 mg | BUCCAL | 0 refills | Status: DC | PRN
Start: 2024-04-19 — End: 2024-04-19

## 2024-04-19 MED ORDER — HYDROXYZINE HCL 25 MG PO TAB
25 mg | ORAL_TABLET | ORAL | 0 refills | 30.00000 days | Status: AC | PRN
Start: 2024-04-19 — End: ?
  Filled 2024-04-19: qty 30, 8d supply, fill #0

## 2024-04-19 MED ORDER — OMEGA 3-DHA-EPA-FISH OIL 300-1,000 MG PO CPDR
1000 mg | Freq: Two times a day (BID) | ORAL | 0 refills | 30.00000 days | Status: AC
Start: 2024-04-19 — End: ?

## 2024-04-19 MED ORDER — VILAZODONE 40 MG PO TAB
40 mg | ORAL_TABLET | Freq: Every day | ORAL | 0 refills | 30.00000 days | Status: DC
Start: 2024-04-19 — End: 2024-04-19

## 2024-04-19 MED ORDER — METHOCARBAMOL 750 MG PO TAB
750 mg | ORAL_TABLET | ORAL | 0 refills | 10.00000 days | Status: AC | PRN
Start: 2024-04-19 — End: ?
  Filled 2024-04-19: qty 10, 4d supply, fill #0

## 2024-04-19 MED ORDER — MIRTAZAPINE 15 MG PO TAB
15 mg | ORAL_TABLET | Freq: Every evening | ORAL | 0 refills | 30.00000 days | Status: AC
Start: 2024-04-19 — End: ?
  Filled 2024-04-19: qty 30, 30d supply, fill #0

## 2024-04-19 MED ORDER — NICOTINE 21 MG/24 HR TD PT24
1 | Freq: Every day | TRANSDERMAL | 0 refills | 28.00000 days | Status: AC
Start: 2024-04-19 — End: ?

## 2024-04-19 MED ORDER — CETIRIZINE 10 MG PO TAB
10 mg | Freq: Every day | ORAL | 0 refills | 30.00000 days | Status: AC
Start: 2024-04-19 — End: ?

## 2024-04-19 MED ORDER — INSULIN ASPART 100 UNIT/ML SC FLEXPEN
0-24 [IU] | Freq: Before meals | SUBCUTANEOUS | 0 refills | Status: DC
Start: 2024-04-19 — End: 2024-04-19

## 2024-04-19 MED ORDER — CLONAZEPAM 1 MG PO TAB
1.5 mg | ORAL_TABLET | Freq: Two times a day (BID) | ORAL | 0 refills | 30.00000 days | Status: AC | PRN
Start: 2024-04-19 — End: ?
  Filled 2024-04-19: qty 21, 7d supply, fill #0

## 2024-04-19 NOTE — Group Note [94]
 Name: Abhishek Levesque   MRN: [id number]     DOB: [birth year]      Age: 43 y.o.  Admission Date: 04/14/2024     LOS: 4 days     Date of Service: 04/19/2024      Group Topic: BH Stages of Change  Group Date: 04/19/2024  Start Time: 1000  End Time: 1100  Facilitators: Barrie Clem NOVAK, PhD          Number of Participants: 12  Group Focus: coping skills  Treatment Modality: Cognitive Behavioral Therapy  Interventions utilized were group exercise  Purpose: reinforce self-care        Name: Eldredge Date of Birth: [birth year]   MR: [id number]      Level of Participation: moderate   Quality of Participation: attentive and cooperative  Interactions with others: gave feedback  Mood/Affect: brightens with interaction  Triggers (if applicable): NA  Cognition: goal directed  Progress: Moderate  Response: Positive; Patient attentive and shared multiple comments during meeting.   Plan: to continue treatment

## 2024-04-19 NOTE — Behavioral Health Treatment Team [600081]
 TREATMENT TEAM NOTE  Name: Alan [name]          MRN: [id number]              DOB: June 21, 1981          Age: 43 y.o.  Admission Date: 04/14/2024               Attendees:  [x] Nursing: Marinell Barrio, RN  [x] Therapy: Vinie Halls, [facility], LCP  [x] Therapy: Camie Pitts, LMSW  [x] Case Manager: Corean Leigh, LMSW  [x] Psychology: Clem NOVAK. Barrie, PhD  [x] UR: Duwaine Chang, LMSW  [x] Psychiatry: Marsa Kluver, MD    Significant Points Discussed / Treatment Plan Discussion:      Projected Discharge Date: Today

## 2024-04-19 NOTE — Progress Notes [1]
 Psychotherapy Progress Note    NAME:Alan [name] MRN: [id number] DOB:Jul 12, 1980 AGE: 43 y.o.  ADMISSION DATE: 04/14/2024 DAYS ADMITTED: LOS: 4 days    Date of Service:  04/19/24    Principal Problem:    PTSD (post-traumatic stress disorder)  Active Problems:    Crohn's disease involving terminal ileum (CMS-HCC)    Other chronic pancreatitis (CMS-HCC)    Suicidal ideation    Adjustment disorder    Short-term Goal:  Encourage Alan [name] to identify and utilize at least three appropriate/adaptive coping strategies designed to reduce problematic symptoms     Narrative:  This Therapist approached Patient as he was sitting in the Dayroom.  He acknowledged he was going to discharge later today; his vehicle is parked at [facility].    Patient talked about trying to decide whether he should file again for disability.  Time was spent reviewing how easy it is to over do it physically and then pay a significant price.  Time was also spent talking about the importance of outpatient follow up care and medication compliance.  The session ended with a review of adaptive/appropriate short-term and intermediate plans.    Patient expressed appreciation for the services he received at [facility].    Notable MSE Observations: Eye contact WNL.  Affect full; mood congruent    Progress: Significant progress towards therapeutic goals.    Follow up:  No follow-up needed. Patient to discharge today and is encouraged to follow-up with aftercare providers.

## 2024-04-19 NOTE — Progress Notes [1]
 Discharge Note:      Condition    Patient's mood and thought contents have improved and are appropriate for discharge.      Disposition    Patient is leaving with self  (person) by car (mode of transportation) to home (place). All items from room, belonging bin, medication room, and safe are returned to the patient as applicable.      Discharge instructions    Discharge instructions and psychiatry summary of care were discussed and copy provided.   Patient expresses understanding.

## 2024-04-19 NOTE — Care Plan [600008]
 Problem: Elopement Prevention  Goal: Patients without Decisional Capacity will not elope from the care setting  Outcome: Goal Ongoing     Problem: Harm to self, high risk of suicide  Goal: Absence of Harm to Self  Outcome: Goal Ongoing     Problem: Glucose Management  Goal: Absence of hyperglycemia  Outcome: Goal Ongoing  Goal: Absence of Hypoglycemia  Outcome: Goal Ongoing  Goal: Glucose level within specified parameters  Outcome: Goal Ongoing     Problem: Discharge Planning  Goal: Participation in plan of care  Outcome: Goal Ongoing  Goal: Knowledge regarding plan of care  Outcome: Goal Ongoing  Goal: Prepared for discharge  Outcome: Goal Ongoing     Problem: Health Maintenance - Impaired  Goal: Able to perfom ADL's  Outcome: Goal Ongoing  Goal: Adequate nutritional intake  Outcome: Goal Ongoing  Goal: Establish therapeutic relationships  Outcome: Goal Ongoing  Goal: Knowledge of health maintenance  Outcome: Goal Ongoing     Problem: Mood - Altered  Goal: Stabilize mood  Outcome: Goal Ongoing  Goal: Knowledge of Altered Mood  Outcome: Goal Ongoing     Problem: Self-esteem - Low  Goal: Demonstration of positive self-esteem  Outcome: Goal Ongoing  Goal: Knowledge of low self-esteem  Outcome: Goal Ongoing     Problem: Thought Process - Altered  Goal: Demonstration of organized thought processes  Outcome: Goal Ongoing  Goal: Knowledge of altered thought process  Outcome: Goal Ongoing     Problem: Violence, self/other-directed, Risk of  Goal: Absence of violence  Outcome: Goal Ongoing  Goal: Knowledge of risk for violence, self/other-directed  Outcome: Goal Ongoing     Problem: Transition Readiness  Goal: Knowledge of transition readiness  Outcome: Goal Ongoing     Problem: Cognitive-Perceptual Pattern - Impaired  Goal: Demonstration of organized thought processes  Outcome: Goal Ongoing  Goal: Knowledge of prescribed medication  Outcome: Goal Ongoing     Problem: Coping - Ineffective, Family  Goal: Effective Coping  Outcome: Goal Ongoing  Goal: Communicate with support staff  Outcome: Goal Ongoing  Goal: Knowledge of positive coping methods  Outcome: Goal Ongoing     Problem: Social Interaction - Impaired  Goal: Increased Social Interaction  Outcome: Goal Ongoing  Goal: Knowledge of social interaction  Outcome: Goal Ongoing     Problem: Moderate Fall Risk  Goal: Moderate Fall Risk  Outcome: Goal Ongoing     Problem: High Fall Risk  Goal: High Fall Risk  Outcome: Goal Ongoing     Problem: Pain  Goal: Management of pain  Outcome: Goal Ongoing  Goal: Knowledge of pain management  Outcome: Goal Ongoing  Goal: Progress Toward Pain Management Goals  Outcome: Goal Ongoing     Problem: Tobacco Use  Goal: Knowledge of tobacco-use cessation methods  Outcome: Goal Ongoing

## 2024-04-19 NOTE — Discharge Summary [5]
 ATTESTATION    I personally performed or re-performed the history, physical exam and treatment for the E/M. I discussed the case with the medical student and concur with the medical student documentation of history, physical exam and treatment plan unless otherwise noted.     Staff name:  Marsa KANDICE Kluver, MD Date:  04/19/2024         Discharge Summary      Name: Alan [name]  Medical Record Number: [id number]        Account Number:  [id number]  Date Of Birth:  [birth year]                         Age:  43 y.o.  Admit date:  04/14/2024                     Discharge date: 04/19/2024      Discharge Attending:  Dr. Marsa Kluver  Discharge Summary Completed By: Moreen VEAR Mon    Service: [facility]    Reason for hospitalization:  Suicidal ideation [R45.851]    Primary Discharge Diagnosis:   PTSD (post-traumatic stress disorder)    Hospital Diagnoses:  Hospital Problems        Active Problems    * (Principal) PTSD (post-traumatic stress disorder)    Crohn's disease involving terminal ileum (CMS-HCC)    Other chronic pancreatitis (CMS-HCC)    Suicidal ideation    Adjustment disorder     Present on Admission:   Suicidal ideation   Crohn's disease involving terminal ileum (CMS-HCC)   Other chronic pancreatitis (CMS-HCC)        Significant Past Medical History        Accidental fall  Acid reflux  Allergy  Anxiety  Blood in stool  Constipation  Crohn's disease (CMS-HCC)  Depression  Depressive disorder, not elsewhere classified  Essential hypertension  Fibromyalgia  Gastroparesis  Generalized headaches  H/O peripheral neuropathy  Heartburn  Hyperlipidemia  Hypothyroid  Joint pain      Comment:  Im guessing  Leukocytosis  Nausea  Polycythemia, secondary  Psychiatric illness  Stomach disorder  Type II diabetes mellitus (CMS-HCC)  Ulcer  Vision decreased    Allergies   Iodinated contrast media, Iodine  and iodide containing products, Azathioprine, Chlorhexidine, Amoxicillin-pot clavulanate, Chlorphen-phenyleph-hydrocodon, Gadolinium-containing contrast media, and Oxycodone    Brief Hospital Course   Alan [name] is a 43 y.o. male with past psychiatric history of depression, anxiety, benzodiazepine misuse that presented to [facility] for suicidal ideation, initially presenting to the [facility] ED with a plan to kill himself with a sledgehammer    Patient was admitted and started on Mirtazapine 7.5mg  QHS for sleep and mood, and risperidone was discontinued. PTA Xanax  2mg  BID was transitioned to Klonopin  1.5mg  BID PRN on admission for a more long acting benzodiazepine. Patient tolerated this medication regimen well without any reported side effects. He was diagnosed with PTSD and a trial of doxazosin 1 mg for nightmares was initiated. Doxazosin was subsequently discontinued due to concerns for worsening postural hypotension and Mirtazapine was increased to 15mg  QHS. Patient participated in individual/group therapy and safety planning during admission. Patient noted improvement in mood and resolution in suicidal thoughts throughout the admission.  Patient was evaluated by Internal Medicine and followed throughout the admission. He will continue therapy with his established therapist at [facility].     The patient required: No room lockout, red zone, restraints, agitation PRN's, or other behavioral issues  The patient has: not exhausted therapeutic benefit from admission to this facility and future readmission would likely be appropriate if indicated    Day of discharge exam:  General/Constitutional: appears stated age, no acute distress, dressed appropriately, in good grooming/hygiene  Speech: normal rate, rhythm, volume, and tone  Thought Process: linear and goal directed  Thought Content: Denies SI/HI  Perception: denies AH/VH  Associations: intact  Mood: Good  Affect: Euthymic  Insight/Judgement: Fair/fair    Orientation: grossly intact  Recent and remote memory: intact  Attention span and concentration: appropriate  Language: fluent, English speaker  [employer] of knowledge and vocabulary: appropriate        Items Needing Follow Up   Pending items or areas that need to be addressed at follow up: n/a    Pending Labs and Follow Up Radiology    Pending labs and/or radiology review at this time of discharge are listed below: Please note- any labs with collected status will not have a result; if this area is blank, there are no items for review.         Medications        Medication List        START taking these medications      cetirizine  10 mg tablet  Commonly known as: ZyrTEC   Dose: 10 mg  Take one tablet by mouth daily. Indications: seasonal runny nose  For: seasonal runny nose  Refills: 0  Start taking on: April 20, 2024     clonazePAM  1 mg tablet  Commonly known as: KlonoPIN   Dose: 1.5 mg  Take 1.5 tablets by mouth twice daily as needed. Indications: anxiety  For: anxiety  Quantity: 21 tablet  Refills: 0     fish oil - omega 3-DHA/EPA 300/1,000 mg capsule  Dose: 1,000 mg  Take one capsule by mouth twice daily with meals. Indications: high amount of triglyceride in the blood  For: high amount of triglyceride in the blood  Refills: 0     hydrOXYzine  HCL 25 mg tablet  Commonly known as: ATARAX   Dose: 25 mg  Take one tablet by mouth every 6 hours as needed. Indications: anxious  For: anxious  Quantity: 30 tablet  Refills: 0     methocarbamoL 750 mg tablet  Commonly known as: ROBAXIN  Dose: 750 mg  Take one tablet by mouth every 8 hours as needed for Muscle Cramps or Spasms. Indications: muscle spasm  For: muscle spasm  Quantity: 10 tablet  Refills: 0     mirtazapine 15 mg tablet  Commonly known as: REMERON  Dose: 15 mg  Take one tablet by mouth at bedtime daily. Indications: posttraumatic stress syndrome  For: posttraumatic stress syndrome  Quantity: 30 tablet  Refills: 0     nicotine 21 mg/day patch  Commonly known as: NICODERM CQ  Dose: 1 patch  Apply one patch to top of skin as directed daily. Rotate patch location. Indications: stop smoking  For: stop smoking  Refills: 0  Start taking on: April 20, 2024     nicotine polacrilex 4 mg gum  Commonly known as: NICORETTE  Dose: 4 mg  Place one Gum inside cheek (side of mouth) every 2 hours as needed. Chew to soften and park in mouth between lip and gum. May use 1 piece per hour, not to exceed 24 per day for 12 weeks. May be used longer, if needed.  Indications: stop smoking  For: stop smoking  Refills: 0     vilazodone 40 mg tablet  Commonly known as: VIIBRYD  Dose: 40 mg  Take one tablet by mouth daily with dinner. Indications: major depressive disorder  For: major depressive disorder  Quantity: 30 tablet  Refills: 0            CONTINUE taking these medications      CHOLESTYRAMINE  LIGHT 4 gram powder  Generic drug: cholestyramine   Doctor's comments: Please send a replace/new response with 90-Day Supply if appropriate to maximize member benefit. Requesting 1 year supply.  MIX HALF A SCOOPFUL IN AT LEAST  2 TO 3 OUNCES LIQUID AND DRINK  DAILY FOR CHRONIC DIARRHEA DUE  TO BILE ACID MALABSORPTION  Quantity: 1440 g  Refills: 0     dapagliflozin  propanediol 10 mg tablet  Commonly known as: FARXIGA   Dose: 10 mg  Take one tablet by mouth every morning.  Refills: 0     dicyclomine  20 mg tablet  Commonly known as: BENTYL   Dose: 20 mg  Take one tablet by mouth four times daily as needed.  For diagnoses: Abdominal pain, unspecified abdominal location, Abdominal cramping  Quantity: 360 tablet  Refills: 1     diphenoxylate -atropine  2.5-0.025 mg tablet  Commonly known as: LOMOTIL   Dose: 1 tablet  Doctor's comments: Not to exceed 5 additional fills before 12/06/2022  TAKE 1 TABLET BY MOUTH FOUR TIMES A DAY AS NEEDED FOR DIARRHEA  For diagnoses: Diarrhea, unspecified type  Quantity: 60 tablet  Refills: 0     ERGOcalciferoL  (vitamin D2) 1,250 mcg (50,000 unit) capsule  Commonly known as: DRISDOL   TAKE 1 CAPSULE BY MOUTH EVERY 7 DAYS FOR VITAMIN D  DEFICIENCY  For diagnoses: Vitamin D  deficiency  Quantity: 12 capsule  Refills: 0     eszopiclone 3 mg tablet  Commonly known as: LUNESTA  Dose: 3 mg  Take one tablet by mouth at bedtime daily.  Refills: 0     famotidine  20 mg tablet  Commonly known as: PEPCID   Dose: 20 mg  Take one tablet by mouth as Needed.  Refills: 0     HumaLOG KwikPen Insulin  100 unit/mL subcutaneous PEN  Generic drug: insulin  lispro (U-100)  Dose: 18 Units  Inject eighteen Units under the skin three times daily with meals.  Refills: 0     L-Methylfolate 15 mg tablet  Commonly known as: DEPLIN 15  Dose: 15 mg  Take one tablet by mouth daily.  Refills: 0     levothyroxine  25 mcg tablet  Commonly known as: SYNTHROID   Dose: 25 mcg  Take one tablet by mouth daily 30 minutes before breakfast.  Refills: 0     LINZESS  72 mcg capsule  Generic drug: linaCLOtide   Dose: 72 mcg  Take one capsule by mouth daily.  For diagnoses: Constipation, unspecified constipation type  Quantity: 30 capsule  Refills: 11     NIFEDIPINE /LIDOCAINE  0.3%/1.5% IN PETROLATUM OINTMENT (BATCHED COMPOUND)  Doctor's comments: updated to pre-batched qty 60g  Apply to anal area three times daily as needed  For diagnoses: Anal fissure  Quantity: 45 g  Refills: 3     nortriptyline  50 mg capsule  Commonly known as: PAMELOR   Dose: 50 mg  Take one capsule by mouth at bedtime daily.  Quantity: 30 capsule  Refills: 0     ondansetron  4 mg rapid dissolve tablet  Commonly known as: ZOFRAN  ODT  Dose: 4 mg  Dissolve one tablet by mouth every 6 hours as needed. Place on tongue to dissolve.  Indications: nausea  For: nausea  Quantity: 30 tablet  Refills:  0     PANCREAZE  37,000-97,300- 149,900 unit capsule  Generic drug: lipase -protease -amylase  (pork)  Doctor's comments: Please allow for two snacks a day  Take three capsules by mouth three times daily with meals AND two capsules with snacks. Indications: exocrine pancreatic insufficiency  For: exocrine pancreatic insufficiency  For diagnoses: Crohn's disease involving terminal ileum (CMS-HCC), Diarrhea, unspecified type  Quantity: 1500 capsule  Refills: 3     pantoprazole  DR 40 mg tablet  Commonly known as: PROTONIX   Dose: 40 mg  Take one tablet by mouth twice daily.  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 Hold until follow up with hepatology  Indications: excessive fat in the blood  For: excessive fat in the blood  Refills: 0     SKYRIZI  360 mg/2.4 mL (150 mg/mL) wearable injector  Generic drug: risankizumab -rzaa  Dose: 360 mg  Inject 2.4 mL under the skin every 8 weeks. Indications: Crohn's disease  For: Crohn's disease  For diagnoses: Crohn's disease involving terminal ileum (CMS-HCC)  Quantity: 2.4 mL  Refills: 2     TRESIBA FLEXTOUCH U-200 200 unit/mL (3 mL) suncutaneous PEN  Generic drug: insulin  degludec  Dose: 50 Units  Inject fifty Units under the skin at bedtime daily.  Refills: 0     VASCEPA 1 gram capsule  Generic drug: icosapent ethyL  TAKE 2 CAPSULES BY MOUTH TWICE A DAY  Refills: 0            STOP taking these medications      ALPRAZolam  2 mg tablet  Commonly known as: XANAX                Where to Get Your Medications        These medications were sent to [id number] - [place], [place] - [address]  [address], [place] [address]      Phone: [phone]   vilazodone 40 mg tablet       These medications were sent to [facility]  [address] [place]  [place] [address]      Hours: Monday-Friday 8 a.m.-6 p.m. Saturday-Sunday 8 a.m.-4 p.m. PHONE: [phone] Phone: [phone]   clonazePAM  1 mg tablet  hydrOXYzine  HCL 25 mg tablet  methocarbamoL 750 mg tablet  mirtazapine 15 mg tablet       You can get these medications from any pharmacy    You don't need a prescription for these medications  cetirizine  10 mg tablet  fish oil - omega 3-DHA/EPA 300/1,000 mg capsule  nicotine 21 mg/day patch  nicotine polacrilex 4 mg gum         Return Appointments and Scheduled Appointments     Scheduled appointments:      May 02, 2024 3:00 PM  Telehealth visit with Rexene Mantel, RD  Gastroenterology: [facility] [facility] (Internal Medicine) [address]  [address]  [place] [address]  [phone]     Sep 05, 2024 3:00 PM  (Arrive by 2:45 PM)  Lab visit with [facility]  Laboratory: [facility] [place]  [facility] (--) [address]  [place] [address]  [phone]     Sep 05, 2024 3:30 PM  (Arrive by 3:15 PM)  Office visit with Alm JINNY Orion, DO  Oncology: [facility] [place]  [facility] ([place] [facility]) [phone] [address]  [place] [address]  [phone]     Nov 15, 2024 9:00 AM  Telehealth visit with Daniel Bring, MD  Hepatology: [facility] ([facility] [place]) [address]  [address], [address]  [place]  [place] [address]  [phone]            Consults, Procedures, Diagnostics, Micro, Pathology   Consults: None  Surgical Procedures & Dates: None  Significant Diagnostic Studies, Micro and Procedures: none  Significant Pathology: none     Wound:      Wounds Surgical incision Anterior;Right Toe, First (big) (Active)   04/15/24 1019   Wound Type: Surgical incision   Orientation: Anterior;Right   Location: Toe, First (big)   Wound Location Comments:    Initial Wound Site Closure: Other (Comment)   Initial Dressing Placed:    Initial Cycle:    Initial Suction Setting (mmHg):    Pressure Injury Stages:    Pressure Injury Present Within 24 Hours of Hospital Admission:    If This Pressure Injury Is Suspected to Be Device Related, Please Select the Device::    Is the Wound Open or Closed:    Image   04/15/24 1000   Wound Assessment Pink 04/15/24 1000   Wound Drainage Amount Scant 04/15/24 1000   Wound Drainage Description Green;Yellow 04/15/24 1000   Wound Dressing Status None/open to air 04/15/24 1000   Number of days: 4 Discharge Disposition, Condition   Patient Disposition: Home or Self Care [01]  Condition at Discharge: Stable    Code Status   Full Code    Patient Instructions     Activity       Activity as Tolerated   As directed      It is important to keep increasing your activity level after you leave the hospital.  Moving around can help prevent blood clots, lung infection (pneumonia) and other problems.  Gradually increasing the number of times you are up moving around will help you return to your normal activity level more quickly.  Continue to increase the number of times you are up to the chair and walking daily to return to your normal activity level. Begin to work toward your normal activity level at discharge          Diet       Regular Diet   As directed      You have no dietary restriction. Please continue with a healthy balanced diet.             Discharge education provided to patient., Signs and Symptoms:   Report these signs and symptoms       Report These Signs and Symptoms   As directed      Please contact your doctor if you have any of the following symptoms: temperature higher than 100.4 degrees F, uncontrolled pain, persistent nausea and/or vomiting, difficulty breathing, chest pain, severe abdominal pain, headache, unable to urinate, unable to have bowel movement, drainage with a foul odor, or Suicidal thoughts, homicidal thoughts, or auditory/visual hallucinations        , Education: , and Others Instructions:   Other Orders       Questions About Your Stay   Complete by: As directed      Discharging attending physician: LINNIE MARSA MATSU    Order comments: For questions or concerns regarding your hospital stay, call [phone].  Additional Orders: Case Management, Supplies, Home Health     Home Health/DME       None          Signed:  Jadon H Beck  04/19/2024      cc:  Primary Care Physician:  Bette Setter   Verified    Referring physicians:  No ref. provider found Additional provider(s):        Did we miss something? If additional records are needed, please fax a request on office letterhead to [phone]. Please include the patient's name, date of birth, fax number and type of information needed. Additional request can be made by email at [email or web address]. For general questions of information about electronic records sharing, call [phone].

## 2024-04-19 NOTE — Group Note [94]
 Name: Alan [name]   MRN: [id number]     DOB: 05/01/1981      Age: 43 y.o.  Admission Date: 04/14/2024     LOS: 4 days     Date of Service: 04/19/2024      Group Topic: BH Effective Communication and Conflict Management  Group Date: 04/18/2024  Start Time: 1115  End Time: 1205  Facilitators: Verne Kent          Number of Participants: 10  Group Focus: other How to deal effectively with difficult people   Treatment Modality: Group Psychotherapy, Psychoeducation, and Solution-Focused Therapy  Interventions utilized were clarification, exploration, patient education, and support  Purpose: enhance coping skills, improve communication skills, and increase insight        Name: Alan [name] Date of Birth: Mar 14, 1981   MR: [id number]      Level of Participation: active   Quality of Participation: attentive, cooperative, engaged, initiates communication, and supportive  Interactions with others: gave feedback  Mood/Affect: appropriate  Triggers (if applicable): NA  Cognition: coherent/clear and logical  Progress: Significant  Response: Patient was active during the group discussion and was supportive of his Peers  Plan: to continue treatment

## 2024-04-19 NOTE — Group Note [94]
 Name: Reagen Haberman   MRN: [id number]     DOB: 08-19-1980      Age: 43 y.o.  Admission Date: 04/14/2024     LOS: 4 days     Date of Service: 04/19/2024      Group Topic: BH Emotion Regulation (DBT Skill)  Group Date: 04/19/2024  Start Time: 1115  End Time: 1205  Facilitators: Melanee Herring          Number of Participants: 8  Group Focus: communication, feeling awareness/expression, and social skills  Treatment Modality: Drama Therapy  Interventions utilized were Group Count, Group Mood, and Emotional Reactions  Purpose: express feelings and improve communication skills    Patients warmed-up with an activity called ?Group Count,? where they worked together to try and count to 20 without saying the same number at the same time. This game focuses on teamwork, listening, and group mindfulness. Next, patients engaged in an intervention called ?Group Mood,? where one patient guesses what emotion the other patients were acting out. This activity focuses on emotional expression and communication skills. Patients ended group with an intervention called ?Emotional Reactions,? where they spun a wheel to determine an emotion for each round. Each group member than drew a playing card numbers A-5 to determine what level they would express that emotion as they said the line ?what's up.? This activity focuses on emotional expression, emotional regulation, and cognitive flexibility.     Name: Bralen Date of Birth: [birth year]   MR: [id number]      Level of Participation: patient not present for group   Plan: to continue treatment

## 2024-04-19 NOTE — Care Plan [600008]
 Problem: Elopement Prevention  Goal: Patients without Decisional Capacity will not elope from the care setting  04/19/2024 1219 by Teddi Craze, RN  Outcome: Goal Achieved  04/19/2024 1024 by Teddi Craze, RN  Outcome: Goal Ongoing     Problem: Harm to self, high risk of suicide  Goal: Absence of Harm to Self  04/19/2024 1219 by Teddi Craze, RN  Outcome: Goal Achieved  04/19/2024 1024 by Teddi Craze, RN  Outcome: Goal Ongoing     Problem: Glucose Management  Goal: Absence of hyperglycemia  04/19/2024 1219 by Teddi Craze, RN  Outcome: Goal Achieved  04/19/2024 1024 by Teddi Craze, RN  Outcome: Goal Ongoing  Goal: Absence of Hypoglycemia  04/19/2024 1219 by Teddi Craze, RN  Outcome: Goal Achieved  04/19/2024 1024 by Teddi Craze, RN  Outcome: Goal Ongoing  Goal: Glucose level within specified parameters  04/19/2024 1219 by Teddi Craze, RN  Outcome: Goal Achieved  04/19/2024 1024 by Teddi Craze, RN  Outcome: Goal Ongoing     Problem: Discharge Planning  Goal: Participation in plan of care  04/19/2024 1219 by Teddi Craze, RN  Outcome: Goal Achieved  04/19/2024 1024 by Teddi Craze, RN  Outcome: Goal Ongoing  Goal: Knowledge regarding plan of care  04/19/2024 1219 by Teddi Craze, RN  Outcome: Goal Achieved  04/19/2024 1024 by Teddi Craze, RN  Outcome: Goal Ongoing  Goal: Prepared for discharge  04/19/2024 1219 by Teddi Craze, RN  Outcome: Goal Achieved  04/19/2024 1024 by Teddi Craze, RN  Outcome: Goal Ongoing     Problem: Health Maintenance - Impaired  Goal: Able to perfom ADL's  04/19/2024 1219 by Teddi Craze, RN  Outcome: Goal Achieved  04/19/2024 1024 by Teddi Craze, RN  Outcome: Goal Ongoing  Goal: Adequate nutritional intake  04/19/2024 1219 by Teddi Craze, RN  Outcome: Goal Achieved  04/19/2024 1024 by Teddi Craze, RN  Outcome: Goal Ongoing  Goal: Establish therapeutic relationships  04/19/2024 1219 by Teddi Craze, RN  Outcome: Goal Achieved  04/19/2024 1024 by Teddi Craze, RN  Outcome: Goal Ongoing  Goal: Knowledge of health maintenance  04/19/2024 1219 by Teddi Craze, RN  Outcome: Goal Achieved  04/19/2024 1024 by Teddi Craze, RN  Outcome: Goal Ongoing     Problem: Mood - Altered  Goal: Stabilize mood  04/19/2024 1219 by Teddi Craze, RN  Outcome: Goal Achieved  04/19/2024 1024 by Teddi Craze, RN  Outcome: Goal Ongoing  Goal: Knowledge of Altered Mood  04/19/2024 1219 by Teddi Craze, RN  Outcome: Goal Achieved  04/19/2024 1024 by Teddi Craze, RN  Outcome: Goal Ongoing     Problem: Self-esteem - Low  Goal: Demonstration of positive self-esteem  04/19/2024 1219 by Teddi Craze, RN  Outcome: Goal Achieved  04/19/2024 1024 by Teddi Craze, RN  Outcome: Goal Ongoing  Goal: Knowledge of low self-esteem  04/19/2024 1219 by Teddi Craze, RN  Outcome: Goal Achieved  04/19/2024 1024 by Teddi Craze, RN  Outcome: Goal Ongoing     Problem: Thought Process - Altered  Goal: Demonstration of organized thought processes  04/19/2024 1219 by Teddi Craze, RN  Outcome: Goal Achieved  04/19/2024 1024 by Teddi Craze, RN  Outcome: Goal Ongoing  Goal: Knowledge of altered thought process  04/19/2024 1219 by Teddi Craze, RN  Outcome: Goal Achieved  04/19/2024 1024 by Teddi Craze, RN  Outcome: Goal Ongoing     Problem: Violence, self/other-directed, Risk of  Goal: Absence of violence  04/19/2024 1219 by Teddi Craze, RN  Outcome: Goal Achieved  04/19/2024  1024 by Teddi Craze, RN  Outcome: Goal Ongoing  Goal: Knowledge of risk for violence, self/other-directed  04/19/2024 1219 by Teddi Craze, RN  Outcome: Goal Achieved  04/19/2024 1024 by Teddi Craze, RN  Outcome: Goal Ongoing     Problem: Transition Readiness  Goal: Knowledge of transition readiness  04/19/2024 1219 by Teddi Craze, RN  Outcome: Goal Achieved  04/19/2024 1024 by Teddi Craze, RN  Outcome: Goal Ongoing     Problem: Cognitive-Perceptual Pattern - Impaired  Goal: Demonstration of organized thought processes  04/19/2024 1219 by Teddi Craze, RN  Outcome: Goal Achieved  04/19/2024 1024 by Teddi Craze, RN  Outcome: Goal Ongoing  Goal: Knowledge of prescribed medication  04/19/2024 1219 by Teddi Craze, RN  Outcome: Goal Achieved  04/19/2024 1024 by Teddi Craze, RN  Outcome: Goal Ongoing     Problem: Coping - Ineffective, Family  Goal: Effective Coping  04/19/2024 1219 by Teddi Craze, RN  Outcome: Goal Achieved  04/19/2024 1024 by Teddi Craze, RN  Outcome: Goal Ongoing  Goal: Communicate with support staff  04/19/2024 1219 by Teddi Craze, RN  Outcome: Goal Achieved  04/19/2024 1024 by Teddi Craze, RN  Outcome: Goal Ongoing  Goal: Knowledge of positive coping methods  04/19/2024 1219 by Teddi Craze, RN  Outcome: Goal Achieved  04/19/2024 1024 by Teddi Craze, RN  Outcome: Goal Ongoing     Problem: Social Interaction - Impaired  Goal: Increased Social Interaction  04/19/2024 1219 by Teddi Craze, RN  Outcome: Goal Achieved  04/19/2024 1024 by Teddi Craze, RN  Outcome: Goal Ongoing  Goal: Knowledge of social interaction  04/19/2024 1219 by Teddi Craze, RN  Outcome: Goal Achieved  04/19/2024 1024 by Teddi Craze, RN  Outcome: Goal Ongoing     Problem: Moderate Fall Risk  Goal: Moderate Fall Risk  04/19/2024 1219 by Teddi Craze, RN  Outcome: Goal Achieved  04/19/2024 1024 by Teddi Craze, RN  Outcome: Goal Ongoing     Problem: High Fall Risk  Goal: High Fall Risk  04/19/2024 1219 by Teddi Craze, RN  Outcome: Goal Achieved  04/19/2024 1024 by Teddi Craze, RN  Outcome: Goal Ongoing     Problem: Pain  Goal: Management of pain  04/19/2024 1219 by Teddi Craze, RN  Outcome: Goal Achieved  04/19/2024 1024 by Teddi Craze, RN  Outcome: Goal Ongoing  Goal: Knowledge of pain management  04/19/2024 1219 by Teddi Craze, RN  Outcome: Goal Achieved  04/19/2024 1024 by Teddi Craze, RN  Outcome: Goal Ongoing  Goal: Progress Toward Pain Management Goals  04/19/2024 1219 by Teddi Craze, RN  Outcome: Goal Achieved  04/19/2024 1024 by Teddi Craze, RN  Outcome: Goal Ongoing     Problem: Tobacco Use  Goal: Knowledge of tobacco-use cessation methods  04/19/2024 1219 by Teddi Craze, RN  Outcome: Goal Achieved  04/19/2024 1024 by Teddi Craze, RN  Outcome: Goal Ongoing

## 2024-04-21 ENCOUNTER — Encounter: Admit: 2024-04-21 | Discharge: 2024-04-21 | Payer: PRIVATE HEALTH INSURANCE

## 2024-04-24 ENCOUNTER — Encounter: Admit: 2024-04-24 | Discharge: 2024-04-24 | Payer: PRIVATE HEALTH INSURANCE

## 2024-04-25 ENCOUNTER — Encounter: Admit: 2024-04-25 | Discharge: 2024-04-25 | Payer: PRIVATE HEALTH INSURANCE

## 2024-04-26 ENCOUNTER — Encounter: Admit: 2024-04-26 | Discharge: 2024-04-26 | Payer: PRIVATE HEALTH INSURANCE

## 2024-05-02 ENCOUNTER — Encounter: Admit: 2024-05-02 | Discharge: 2024-05-02 | Payer: PRIVATE HEALTH INSURANCE

## 2024-05-03 ENCOUNTER — Encounter: Admit: 2024-05-03 | Discharge: 2024-05-03 | Payer: PRIVATE HEALTH INSURANCE

## 2024-05-03 DIAGNOSIS — R197 Diarrhea, unspecified: Principal | ICD-10-CM

## 2024-05-04 ENCOUNTER — Encounter: Admit: 2024-05-04 | Discharge: 2024-05-04 | Payer: PRIVATE HEALTH INSURANCE

## 2024-05-08 ENCOUNTER — Encounter: Admit: 2024-05-08 | Discharge: 2024-05-08 | Payer: PRIVATE HEALTH INSURANCE

## 2024-05-08 ENCOUNTER — Ambulatory Visit: Admit: 2024-05-08 | Discharge: 2024-05-08 | Payer: PRIVATE HEALTH INSURANCE

## 2024-05-08 MED ORDER — SUCCINYLCHOLINE CHLORIDE 20 MG/ML IJ SOLN
INTRAVENOUS | 0 refills | Status: DC
Start: 2024-05-08 — End: 2024-05-08

## 2024-05-08 MED ORDER — LACTATED RINGERS IV SOLP
INTRAVENOUS | 0 refills | Status: DC
Start: 2024-05-08 — End: 2024-05-08

## 2024-05-08 MED ORDER — PROPOFOL INJ 10 MG/ML IV VIAL
INTRAVENOUS | 0 refills | Status: DC
Start: 2024-05-08 — End: 2024-05-08

## 2024-05-08 MED ORDER — PROPOFOL 10 MG/ML IV EMUL 20 ML (INFUSION)(AM)(OR)
INTRAVENOUS | 0 refills | Status: DC
Start: 2024-05-08 — End: 2024-05-08
  Administered 2024-05-08: 20:00:00 150 ug/kg/min via INTRAVENOUS

## 2024-05-08 MED ORDER — DEXAMETHASONE SODIUM PHOSPHATE 4 MG/ML IJ SOLN
INTRAVENOUS | 0 refills | Status: DC
Start: 2024-05-08 — End: 2024-05-08

## 2024-05-08 MED ORDER — FENTANYL CITRATE (PF) 50 MCG/ML IJ SOLN
INTRAVENOUS | 0 refills | Status: DC
Start: 2024-05-08 — End: 2024-05-08

## 2024-05-08 MED ORDER — LIDOCAINE (PF) 20 MG/ML (2 %) IJ SOLN
INTRAVENOUS | 0 refills | Status: DC
Start: 2024-05-08 — End: 2024-05-08

## 2024-05-08 MED ORDER — ONDANSETRON HCL (PF) 4 MG/2 ML IJ SOLN
INTRAVENOUS | 0 refills | Status: DC
Start: 2024-05-08 — End: 2024-05-08

## 2024-05-08 MED ADMIN — INSULIN REGULAR HUMAN(#) 1 UNIT/ML IJ SYRINGE [211626]: 3 [IU] | INTRAVENOUS | @ 20:00:00 | Stop: 2024-05-08 | NDC 54029038702

## 2024-05-08 NOTE — Anesthesia Postprocedure Evaluation [25]
 Post-Anesthesia Evaluation    Name: Ethin Drummond      MRN: [id number]     DOB: 26-Jun-1981     Age: 43 y.o.     Sex: male   __________________________________________________________________________     Procedure Information       Anesthesia Start Date/Time: 05/08/24 1350    Procedures:       ESOPHAGOGASTRODUODENOSCOPY WITH BIOPSY - FLEXIBLE      DILATION, ESOPHAGUS, OVER GUIDE WIRE    Location: [facility] / [facility]GI    Surgeons: Chipper Neas, MD            Post-Anesthesia Vitals      Vitals Value Taken Time   BP 115/93 05/08/24 14:45   Temp 36.3 ?C (97.4 ?F) 05/08/24 14:45   Pulse 101 05/08/24 14:45   Respirations 20 PER MINUTE 05/08/24 14:45   SpO2 96 % 05/08/24 14:45   O2 Device None (Room air) 05/08/24 14:45   ABP     ART BP           Post Anesthesia Evaluation Note    Evaluation location: pre/post  Patient participation: recovered; patient participated in evaluation  Level of consciousness: alert    Pain score: 1  Pain management: adequate    Hydration: normovolemia  Temperature: 36.0?C - 38.4?C  Airway patency: adequate    Perioperative Events      Postoperative Status  Cardiovascular status: hemodynamically stable  Respiratory status: spontaneous ventilation        Perioperative Events  There were no known complications for this encounter.

## 2024-05-08 NOTE — Anesthesia Preprocedure Evaluation [24]
 Anesthesia Pre-Procedure Evaluation    Name: Alan [name]      MRN: [id number]     DOB: [birth year]     Age: 43 y.o.     Sex: male   _________________________________________________________________________     Procedure Info:   Procedure Information       Date/Time: 05/08/24 1310    Procedures:       ESOPHAGOGASTRODUODENOSCOPY WITH BIOPSY - FLEXIBLE      ESOPHAGOGASTRODUODENOSCOPY, WITH BALLOON DILATION OF LESS THAN 30 MILLIMETERS    Location: [facility] / ENDO[facility]GI    Surgeons: Chipper Neas, MD          Physical Assessment  Vital Signs (last filed in past 24 hours):  BP: 114/83 (11/10 1233)  Temp: 36.7 ?C (98 ?F) (11/10 1233)  Pulse: 88 (11/10 1234)  Respirations: 14 PER MINUTE (11/10 1234)  SpO2: 97 % (11/10 1234)  O2 Device: None (Room air) (11/10 1233)  Height: 175.3 cm (5' 9) (11/10 1233)  Weight: 95.7 kg (211 lb) (11/10 1233)      Patient History   Allergies   Allergen Reactions    Iodinated Contrast Media FLUSHING (SKIN) and REDNESS    Iodine  And Iodide Containing Products RASH    Azathioprine RASH    Chlorhexidine RASH    Amoxicillin-Pot Clavulanate UNKNOWN    Chlorphen-Phenyleph-Hydrocodon ITCHING    Gadolinium-Containing Contrast Media FLUSHING (SKIN)     Pt reported has had reaction in past x2 at other location. Pt reports was given benadryl  with 2nd reaction.       Oxycodone ITCHING        Current Medications   Medication Directions   cetirizine  (ZYRTEC ) 10 mg tablet Take one tablet by mouth daily. 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 Indications: anxiety   dapagliflozin  propanediol (FARXIGA ) 10 mg tablet Take one tablet by mouth every morning.   dicyclomine  (BENTYL ) 20 mg tablet Take one tablet by mouth four times daily as needed. diphenoxylate -atropine  (LOMOTIL ) 2.5-0.025 mg tablet TAKE 1 TABLET BY MOUTH FOUR TIMES A DAY AS NEEDED FOR DIARRHEA   ERGOcalciferoL  (vitamin D2) (DRISDOL ) 1,250 mcg (50,000 unit) capsule TAKE 1 CAPSULE BY MOUTH EVERY 7 DAYS FOR VITAMIN D  DEFICIENCY   eszopiclone (LUNESTA) 3 mg tablet Take one tablet by mouth at bedtime daily.   famotidine  (PEPCID ) 20 mg tablet Take one tablet by mouth as Needed.   fish oil - omega 3-DHA/EPA 300/1,000 mg capsule Take one capsule by mouth twice daily with meals. Indications: high amount of triglyceride in the blood   hydrOXYzine  HCL (ATARAX ) 25 mg tablet Take one tablet by mouth every 6 hours as needed. Indications: anxious   insulin  lispro (U-100) (HUMALOG KWIKPEN INSULIN ) 100 unit/mL subcutaneous PEN Inject eighteen Units under the skin three times daily with meals.   L-Methylfolate (DEPLIN 15) 15 mg tablet Take one tablet by mouth daily.   levothyroxine  (SYNTHROID ) 25 mcg tablet Take one tablet by mouth daily 30 minutes before breakfast.   linaCLOtide  (LINZESS ) 72 mcg capsule Take one capsule by mouth daily.   lipase -protease -amylase  (PANCREAZE ) 37,000-97,300- 149,900 unit capsule Take three capsules by mouth three times daily with meals AND two capsules with snacks. Indications: exocrine pancreatic insufficiency   methocarbamoL (ROBAXIN) 750 mg  tablet Take one tablet by mouth every 8 hours as needed for Muscle Cramps or Spasms. Indications: muscle spasm   mirtazapine (REMERON) 15 mg tablet Take one tablet by mouth at bedtime daily. Indications: posttraumatic stress syndrome   nicotine (NICODERM CQ) 21 mg/day patch Apply one patch to top of skin as directed daily. Rotate patch location.  Indications: stop smoking   nicotine polacrilex (NICORETTE) 4 mg gum Place one Gum inside cheek (side of mouth) every 2 hours as needed. Chew to soften and park in mouth between lip and gum. May use 1 piece per hour, not to exceed 24 per day for 12 weeks. May be used longer, if needed. Indications: stop smoking   NIFEDIPINE /LIDOCAINE  0.3%/1.5% IN PETROLATUM OINTMENT (BATCHED COMPOUND) Apply to anal area three times daily as needed   nortriptyline  (PAMELOR ) 50 mg capsule Take one capsule by mouth at bedtime daily.   ondansetron  (ZOFRAN  ODT) 4 mg rapid dissolve tablet Dissolve one tablet by mouth every 6 hours as needed. Place on tongue to dissolve.  Indications: nausea   pantoprazole  DR (PROTONIX ) 40 mg tablet Take one tablet by mouth twice daily.   risankizumab -rzaa (SKYRIZI ) 360 mg/2.4 mL (150 mg/mL) wearable injector Inject 2.4 mL under the skin every 8 weeks. Indications: Crohn's disease   rosuvastatin  (CRESTOR ) 40 mg tablet Take one tablet by mouth twice weekly. Hold until follow up with hepatology  Indications: excessive fat in the blood   TRESIBA FLEXTOUCH U-200 200 unit/mL (3 mL) injection PEN Inject fifty Units under the skin at bedtime daily.   VASCEPA 1 gram capsule TAKE 2 CAPSULES BY MOUTH TWICE A DAY   vilazodone (VIIBRYD) 40 mg tablet Take one tablet by mouth daily with dinner. Indications: major depressive disorder       Review of Systems/Medical History      Patient summary reviewed  Pertinent labs reviewed      No history of anesthetic complications  No family history of anesthetic complications      Airway       Difficult airway (required VL):      Pulmonary       Current smoker (1 ppd); patient smoked on day of surgery          tobacco use        No indications/hx of asthma (allergies)      Cardiovascular       Recent diagnostic studies:          echocardiogram          05/2023 TTE  ? LVEF=60-65%.  ? Normal left ventricular size and function.  ? No regional wall motion abnormalities.  ? Right ventricular size and function are normal.  ? Normal LA volume index.  Normal RA area.   ? The aortic and pulmonic valves were not well visualized.  ? No significant valvular stenosis or regurgitation by Doppler studies.  ? Trivial pericardial effusion without hemodynamic compromise. ? Normal central venous pressure (0-5 mm Hg).  ? Unable to accurately estimate PA systolic pressure.      There are no previous studies available for comparison.        Exercise tolerance: >4 METS      Beta Blocker therapy: No      Beta blockers within 24 hours: n/a      Hypertension, well controlled          No past MI      No coronary artery bypass graft  No PTCA          Hyperlipidemia      GI/Hepatic/Renal       Inflammatory bowel disease (Crohn's)          GERD        Liver disease (c/f  drug-induced liver injury especially from Augmentin per hepatology):        Pancreatitis (chronic)       No renal disease:         No nausea      No vomiting        Dysphagia; hx esophageal stenosis            Neuro/Psych       No seizures        No CVA      Headaches      Chronic benzodiazepine use        Psychiatric history (Panic Attacks)          Depression          Anxiety      Syncope x 2 times. Full workup by cardiology and neurology came back normal, per patient.          Endocrine/Other       Diabetes, type 2      Most recent Hgb A1C:> 9        Hypothyroidism      No anemia      Blood dyscrasia (polycythemia)        Obesity      Constitution - negative       Physical Exam    Airway Findings      Mallampati: III      TM distance: <3 FB      Neck ROM: full      Mouth opening: good      Airway patency: adequate    Dental Findings:         Comments: Intact upper L incisor cap    Cardiovascular Findings:       Rhythm: regular      Rate: normal      No murmur    Pulmonary Findings:       Breath sounds clear to auscultation. No decreased breath sounds.    Abdominal Findings:       Obese    Neurological Findings:       Alert and oriented x 3    Normal mental status    Constitutional findings:       No acute distress       Previous Airway Procedure Notes Displaying the 3 most recent records      Date Mask Difficulty Techninque used for succesful ETT placement Blade Type Blade Size View with successful placement Number of attempts    06/02/23 0 - not attempted video laryngoscopy Glide Scope 4 grade I - full view of glottis 3 or more            Patient Lines/Drains/Airways Status       Active Lines:       Name Placement date Placement time Site Days    Peripheral IV 05/08/24 1243 Right Posterior Hand 20 G 05/08/24  1243  -- less than 1                  Diagnostic Tests  Hematology:   Lab Results   Component Value Date    HGB 16.3 04/14/2024    HGB 17.1 01/10/2024    HCT 46.7 04/14/2024  HCT 49.8 01/10/2024    PLTCT 299 04/14/2024    PLTCT 260 01/10/2024    WBC 12.40 04/14/2024    WBC 14.75 01/10/2024    NEUT 53.2 04/14/2024    NEUT 65.4 01/10/2024    ANC 6.60 04/14/2024    ANC 9.63 01/10/2024    LYMPH 31 10/14/2023    ALC 4.10 04/14/2024    ALC 3.13 01/10/2024    MONA 6.7 04/14/2024    MONA 5.7 01/10/2024    AMC 0.80 04/14/2024    AMC 0.84 01/10/2024    EOSA 6.1 04/14/2024    EOSA 4 06/03/2021    ABC 0.10 04/14/2024    ABC 0.14 01/10/2024    BASOPHILS 1 06/15/2023    MCV 86.8 04/14/2024    MCV 85.9 01/10/2024    MCH 30.3 04/14/2024    MCH 29.5 01/10/2024    MCHC 34.9 04/14/2024    MCHC 34.3 01/10/2024    MPV 6.8 04/14/2024    MPV 8.6 01/10/2024    RDW 15.2 04/14/2024    RDW 14.9 01/10/2024         General Chemistry:   Lab Results   Component Value Date    NA 138 04/14/2024    NA 136 02/10/2024    K 3.6 04/14/2024    K 4.0 02/10/2024    CL 99 04/14/2024    CL 108 02/10/2024    CO2 28 04/14/2024    CO2 22.0 02/10/2024    GAP 11 04/14/2024    GAP 6 02/10/2024    BUN 6 04/14/2024    BUN 9.9 02/10/2024    CR 0.70 04/14/2024    CR 0.63 02/10/2024    GLU 144 04/14/2024    GLU 262 02/10/2024    GLU 312 08/19/2023    CA 9.8 04/14/2024    CA 8.9 02/10/2024    ALBUMIN 4.2 04/14/2024    ALBUMIN 3.5 02/10/2024    MG 1.9 12/17/2020    TOTBILI 0.5 04/14/2024    TOTBILI 0.44 02/10/2024      Coagulation:   Lab Results   Component Value Date    PT 11.4 06/14/2023    PT 10.5 12/17/2020    PTT 31.4 12/17/2020    INR 1.0 06/14/2023    INR 0.9 12/17/2020         PAC Plan    Anesthesia Plan    ASA score: 3   Plan: MAC  NPO status: acceptable      Informed Consent  Anesthetic plan and risks discussed with patient.  Use of blood products discussed with patient  Blood Consent: consented      Plan discussed with: anesthesiologist and CRNA.  Comments: (Glide if needed )      Alerts: Yes  Difficult airway (required VL):

## 2024-05-08 NOTE — Anesthesia Procedure Notes [28]
 Procedure: Airway Placement    AIRWAY INSERTION    Date/Time: 05/08/2024 2:06 PM          Patient location: OR  Urgency: elective  Difficult Airway: No        Airway Procedure  Indication(s) for airway management: surgery        Rapid Sequence Induction (RSI) used: yes    Preoxygenated: yes  Patient position: sniffing    Mask difficulty assessment: 0 - not attempted      Procedure Outcome  Final airway type: endotracheal airway  Endotracheal airway: ETT          ETT size (mm): 7.5  Technique used for successful ETT placement: video laryngoscopy  Devices/methods used in placement: intubating stylet and cricoid pressure  Insertion site: oral  Blade type: Glide Scope   Laryngoscope/Videolaryngoscope blade size: 4 (Hyperangle)  Cormack-Lehane classification: grade I - full view of glottis      Measured from: teeth   Depth: 23 cm  Amount of Air in Cuff: 8 ml  Number of attempts at approach: 1  Placement verified by auscultation and capnometry          Additional notes: Atraumatic insertion, dentition and mucosa unchanged.      Performed by: Fanchon Papania Yun, CRNA  Authorized by: Malena Joen BIRCH, MD

## 2024-05-10 ENCOUNTER — Encounter: Admit: 2024-05-10 | Discharge: 2024-05-10 | Payer: PRIVATE HEALTH INSURANCE

## 2024-05-10 NOTE — Progress Notes [1]
 Pharmacy Medication Reassessment      Indication/Regimen  The regimen of SKYRIZI  SC indefinitely is appropriate for Alan [name] who has Crohn's disease involving terminal ileum (CMS-HCC).    Renal dose adjustments are not required. Hepatic dose adjustments are not required. Dose titration is required. The following dose titration is planned: 600mg  IV at week 0, 4, 8 then transition to maintenance dosing. Alan [name] started taking the medication on 01/10/2024.    The patient has the ability to self-administer the medication(s).      Baseline Characteristics  Disease severity: moderate-severe  Disease description: small bowel  Medications being used concurrently: Skyrizi   Previously trialed agents: Stelara , cimzia , azathioprine, steroids  Allergy and/or intolerance to IBD medications: azathioprine  Co-morbid conditions: None  Additional considerations: None  Baseline endoscopy: Colonoscopy 02/2022 showed few scattered small aphthous erosions were found in the distal ileum  Baseline pathology: Pathology 02/2022 showed Ileal mucosa with focal active ileitis.    Therapeutic Goals and Monitoring  The goal of therapy is to control symptoms and achieve remission.    Disease activity (patient reported): occasionally active  Patient reported improvement: 25-49 percent improvement in symptoms since starting therapy  Clinical symptoms: blood in stool and diarrhea  Steroid course in past 90 days: no  Hospitalization visit for IBD (past 90 days): 0  ED visit for IBD (past 90 days): 0  Flare (past 90 days): no  Patient is not currently in clinical remission. Patient reports doing well until a week ago when he started experieincing diarrhea with blood in stool (provider aware), otherwise symptoms have since normalized    Endoscopic Evaluation  Endoscopic procedure completed: no (no updated endoscopic procedure)    Histologic Evaluation  Test completed: no (no updated biopsy)    Evaluation  The patient is making progress toward achieving their therapeutic goal. The plan is to continue current therapy.      Past Medical History and Comorbidities  Patient Active Problem List   Diagnosis    Crohn's disease involving terminal ileum (CMS-HCC)    Moderate episode of recurrent major depressive disorder (CMS-HCC)    Generalized anxiety disorder with panic attacks    Leukocytosis    Elevated LFTs    Difficult airway    Other chronic pancreatitis (CMS-HCC)    Suicidal ideation    Adjustment disorder    PTSD (post-traumatic stress disorder)     Additional comorbidities: no      Labs and Diagnostic Tests  Lab Results   Component Value Date/Time    WBC 12.40 (H) 04/14/2024 09:09 PM    RBC 5.38 04/14/2024 09:09 PM    HGB 16.3 04/14/2024 09:09 PM    HCT 46.7 04/14/2024 09:09 PM    MCV 86.8 04/14/2024 09:09 PM    MCH 30.3 04/14/2024 09:09 PM    MCHC 34.9 04/14/2024 09:09 PM    RDW 15.2 (H) 04/14/2024 09:09 PM    PLTCT 299 04/14/2024 09:09 PM    MPV 6.8 (L) 04/14/2024 09:09 PM       Lab Results   Component Value Date/Time    NEUT 53.2 04/14/2024 09:09 PM    ANC 6.60 04/14/2024 09:09 PM    LYMA 32.9 04/14/2024 09:09 PM    ALC 4.10 04/14/2024 09:09 PM    MONA 6.7 04/14/2024 09:09 PM    AMC 0.80 04/14/2024 09:09 PM    EOSA 6.1 (H) 04/14/2024 09:09 PM    AEC 0.80 (H) 04/14/2024 09:09 PM    BASA 1.1 04/14/2024  09:09 PM    ABC 0.10 04/14/2024 09:09 PM       Lab Results   Component Value Date/Time    NA 138 04/14/2024 09:09 PM    K 3.6 04/14/2024 09:09 PM    CL 99 04/14/2024 09:09 PM    CO2 28 04/14/2024 09:09 PM    GAP 11 04/14/2024 09:09 PM    BUN 6 (L) 04/14/2024 09:09 PM    CR 0.70 04/14/2024 09:09 PM    GLU 144 (H) 04/14/2024 09:09 PM       Lab Results   Component Value Date/Time    CA 9.8 04/14/2024 09:09 PM    ALBUMIN 4.2 04/14/2024 09:09 PM    TOTPROT 7.1 04/14/2024 09:09 PM    ALKPHOS 77 04/14/2024 09:09 PM    AST 20 04/14/2024 09:09 PM    ALT 21 04/14/2024 09:09 PM    TOTBILI 0.5 04/14/2024 09:09 PM    GFR >60 04/14/2024 09:09 PM Lab Results   Component Value Date/Time    HEPBSAG Non-reactive 06/02/2023 02:12 AM       No results found for: TSPOTTB, QUANTIFERTB, INTERPQUAN      Allergies  Allergies[1]     Immunizations  Vaccine history was reviewed with the patient. Education was provided on the importance of completing vaccines. The patient should avoid live vaccines.    Immunization History   Administered Date(s) Administered    COVID-19 (PFIZER), mRNA vacc, 30 mcg/0.3 mL (PF) 02/28/2020       Medication Reconciliation  Medication history and reconciliation were performed (including prescription medications, supplements, over the counter, and herbal products). The medication list was updated and the patient's current medication list is included. The patient was instructed to speak with their health care provider before starting any new drug, including prescription or over the counter, natural / herbal products, or vitamins.    Drug Interactions    Drug-Drug Interactions  Drug-drug interactions were evaluated. There were not clinically significant drug-drug interactions.     Drug-Food Interactions  Drug-food interactions were not evaluated (NA - not oral).      Home Medications   Medication Sig   cetirizine  (ZYRTEC ) 10 mg tablet Take one tablet by mouth daily. 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 Indications: anxiety   dapagliflozin  propanediol (FARXIGA ) 10 mg tablet Take one tablet by mouth every morning.   dicyclomine  (BENTYL ) 20 mg tablet Take one tablet by mouth four times daily as needed.   diphenoxylate -atropine  (LOMOTIL ) 2.5-0.025 mg tablet TAKE 1 TABLET BY MOUTH FOUR TIMES A DAY AS NEEDED FOR DIARRHEA   ERGOcalciferoL  (vitamin D2) (DRISDOL ) 1,250 mcg (50,000 unit) capsule TAKE 1 CAPSULE BY MOUTH EVERY 7 DAYS FOR VITAMIN D  DEFICIENCY   eszopiclone (LUNESTA) 3 mg tablet Take one tablet by mouth at bedtime daily.   famotidine  (PEPCID ) 20 mg tablet Take one tablet by mouth as Needed.   fish oil - omega 3-DHA/EPA 300/1,000 mg capsule Take one capsule by mouth twice daily with meals. Indications: high amount of triglyceride in the blood   hydrOXYzine  HCL (ATARAX ) 25 mg tablet Take one tablet by mouth every 6 hours as needed. Indications: anxious   insulin  lispro (U-100) (HUMALOG KWIKPEN INSULIN ) 100 unit/mL subcutaneous PEN Inject eighteen  Units under the skin three times daily with meals.   L-Methylfolate (DEPLIN 15) 15 mg tablet Take one tablet by mouth daily.   levothyroxine  (SYNTHROID ) 25 mcg tablet Take one tablet by mouth daily 30 minutes before breakfast.   linaCLOtide  (LINZESS ) 72 mcg capsule Take one capsule by mouth daily.   lipase -protease -amylase  (PANCREAZE ) 37,000-97,300- 149,900 unit capsule Take three capsules by mouth three times daily with meals AND two capsules with snacks. Indications: exocrine pancreatic insufficiency   methocarbamoL (ROBAXIN) 750 mg tablet Take one tablet by mouth every 8 hours as needed for Muscle Cramps or Spasms. Indications: muscle spasm   mirtazapine (REMERON) 15 mg tablet Take one tablet by mouth at bedtime daily. Indications: posttraumatic stress syndrome   nicotine (NICODERM CQ) 21 mg/day patch Apply one patch to top of skin as directed daily. Rotate patch location.  Indications: stop smoking   nicotine polacrilex (NICORETTE) 4 mg gum Place one Gum inside cheek (side of mouth) every 2 hours as needed. Chew to soften and park in mouth between lip and gum. May use 1 piece per hour, not to exceed 24 per day for 12 weeks. May be used longer, if needed.  Indications: stop smoking   NIFEDIPINE /LIDOCAINE  0.3%/1.5% IN PETROLATUM OINTMENT (BATCHED COMPOUND) Apply to anal area three times daily as needed   nortriptyline  (PAMELOR ) 50 mg capsule Take one capsule by mouth at bedtime daily.   ondansetron  (ZOFRAN  ODT) 4 mg rapid dissolve tablet Dissolve one tablet by mouth every 6 hours as needed. Place on tongue to dissolve.  Indications: nausea   pantoprazole  DR (PROTONIX ) 40 mg tablet Take one tablet by mouth twice daily.   risankizumab -rzaa (SKYRIZI ) 360 mg/2.4 mL (150 mg/mL) wearable injector Inject 2.4 mL under the skin every 8 weeks. Indications: Crohn's disease   rosuvastatin  (CRESTOR ) 40 mg tablet Take one tablet by mouth twice weekly. Hold until follow up with hepatology  Indications: excessive fat in the blood   TRESIBA FLEXTOUCH U-200 200 unit/mL (3 mL) injection PEN Inject fifty Units under the skin at bedtime daily.   VASCEPA 1 gram capsule TAKE 2 CAPSULES BY MOUTH TWICE A DAY   vilazodone (VIIBRYD) 40 mg tablet Take one tablet by mouth daily with dinner. Indications: major depressive disorder       Adverse Drug Reactions  Adverse drug reactions were reviewed with the patient.    Significant adverse drug reaction(s) were not identified.    Side effect(s) were not reported.    The patient does not have injection related concerns.    The patient does not have infection related concerns.      Adherence  Refill and adherence history were reviewed with the patient. The patient was educated on the importance of adherence.    Patient is adherent with refills: yes  Patient is meeting refill adherence goal: yes    Patient reported 0 missed doses over the past 30 days.  Significance of missed doses: NA - no missed doses   Patient is meeting reported adherence goal.      Safety Precautions    Risk Evaluation and Mitigation (REMS) Assessment: REMS is not required for this medication.    Safety precautions were addressed and discussed with the patient as applicable.    Contraindications: Cordarrell Sane does not have contraindications to this medication.      Pregnancy Status: Male, education not applicable.      Medication Education  Counseling was not completed because patient was previously educated and did not require additional  counseling.    Alan [name] was given the opportunity to ask questions. Patient did not have any questions at this time. Patient was reminded of the refill process and encouraged to call with questions. The monitoring and follow-up plan was discussed with the patient. The patient was instructed to contact their health care provider if their symptoms or health problems do not get better or if they become worse. For clinical questions about this medication, the pharmacist can be reached at [phone]. For questions about cost, insurance coverage, or to obtain refills, the patient should contact the pharmacy via MyChart or by calling [phone]. The patient verbalized acceptance and understanding.    Follow-up Plan  The patient will be reassessed within 1 year.    Discussed option to fill at a [facility] pharmacy. The medication(s) will be received from an outside pharmacy ([facility]) based on insurance mandate.       Tinnie Baseman, PHARMD       [1]   Allergies  Allergen Reactions    Iodinated Contrast Media FLUSHING (SKIN) and REDNESS    Iodine  And Iodide Containing Products RASH    Azathioprine RASH    Chlorhexidine RASH    Amoxicillin-Pot Clavulanate UNKNOWN    Chlorphen-Phenyleph-Hydrocodon ITCHING    Gadolinium-Containing Contrast Media FLUSHING (SKIN)     Pt reported has had reaction in past x2 at other location. Pt reports was given benadryl  with 2nd reaction.       Oxycodone ITCHING

## 2024-05-22 ENCOUNTER — Encounter: Admit: 2024-05-22 | Discharge: 2024-05-22 | Payer: PRIVATE HEALTH INSURANCE

## 2024-06-07 ENCOUNTER — Encounter: Admit: 2024-06-07 | Discharge: 2024-06-07 | Payer: PRIVATE HEALTH INSURANCE

## 2024-06-08 ENCOUNTER — Encounter: Admit: 2024-06-08 | Discharge: 2024-06-08 | Payer: PRIVATE HEALTH INSURANCE

## 2024-06-09 ENCOUNTER — Encounter: Admit: 2024-06-09 | Discharge: 2024-06-09 | Payer: PRIVATE HEALTH INSURANCE

## 2024-06-09 DIAGNOSIS — K8681 Exocrine pancreatic insufficiency: Principal | ICD-10-CM

## 2024-06-09 MED ORDER — ZENPEP 40,000-126,000- 168,000 UNIT PO CPDR
2 | ORAL_CAPSULE | Freq: Three times a day (TID) | ORAL | 11 refills | 30.00000 days | Status: AC
Start: 2024-06-09 — End: ?

## 2024-06-13 ENCOUNTER — Encounter: Admit: 2024-06-13 | Discharge: 2024-06-13 | Payer: PRIVATE HEALTH INSURANCE

## 2024-06-19 ENCOUNTER — Encounter: Admit: 2024-06-19 | Discharge: 2024-06-19 | Payer: PRIVATE HEALTH INSURANCE

## 2024-06-21 ENCOUNTER — Emergency Department
Admit: 2024-06-21 | Discharge: 2024-06-22 | Disposition: A | Discharge status: Home / Self Care | Payer: PRIVATE HEALTH INSURANCE

## 2024-06-21 ENCOUNTER — Encounter: Admit: 2024-06-21 | Discharge: 2024-06-21 | Payer: PRIVATE HEALTH INSURANCE

## 2024-06-21 ENCOUNTER — Emergency Department: Admit: 2024-06-21 | Discharge: 2024-06-21 | Payer: PRIVATE HEALTH INSURANCE

## 2024-06-21 ENCOUNTER — Ambulatory Visit: Admit: 2024-06-21 | Discharge: 2024-06-21 | Payer: PRIVATE HEALTH INSURANCE

## 2024-06-27 ENCOUNTER — Encounter: Admit: 2024-06-27 | Discharge: 2024-06-27 | Payer: PRIVATE HEALTH INSURANCE

## 2024-06-27 NOTE — Telephone Encounter [36]
 Sent a [facility] text advising:    [facility], please be advised your Nov 15, 2024, telehealth appointment with Dr. Abigail has been cancelled as he will be out of the office that day. Your telehealth appointment has been rescheduled for Monday, Nov 13, 2024, at 9:30am, with Asberry Novak, APRN. Thank you.

## 2024-07-07 ENCOUNTER — Encounter: Admit: 2024-07-07 | Discharge: 2024-07-07 | Payer: PRIVATE HEALTH INSURANCE

## 2024-07-11 ENCOUNTER — Encounter: Admit: 2024-07-11 | Discharge: 2024-07-11 | Payer: PRIVATE HEALTH INSURANCE

## 2024-07-11 DIAGNOSIS — R109 Unspecified abdominal pain: Principal | ICD-10-CM

## 2024-07-11 MED ORDER — HYOSCYAMINE SULFATE 0.375 MG PO TB12
.375 mg | ORAL_TABLET | Freq: Two times a day (BID) | ORAL | 11 refills | 5.00000 days | Status: AC | PRN
Start: 2024-07-11 — End: ?

## 2024-07-18 ENCOUNTER — Encounter: Admit: 2024-07-18 | Discharge: 2024-07-18 | Payer: PRIVATE HEALTH INSURANCE

## 2024-07-18 DIAGNOSIS — K649 Unspecified hemorrhoids: Principal | ICD-10-CM

## 2024-07-18 MED ORDER — HYDROCORTISONE-PRAMOXINE 1-1 % RE CREA
1 g | Freq: Two times a day (BID) | RECTAL | 5 refills | 50.00000 days | Status: AC
Start: 2024-07-18 — End: ?

## 2024-07-19 ENCOUNTER — Encounter: Admit: 2024-07-19 | Discharge: 2024-07-19 | Payer: PRIVATE HEALTH INSURANCE

## 2024-07-19 DIAGNOSIS — K5000 Crohn's disease of small intestine without complications: Principal | ICD-10-CM

## 2024-07-19 MED ORDER — SKYRIZI 360 MG/2.4 ML (150 MG/ML) SC INJT
SUBCUTANEOUS | 2 refills | 56.00000 days | Status: AC
Start: 2024-07-19 — End: ?

## 2024-07-19 NOTE — Telephone Encounter [36]
 Refill request received for:    SKYRIZI  360 mg/2.4 mL (150 mg/mL) wearable injector     Sig: INJECT 1 CARTRIDGE  SUBCUTANEOUSLY EVERY 8 WEEKS    Disp: 2.4 mL    Refills: 2 (Pharmacy requested: Not specified)     Last OV: 04/04/24  Last labs: 06/21/24    Routing to Dr. Enid for approval/refusal
